# Patient Record
Sex: Female | Born: 1995 | Race: White | Hispanic: No | Smoking: Never smoker
Health system: Southern US, Community
[De-identification: ages and names within clinical notes are randomized; demographics above are authoritative.]

## PROBLEM LIST (undated history)

## (undated) DIAGNOSIS — K219 Gastro-esophageal reflux disease without esophagitis: Secondary | ICD-10-CM

## (undated) DIAGNOSIS — I81 Portal vein thrombosis: Secondary | ICD-10-CM

## (undated) DIAGNOSIS — I820 Budd-Chiari syndrome: Secondary | ICD-10-CM

## (undated) DIAGNOSIS — Z638 Other specified problems related to primary support group: Secondary | ICD-10-CM

## (undated) HISTORY — PX: BACK SURGERY: SHX140

## (undated) HISTORY — PX: SPLENECTOMY, TOTAL: SHX788

---
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
## 2014-06-12 ENCOUNTER — Emergency Department (HOSPITAL_BASED_OUTPATIENT_CLINIC_OR_DEPARTMENT_OTHER): Payer: No Typology Code available for payment source

## 2014-06-12 ENCOUNTER — Encounter (HOSPITAL_BASED_OUTPATIENT_CLINIC_OR_DEPARTMENT_OTHER): Payer: Self-pay

## 2014-06-12 ENCOUNTER — Emergency Department (HOSPITAL_BASED_OUTPATIENT_CLINIC_OR_DEPARTMENT_OTHER)
Admission: EM | Admit: 2014-06-12 | Discharge: 2014-06-12 | Disposition: A | Discharge status: Home / Self Care | Payer: No Typology Code available for payment source | Attending: Emergency Medicine | Admitting: Emergency Medicine

## 2014-06-12 DIAGNOSIS — S060X0A Concussion without loss of consciousness, initial encounter: Secondary | ICD-10-CM | POA: Diagnosis not present

## 2014-06-12 DIAGNOSIS — Z79899 Other long term (current) drug therapy: Secondary | ICD-10-CM | POA: Insufficient documentation

## 2014-06-12 DIAGNOSIS — Z3202 Encounter for pregnancy test, result negative: Secondary | ICD-10-CM | POA: Diagnosis not present

## 2014-06-12 DIAGNOSIS — Y92410 Unspecified street and highway as the place of occurrence of the external cause: Secondary | ICD-10-CM | POA: Diagnosis not present

## 2014-06-12 DIAGNOSIS — Y9389 Activity, other specified: Secondary | ICD-10-CM | POA: Insufficient documentation

## 2014-06-12 DIAGNOSIS — R05 Cough: Secondary | ICD-10-CM | POA: Diagnosis not present

## 2014-06-12 DIAGNOSIS — Y998 Other external cause status: Secondary | ICD-10-CM | POA: Insufficient documentation

## 2014-06-12 DIAGNOSIS — Z7952 Long term (current) use of systemic steroids: Secondary | ICD-10-CM | POA: Diagnosis not present

## 2014-06-12 DIAGNOSIS — Z792 Long term (current) use of antibiotics: Secondary | ICD-10-CM | POA: Insufficient documentation

## 2014-06-12 DIAGNOSIS — S0990XA Unspecified injury of head, initial encounter: Secondary | ICD-10-CM | POA: Diagnosis present

## 2014-06-12 LAB — URINALYSIS, ROUTINE W REFLEX MICROSCOPIC
BILIRUBIN URINE: NEGATIVE
GLUCOSE, UA: NEGATIVE mg/dL
HGB URINE DIPSTICK: NEGATIVE
Ketones, ur: 15 mg/dL — AB
Leukocytes, UA: NEGATIVE
Nitrite: NEGATIVE
PROTEIN: NEGATIVE mg/dL
SPECIFIC GRAVITY, URINE: 1.031 — AB (ref 1.005–1.030)
UROBILINOGEN UA: 0.2 mg/dL (ref 0.0–1.0)
pH: 5.5 (ref 5.0–8.0)

## 2014-06-12 LAB — PREGNANCY, URINE: Preg Test, Ur: NEGATIVE
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
## 2014-06-12 NOTE — Discharge Instructions (Signed)
Concussion  A concussion, or closed-head injury, is a brain injury caused by a direct blow to the head or by a quick and sudden movement (jolt) of the head or neck. Concussions are usually not life-threatening. Even so, the effects of a concussion can be serious. If you have had a concussion before, you are more likely to experience concussion-like symptoms after a direct blow to the head.   CAUSES  · Direct blow to the head, such as from running into another player during a soccer game, being hit in a fight, or hitting your head on a hard surface.  · A jolt of the head or neck that causes the brain to move back and forth inside the skull, such as in a car crash.  SIGNS AND SYMPTOMS  The signs of a concussion can be hard to notice. Early on, they may be missed by you, family members, and health care providers. You may look fine but act or feel differently.  Symptoms are usually temporary, but they may last for days, weeks, or even longer. Some symptoms may appear right away while others may not show up for hours or days. Every head injury is different. Symptoms include:  · Mild to moderate headaches that will not go away.  · A feeling of pressure inside your head.  · Having more trouble than usual:  ¨ Learning or remembering things you have heard.  ¨ Answering questions.  ¨ Paying attention or concentrating.  ¨ Organizing daily tasks.  ¨ Making decisions and solving problems.  · Slowness in thinking, acting or reacting, speaking, or reading.  · Getting lost or being easily confused.  · Feeling tired all the time or lacking energy (fatigued).  · Feeling drowsy.  · Sleep disturbances.  ¨ Sleeping more than usual.  ¨ Sleeping less than usual.  ¨ Trouble falling asleep.  ¨ Trouble sleeping (insomnia).  · Loss of balance or feeling lightheaded or dizzy.  · Nausea or vomiting.  · Numbness or tingling.  · Increased sensitivity to:  ¨ Sounds.  ¨ Lights.  ¨ Distractions.  · Vision problems or eyes that tire  easily.  · Diminished sense of taste or smell.  · Ringing in the ears.  · Mood changes such as feeling sad or anxious.  · Becoming easily irritated or angry for little or no reason.  · Lack of motivation.  · Seeing or hearing things other people do not see or hear (hallucinations).  DIAGNOSIS  Your health care provider can usually diagnose a concussion based on a description of your injury and symptoms. He or she will ask whether you passed out (lost consciousness) and whether you are having trouble remembering events that happened right before and during your injury.  Your evaluation might include:  · A brain scan to look for signs of injury to the brain. Even if the test shows no injury, you may still have a concussion.  · Blood tests to be sure other problems are not present.  TREATMENT  · Concussions are usually treated in an emergency department, in urgent care, or at a clinic. You may need to stay in the hospital overnight for further treatment.  · Tell your health care provider if you are taking any medicines, including prescription medicines, over-the-counter medicines, and natural remedies. Some medicines, such as blood thinners (anticoagulants) and aspirin, may increase the chance of complications. Also tell your health care provider whether you have had alcohol or are taking illegal drugs. This information   may affect treatment.  · Your health care provider will send you home with important instructions to follow.  · How fast you will recover from a concussion depends on many factors. These factors include how severe your concussion is, what part of your brain was injured, your age, and how healthy you were before the concussion.  · Most people with mild injuries recover fully. Recovery can take time. In general, recovery is slower in older persons. Also, persons who have had a concussion in the past or have other medical problems may find that it takes longer to recover from their current injury.  HOME  CARE INSTRUCTIONS  General Instructions  · Carefully follow the directions your health care provider gave you.  · Only take over-the-counter or prescription medicines for pain, discomfort, or fever as directed by your health care provider.  · Take only those medicines that your health care provider has approved.  · Do not drink alcohol until your health care provider says you are well enough to do so. Alcohol and certain other drugs may slow your recovery and can put you at risk of further injury.  · If it is harder than usual to remember things, write them down.  · If you are easily distracted, try to do one thing at a time. For example, do not try to watch TV while fixing dinner.  · Talk with family members or close friends when making important decisions.  · Keep all follow-up appointments. Repeated evaluation of your symptoms is recommended for your recovery.  · Watch your symptoms and tell others to do the same. Complications sometimes occur after a concussion. Older adults with a brain injury may have a higher risk of serious complications, such as a blood clot on the brain.  · Tell your teachers, school nurse, school counselor, coach, athletic trainer, or work manager about your injury, symptoms, and restrictions. Tell them about what you can or cannot do. They should watch for:  ¨ Increased problems with attention or concentration.  ¨ Increased difficulty remembering or learning new information.  ¨ Increased time needed to complete tasks or assignments.  ¨ Increased irritability or decreased ability to cope with stress.  ¨ Increased symptoms.  · Rest. Rest helps the brain to heal. Make sure you:  ¨ Get plenty of sleep at night. Avoid staying up late at night.  ¨ Keep the same bedtime hours on weekends and weekdays.  ¨ Rest during the day. Take daytime naps or rest breaks when you feel tired.  · Limit activities that require a lot of thought or concentration. These include:  ¨ Doing homework or job-related  work.  ¨ Watching TV.  ¨ Working on the computer.  · Avoid any situation where there is potential for another head injury (football, hockey, soccer, basketball, martial arts, downhill snow sports and horseback riding). Your condition will get worse every time you experience a concussion. You should avoid these activities until you are evaluated by the appropriate follow-up health care providers.  Returning To Your Regular Activities  You will need to return to your normal activities slowly, not all at once. You must give your body and brain enough time for recovery.  · Do not return to sports or other athletic activities until your health care provider tells you it is safe to do so.  · Ask your health care provider when you can drive, ride a bicycle, or operate heavy machinery. Your ability to react may be slower after a   brain injury. Never do these activities if you are dizzy.  · Ask your health care provider about when you can return to work or school.  Preventing Another Concussion  It is very important to avoid another brain injury, especially before you have recovered. In rare cases, another injury can lead to permanent brain damage, brain swelling, or death. The risk of this is greatest during the first 7-10 days after a head injury. Avoid injuries by:  · Wearing a seat belt when riding in a car.  · Drinking alcohol only in moderation.  · Wearing a helmet when biking, skiing, skateboarding, skating, or doing similar activities.  · Avoiding activities that could lead to a second concussion, such as contact or recreational sports, until your health care provider says it is okay.  · Taking safety measures in your home.  ¨ Remove clutter and tripping hazards from floors and stairways.  ¨ Use grab bars in bathrooms and handrails by stairs.  ¨ Place non-slip mats on floors and in bathtubs.  ¨ Improve lighting in dim areas.  SEEK MEDICAL CARE IF:  · You have increased problems paying attention or  concentrating.  · You have increased difficulty remembering or learning new information.  · You need more time to complete tasks or assignments than before.  · You have increased irritability or decreased ability to cope with stress.  · You have more symptoms than before.  Seek medical care if you have any of the following symptoms for more than 2 weeks after your injury:  · Lasting (chronic) headaches.  · Dizziness or balance problems.  · Nausea.  · Vision problems.  · Increased sensitivity to noise or light.  · Depression or mood swings.  · Anxiety or irritability.  · Memory problems.  · Difficulty concentrating or paying attention.  · Sleep problems.  · Feeling tired all the time.  SEEK IMMEDIATE MEDICAL CARE IF:  · You have severe or worsening headaches. These may be a sign of a blood clot in the brain.  · You have weakness (even if only in one hand, leg, or part of the face).  · You have numbness.  · You have decreased coordination.  · You vomit repeatedly.  · You have increased sleepiness.  · One pupil is larger than the other.  · You have convulsions.  · You have slurred speech.  · You have increased confusion. This may be a sign of a blood clot in the brain.  · You have increased restlessness, agitation, or irritability.  · You are unable to recognize people or places.  · You have neck pain.  · It is difficult to wake you up.  · You have unusual behavior changes.  · You lose consciousness.  MAKE SURE YOU:  · Understand these instructions.  · Will watch your condition.  · Will get help right away if you are not doing well or get worse.  Document Released: 06/16/2003 Document Revised: 03/31/2013 Document Reviewed: 10/16/2012  [employer]® Patient Information ©2015 [employer]. This information is not intended to replace advice given to you by your health care provider. Make sure you discuss any questions you have with your health care provider.
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
## 2014-07-09 DIAGNOSIS — I81 Portal vein thrombosis: Secondary | ICD-10-CM

## 2014-07-09 HISTORY — DX: Portal vein thrombosis: I81

## 2014-07-19 ENCOUNTER — Emergency Department (HOSPITAL_BASED_OUTPATIENT_CLINIC_OR_DEPARTMENT_OTHER): Payer: Medicaid Other

## 2014-07-19 ENCOUNTER — Other Ambulatory Visit: Payer: Self-pay | Admitting: Oncology

## 2014-07-19 ENCOUNTER — Inpatient Hospital Stay (HOSPITAL_BASED_OUTPATIENT_CLINIC_OR_DEPARTMENT_OTHER)
Admission: EM | Admit: 2014-07-19 | Discharge: 2014-08-02 | DRG: 871 | Disposition: A | Discharge status: Home / Self Care | Payer: Medicaid Other | Attending: Internal Medicine | Admitting: Internal Medicine

## 2014-07-19 ENCOUNTER — Encounter (HOSPITAL_BASED_OUTPATIENT_CLINIC_OR_DEPARTMENT_OTHER): Payer: Self-pay | Admitting: *Deleted

## 2014-07-19 DIAGNOSIS — R509 Fever, unspecified: Secondary | ICD-10-CM | POA: Diagnosis present

## 2014-07-19 DIAGNOSIS — I809 Phlebitis and thrombophlebitis of unspecified site: Secondary | ICD-10-CM | POA: Insufficient documentation

## 2014-07-19 DIAGNOSIS — E669 Obesity, unspecified: Secondary | ICD-10-CM | POA: Diagnosis present

## 2014-07-19 DIAGNOSIS — K751 Phlebitis of portal vein: Secondary | ICD-10-CM | POA: Diagnosis present

## 2014-07-19 DIAGNOSIS — R1084 Generalized abdominal pain: Secondary | ICD-10-CM | POA: Diagnosis not present

## 2014-07-19 DIAGNOSIS — E860 Dehydration: Secondary | ICD-10-CM | POA: Diagnosis present

## 2014-07-19 DIAGNOSIS — R748 Abnormal levels of other serum enzymes: Secondary | ICD-10-CM | POA: Diagnosis present

## 2014-07-19 DIAGNOSIS — I82409 Acute embolism and thrombosis of unspecified deep veins of unspecified lower extremity: Secondary | ICD-10-CM | POA: Insufficient documentation

## 2014-07-19 DIAGNOSIS — Z9109 Other allergy status, other than to drugs and biological substances: Secondary | ICD-10-CM

## 2014-07-19 DIAGNOSIS — R21 Rash and other nonspecific skin eruption: Secondary | ICD-10-CM | POA: Diagnosis not present

## 2014-07-19 DIAGNOSIS — Z9081 Acquired absence of spleen: Secondary | ICD-10-CM | POA: Diagnosis present

## 2014-07-19 DIAGNOSIS — R52 Pain, unspecified: Secondary | ICD-10-CM

## 2014-07-19 DIAGNOSIS — R599 Enlarged lymph nodes, unspecified: Secondary | ICD-10-CM | POA: Diagnosis present

## 2014-07-19 DIAGNOSIS — F22 Delusional disorders: Secondary | ICD-10-CM

## 2014-07-19 DIAGNOSIS — L299 Pruritus, unspecified: Secondary | ICD-10-CM | POA: Diagnosis not present

## 2014-07-19 DIAGNOSIS — R112 Nausea with vomiting, unspecified: Secondary | ICD-10-CM | POA: Diagnosis present

## 2014-07-19 DIAGNOSIS — N390 Urinary tract infection, site not specified: Secondary | ICD-10-CM | POA: Diagnosis not present

## 2014-07-19 DIAGNOSIS — D649 Anemia, unspecified: Secondary | ICD-10-CM | POA: Diagnosis present

## 2014-07-19 DIAGNOSIS — A419 Sepsis, unspecified organism: Principal | ICD-10-CM | POA: Diagnosis present

## 2014-07-19 DIAGNOSIS — D721 Eosinophilia: Secondary | ICD-10-CM | POA: Diagnosis not present

## 2014-07-19 DIAGNOSIS — A499 Bacterial infection, unspecified: Secondary | ICD-10-CM

## 2014-07-19 DIAGNOSIS — Z79899 Other long term (current) drug therapy: Secondary | ICD-10-CM

## 2014-07-19 DIAGNOSIS — D72829 Elevated white blood cell count, unspecified: Secondary | ICD-10-CM | POA: Diagnosis not present

## 2014-07-19 DIAGNOSIS — R7989 Other specified abnormal findings of blood chemistry: Secondary | ICD-10-CM | POA: Diagnosis present

## 2014-07-19 DIAGNOSIS — R05 Cough: Secondary | ICD-10-CM

## 2014-07-19 DIAGNOSIS — Z68.41 Body mass index (BMI) pediatric, greater than or equal to 95th percentile for age: Secondary | ICD-10-CM | POA: Diagnosis not present

## 2014-07-19 DIAGNOSIS — Z791 Long term (current) use of non-steroidal anti-inflammatories (NSAID): Secondary | ICD-10-CM | POA: Diagnosis not present

## 2014-07-19 DIAGNOSIS — B259 Cytomegaloviral disease, unspecified: Secondary | ICD-10-CM | POA: Insufficient documentation

## 2014-07-19 DIAGNOSIS — R109 Unspecified abdominal pain: Secondary | ICD-10-CM | POA: Diagnosis present

## 2014-07-19 DIAGNOSIS — I81 Portal vein thrombosis: Secondary | ICD-10-CM | POA: Diagnosis present

## 2014-07-19 DIAGNOSIS — I8290 Acute embolism and thrombosis of unspecified vein: Secondary | ICD-10-CM | POA: Diagnosis not present

## 2014-07-19 DIAGNOSIS — R059 Cough, unspecified: Secondary | ICD-10-CM

## 2014-07-19 DIAGNOSIS — R0781 Pleurodynia: Secondary | ICD-10-CM | POA: Diagnosis not present

## 2014-07-19 DIAGNOSIS — R51 Headache: Secondary | ICD-10-CM | POA: Diagnosis not present

## 2014-07-19 DIAGNOSIS — T368X5A Adverse effect of other systemic antibiotics, initial encounter: Secondary | ICD-10-CM | POA: Diagnosis not present

## 2014-07-19 LAB — URINALYSIS, ROUTINE W REFLEX MICROSCOPIC
Glucose, UA: NEGATIVE mg/dL
Hgb urine dipstick: NEGATIVE
Ketones, ur: 15 mg/dL — AB
NITRITE: NEGATIVE
PH: 6 (ref 5.0–8.0)
Protein, ur: NEGATIVE mg/dL
Specific Gravity, Urine: 1.03 (ref 1.005–1.030)
Urobilinogen, UA: 1 mg/dL (ref 0.0–1.0)

## 2014-07-19 LAB — PREGNANCY, URINE: PREG TEST UR: NEGATIVE

## 2014-07-19 LAB — CBC WITH DIFFERENTIAL/PLATELET
BASOS PCT: 5 % — AB (ref 0–1)
Band Neutrophils: 0 % (ref 0–10)
Basophils Absolute: 0.5 10*3/uL — ABNORMAL HIGH (ref 0.0–0.1)
Blasts: 0 %
EOS ABS: 0.5 10*3/uL (ref 0.0–0.7)
EOS PCT: 5 % (ref 0–5)
HEMATOCRIT: 38.7 % (ref 36.0–46.0)
HEMOGLOBIN: 12.9 g/dL (ref 12.0–15.0)
LYMPHS ABS: 4.6 10*3/uL — AB (ref 0.7–4.0)
Lymphocytes Relative: 43 % (ref 12–46)
MCH: 30.4 pg (ref 26.0–34.0)
MCHC: 33.3 g/dL (ref 30.0–36.0)
MCV: 91.1 fL (ref 78.0–100.0)
MONO ABS: 0.5 10*3/uL (ref 0.1–1.0)
MONOS PCT: 5 % (ref 3–12)
Metamyelocytes Relative: 0 %
Myelocytes: 0 %
NEUTROS ABS: 4.4 10*3/uL (ref 1.7–7.7)
NEUTROS PCT: 42 % — AB (ref 43–77)
NRBC: 0 /100{WBCs}
Platelets: 278 10*3/uL (ref 150–400)
Promyelocytes Absolute: 0 %
RBC: 4.25 MIL/uL (ref 3.87–5.11)
RDW: 15.4 % (ref 11.5–15.5)
WBC: 10.5 10*3/uL (ref 4.0–10.5)

## 2014-07-19 LAB — URINE MICROSCOPIC-ADD ON

## 2014-07-19 LAB — COMPREHENSIVE METABOLIC PANEL
ALT: 79 U/L — ABNORMAL HIGH (ref 0–35)
AST: 92 U/L — AB (ref 0–37)
Albumin: 3.3 g/dL — ABNORMAL LOW (ref 3.5–5.2)
Alkaline Phosphatase: 154 U/L — ABNORMAL HIGH (ref 39–117)
Anion gap: 9 (ref 5–15)
BUN: 6 mg/dL (ref 6–23)
CO2: 23 mmol/L (ref 19–32)
CREATININE: 0.66 mg/dL (ref 0.50–1.10)
Calcium: 9.1 mg/dL (ref 8.4–10.5)
Chloride: 108 mmol/L (ref 96–112)
GFR calc non Af Amer: >90 mL/min (ref >90)
GLUCOSE: 112 mg/dL — AB (ref 70–99)
POTASSIUM: 3.6 mmol/L (ref 3.5–5.1)
Sodium: 140 mmol/L (ref 135–145)
TOTAL PROTEIN: 7.1 g/dL (ref 6.0–8.3)
Total Bilirubin: 0.1 mg/dL — ABNORMAL LOW (ref 0.3–1.2)

## 2014-07-19 LAB — HEPARIN LEVEL (UNFRACTIONATED): Heparin Unfractionated: 0.57 IU/mL (ref 0.30–0.70)

## 2014-07-19 LAB — ANTITHROMBIN III: AntiThromb III Func: 85 % (ref 75–120)

## 2014-07-19 MED ORDER — NORETHINDRONE ACET-ETHINYL EST 1-20 MG-MCG PO TABS: 1.0000 | ORAL_TABLET | Freq: Every day | ORAL | Status: DC

## 2014-07-19 MED ORDER — DEXTROSE 5 % IV SOLN
1.0000 g | Freq: Once | INTRAVENOUS | Status: AC
  Administered 2014-07-19: 1 g via INTRAVENOUS

## 2014-07-19 MED ORDER — SODIUM CHLORIDE 0.9 % IV BOLUS (SEPSIS)
1000.0000 mL | Freq: Once | INTRAVENOUS | Status: AC
  Administered 2014-07-19: 1000 mL via INTRAVENOUS

## 2014-07-19 MED ORDER — ONDANSETRON HCL 4 MG/2ML IJ SOLN
4.0000 mg | Freq: Once | INTRAMUSCULAR | Status: AC
  Administered 2014-07-19: 4 mg via INTRAVENOUS
  Filled 2014-07-19: qty 2

## 2014-07-19 MED ORDER — CETYLPYRIDINIUM CHLORIDE 0.05 % MT LIQD
7.0000 mL | Freq: Two times a day (BID) | OROMUCOSAL | Status: DC
  Administered 2014-07-25 (×2): 7 mL via OROMUCOSAL

## 2014-07-19 MED ORDER — MORPHINE SULFATE 2 MG/ML IJ SOLN
1.0000 mg | INTRAMUSCULAR | Status: DC | PRN
  Administered 2014-07-20 – 2014-07-24 (×18): 2 mg via INTRAVENOUS
  Filled 2014-07-19 (×18): qty 1

## 2014-07-19 MED ORDER — ACETAMINOPHEN 325 MG PO TABS
ORAL_TABLET | ORAL | Status: AC
  Filled 2014-07-19: qty 2

## 2014-07-19 MED ORDER — ONDANSETRON HCL 4 MG/2ML IJ SOLN
4.0000 mg | Freq: Four times a day (QID) | INTRAMUSCULAR | Status: DC | PRN
  Administered 2014-07-19 – 2014-07-30 (×20): 4 mg via INTRAVENOUS
  Filled 2014-07-19 (×23): qty 2

## 2014-07-19 MED ORDER — POTASSIUM CHLORIDE IN NACL 20-0.9 MEQ/L-% IV SOLN
INTRAVENOUS | Status: DC
  Administered 2014-07-19: 23:00:00 via INTRAVENOUS
  Administered 2014-07-20: 100 mL/h via INTRAVENOUS
  Administered 2014-07-20 – 2014-07-25 (×6): via INTRAVENOUS
  Administered 2014-07-26: 50 mL/h via INTRAVENOUS
  Administered 2014-07-28 – 2014-07-31 (×5): via INTRAVENOUS
  Filled 2014-07-19 (×18): qty 1000

## 2014-07-19 MED ORDER — ACETAMINOPHEN 325 MG PO TABS
650.0000 mg | ORAL_TABLET | Freq: Once | ORAL | Status: AC
  Administered 2014-07-19: 650 mg via ORAL

## 2014-07-19 MED ORDER — CHLORHEXIDINE GLUCONATE 0.12 % MT SOLN
15.0000 mL | Freq: Two times a day (BID) | OROMUCOSAL | Status: DC
  Administered 2014-07-20 – 2014-08-01 (×6): 15 mL via OROMUCOSAL
  Filled 2014-07-19 (×11): qty 15

## 2014-07-19 MED ORDER — CEFTRIAXONE SODIUM 1 G IJ SOLR
INTRAMUSCULAR | Status: AC
  Filled 2014-07-19: qty 10

## 2014-07-19 MED ORDER — ACETAMINOPHEN 650 MG RE SUPP: 650.0000 mg | Freq: Four times a day (QID) | RECTAL | Status: DC | PRN

## 2014-07-19 MED ORDER — CEFTRIAXONE SODIUM IN DEXTROSE 20 MG/ML IV SOLN
1.0000 g | INTRAVENOUS | Status: DC
  Filled 2014-07-19: qty 50

## 2014-07-19 MED ORDER — PROMETHAZINE HCL 25 MG/ML IJ SOLN
12.5000 mg | Freq: Once | INTRAMUSCULAR | Status: AC
  Administered 2014-07-19: 12.5 mg via INTRAVENOUS
  Filled 2014-07-19: qty 1

## 2014-07-19 MED ORDER — HEPARIN BOLUS VIA INFUSION
4000.0000 [IU] | Freq: Once | INTRAVENOUS | Status: AC
  Administered 2014-07-19: 4000 [IU] via INTRAVENOUS

## 2014-07-19 MED ORDER — KETOROLAC TROMETHAMINE 30 MG/ML IJ SOLN
30.0000 mg | Freq: Once | INTRAMUSCULAR | Status: AC
  Administered 2014-07-19: 30 mg via INTRAVENOUS
  Filled 2014-07-19: qty 1

## 2014-07-19 MED ORDER — HEPARIN (PORCINE) IN NACL 100-0.45 UNIT/ML-% IJ SOLN
1200.0000 [IU]/h | INTRAMUSCULAR | Status: DC
  Administered 2014-07-19 – 2014-07-22 (×4): 1200 [IU]/h via INTRAVENOUS
  Filled 2014-07-19 (×4): qty 250

## 2014-07-19 MED ORDER — ACETAMINOPHEN 325 MG PO TABS
650.0000 mg | ORAL_TABLET | Freq: Four times a day (QID) | ORAL | Status: DC | PRN
  Administered 2014-07-20: 650 mg via ORAL
  Filled 2014-07-19: qty 2

## 2014-07-19 NOTE — ED Notes (Signed)
Family member given coffee.

## 2014-07-19 NOTE — Progress Notes (Addendum)
Pharmacy notified the RN to keep the Heparin infusing at 12 ml/hr

## 2014-07-19 NOTE — Progress Notes (Signed)
Patient refused a second IV site. RN to notify the PCP on call.

## 2014-07-19 NOTE — ED Notes (Signed)
[facility] here to transfer to [facility].

## 2014-07-19 NOTE — ED Notes (Signed)
Pt family member offered fluids.

## 2014-07-19 NOTE — ED Notes (Signed)
[facility]

## 2014-07-19 NOTE — ED Notes (Signed)
Pt back from CT

## 2014-07-19 NOTE — H&P (Addendum)
[facility] History and Physical  Carol [name] AOZ:[id number] DOB: Feb 13, 1996 DOA: 07/19/2014   PCP: [name], MD    Chief Complaint: Abdominal pain vomiting and fever  HPI: Carol [name] is a 19 y.o. 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 The patient states that she's been having 1 week of diffuse abdominal pain, vomiting and fevers. Temperature has gone up as high as 102 this morning it was 101.1. She is vomiting up mostly solid food. She is able to tolerate liquids. She has not had any hematemesis. She has not had any diarrhea. Really there is no abdominal pain present. She has not had any sick contacts. She is noted to have a positive UA-she denies hematuria dysuria and increased frequency of micturition. She also complains of some dizziness associated with the vomiting episodes and at times when she has been getting up in the morning and getting out of bed. She was noted to have elevated LFTs in the ER and therefore an ultrasound of the abdomen was done which revealed a portal vein thrombus. The ER doctor spoke with radiology, GI and hematology and the general consensus was that patient needed to be admitted for further treatment.   General: + anorexia, fever, no weight loss Cardiac: Denies chest pain, syncope, palpitations, pedal edema  Respiratory: Denies cough, shortness of breath, wheezing GI: Denies severe indigestion/heartburn, + abdominal pain, + nausea, vomiting, NO diarrhea and constipation GU: Denies hematuria, incontinence, dysuria  Musculoskeletal: Denies arthritis  Skin: Denies suspicious skin lesions Neurologic: Denies focal weakness or numbness, change in vision Psychiatry: Denies depression - has mild anxiety. Hematologic: + bruising or bleeding   History reviewed. No pertinent past medical history.  Past Surgical History  Procedure Laterality Date  . Back surgery    . Splenectomy, total      Social History: does  not smoke or drink alcohol She is a Consulting civil engineer    Allergies  Allergen Reactions  . Tape Hives    Can use paper tape    Family history - no significant medical problems in family   Prior to Admission medications   Medication Sig Start Date End Date Taking? Authorizing Provider  ibuprofen (ADVIL,MOTRIN) 800 MG tablet Take 800 mg by mouth every 6 (six) hours as needed for moderate pain (pain).    Yes Historical Provider, MD  norethindrone-ethinyl estradiol (MICROGESTIN,JUNEL,LOESTRIN) 1-20 MG-MCG tablet Take 1 tablet by mouth daily.   Yes Historical Provider, MD  promethazine (PHENERGAN) 12.5 MG tablet Take 12.5 mg by mouth every 6 (six) hours as needed for nausea or vomiting (nausea).    Yes Historical Provider, MD     Physical Exam: Filed Vitals:   07/19/14 1208 07/19/14 1454 07/19/14 1634 07/19/14 1727  BP: 131/99 117/80 122/70 139/84  Pulse: 85 74 81 92  Temp: 98.2 F (36.8 C)  98.4 F (36.9 C) 99 F (37.2 C)  TempSrc: Oral  Oral Oral  Resp: Height:     (1.499 m)  Weight:    81.6 kg (179 lb 14.3 oz)  SpO2: 100% 99% 98% 100%     General: AAO x3, no distress HEENT: Normocephalic and Atraumatic, Mucous membranes pink                PERRLA; EOM intact; No scleral icterus,                 Nares: Patent, Oropharynx: Clear, Fair Dentition  Neck: FROM, no cervical lymphadenopathy, thyromegaly, carotid bruit or JVD;  Breasts: deferred CHEST WALL: No tenderness  CHEST: Normal respiration, clear to auscultation bilaterally  HEART: Regular rate and rhythm; no murmurs rubs or gallops  BACK: No kyphosis or scoliosis; no CVA tenderness  GI: Positive Bowel Sounds, soft, mildly tender across upper abdomen; no masses, no organomegaly Rectal Exam: deferred MSK: No cyanosis, clubbing, or edema Genitalia: not examined  SKIN:  no rash or ulceration  CNS: Alert and Oriented x 4, Nonfocal exam, CN 2-12 intact  Labs on Admission:  Basic Metabolic  Panel:  Recent Labs Lab 07/19/14 0930  NA 140  K 3.6  CL 108  CO2 23  GLUCOSE 112*  BUN 6  CREATININE 0.66  CALCIUM 9.1   Liver Function Tests:  Recent Labs Lab 07/19/14 0930  AST 92*  ALT 79*  ALKPHOS 154*  BILITOT 0.1*  PROT 7.1  ALBUMIN 3.3*   No results for input(s): LIPASE, AMYLASE in the last 168 hours. No results for input(s): AMMONIA in the last 168 hours. CBC:  Recent Labs Lab 07/19/14 0930  WBC 10.5  NEUTROABS 4.4  HGB 12.9  HCT 38.7  MCV 91.1  PLT 278   Cardiac Enzymes: No results for input(s): CKTOTAL, CKMB, CKMBINDEX, TROPONINI in the last 168 hours.  BNP (last 3 results) No results for input(s): BNP in the last 8760 hours.  ProBNP (last 3 results) No results for input(s): PROBNP in the last 8760 hours.  CBG: No results for input(s): GLUCAP in the last 168 hours.  Radiological Exams on Admission: [place] Abdomen Complete  07/19/2014   CLINICAL DATA:  Generalized abdominal pain. Fever of 102 degrees. Nausea and vomiting.  EXAM: ULTRASOUND ABDOMEN COMPLETE  COMPARISON:  None.  FINDINGS: Gallbladder: No gallstones or wall thickening visualized. No sonographic Murphy sign noted. Gallbladder wall thickness is within normal limits at 2 mm.  Common bile duct: Diameter: 2 mm, within normal limits  Liver: Echogenic material is noted within the right portal vein extending to the main portal vein. There is no definite edema surrounding the vessel. There is decreased flow round the material. The portal veins are otherwise patent. The hepatic artery is patent.  IVC: No abnormality visualized.  Pancreas: Visualized portion unremarkable.  Spleen: Splenectomy  Right Kidney: Length: 10.6 cm, within normal limits. Echogenicity within normal limits. No mass or hydronephrosis visualized.  Left Kidney: Length: 10.5 cm, within normal limits. Echogenicity within normal limits. No mass or hydronephrosis visualized.  Abdominal aorta: No aneurysm visualized.  Other findings: None.   IMPRESSION: 1. Echogenic material within the right portal vein with decreased flow around this material is concerning for thrombus. Given the patient's fever, three-phase liver CT is recommended for further evaluation. 2. Normal appearance of the gallbladder. 3. Status post splenectomy   Electronically Signed   By: Marin Robertshristopher  Mattern M.D.   On: 07/19/2014 12:33   Ct Abdomen Pelvis W Contrast  07/19/2014   ADDENDUM REPORT: 07/19/2014 14:45  ADDENDUM: Recommend anti coagulation and hematology oncology consultation for workup for hypercoagulable physiology. Findings conveyed toDOUGLAS DELO on 07/19/2014 at14:44.   Electronically Signed   By: Genevive BiStewart  Edmunds M.D.   On: 07/19/2014 14:45   07/19/2014   CLINICAL DATA:  Abdominal pain all over.  100 cc Omnipaque  EXAM: CT ABDOMEN AND PELVIS WITH CONTRAST  TECHNIQUE: Multidetector CT imaging of the abdomen and pelvis was performed using the standard protocol following bolus administration of intravenous contrast.  CONTRAST:  100 Omnipaque  COMPARISON:  Ultrasound 07/19/2014  FINDINGS: Lower chest: Lung bases are clear.  Hepatobiliary: On the portal venous phase, there is a filling defect within the right main pulmonary artery (image 47, series 9). There is significant streak artifact through this region from the posterior spine fusion. The main portal vein and left portal vein are patent. Likewise the superior mesenteric vein is patent. Splenic vein is patent.  There is no focal lesion within the liver parenchyma on the arterial phase imaging or portal venous phase imaging.  Pancreas: Pancreas is normal. No ductal dilatation. No pancreatic inflammation.  Spleen: Several small lobular splenules collects within the left suprarenal location beneath the hemidiaphragm (image 31, series 4). This likely represents regenerating splenic tissue from prior splenectomy.  Adrenals/urinary tract: Adrenal glands and kidneys are normal. The ureters and bladder normal.  Stomach/Bowel:  Images  Vascular/Lymphatic: Abdominal aorta is normal caliber. There is no retroperitoneal or periportal lymphadenopathy. No pelvic lymphadenopathy.  Reproductive: Uterus and ovaries are normal.  Musculoskeletal: No aggressive osseous lesion.  Other: No free fluid.  IMPRESSION: 1. Thrombus within the right portal vein. No hepatic lesion identified. This likely represents bland thrombus. 2. Main portal vein left portal vein are patent. 3. Prior splenectomy with splenic regeneration.  Electronically Signed: By: Genevive Bi M.D. 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 Darnelle Catalan start heparin which was done at [facility]. -Hypercoagulable panel ordered as recommended by Dr. Darnelle Catalan- he also recommends reviewing the films with the radiologist to see if this is an acute or chronic thrombus- I have spoken with Dr Manson Passey of radiology who feels that since it hasn't recannulated yet, it may be acute -I have held her birth control pill for now  Active Problems:   Abdominal pain/ vomiting/fever/ mildly elevated LFTs - normal liver or gallbladder on CT and ultrasound -Not sure if this is a viral gastroenteritis or related to UTI-we'll place her on clear liquids and Zofran and when necessary Tylenol - I have ordered blood cultures however she has already received Rocephin in the ER- see below    UTI (urinary tract infection) - No urinary symptoms but reports fevers- no CVA tenderness - was given Rocephin in the ER -I have ordered a urine culture but as mentioned she has been given Rocephin already  Dehydration - start IVF    Consulted: Oncology-Dr. Darnelle Catalan   Code Status: Full code  Family Communication: None  DVT Prophylaxis: Heparin infusion  Time spent: 50 minutes  Sharief Wainwright, MD [facility]  If 7PM-7AM, please contact night-coverage [email or web address] 07/19/2014, 6:28 PM

## 2014-07-19 NOTE — ED Notes (Signed)
Report given to JC with [facility].  

## 2014-07-19 NOTE — ED Notes (Signed)
C/o fever, n/v since last Monday. No diarrhea. All over abd pain. States feels a little constipated. Feels sleepy. Seen by MD on Wednesday and given phenergan.

## 2014-07-19 NOTE — ED Notes (Signed)
Called Carol [name] [er [facility] on hem

## 2014-07-19 NOTE — Progress Notes (Signed)
ANTICOAGULATION CONSULT NOTE - F/u Consult  Pharmacy Consult for heparin Indication: r/o portal vein thrombosis  Allergies  Allergen Reactions  . Tape Hives    Can use paper tape    Patient Measurements: Height: 4\' 11"  (149.9 cm) Weight: 179 lb 14.3 oz (81.6 kg) IBW/kg (Calculated) : 43.2   Vital Signs: Temp: 98.4 F (36.9 C) (04/11 2113) Temp Source: Oral (04/11 2113) BP: 109/70 mmHg (04/11 2113) Pulse Rate: 87 (04/11 2113)  Labs:  Recent Labs  07/19/14 0930 07/19/14 2145  HGB 12.9  --   HCT 38.7  --   PLT 278  --   HEPARINUNFRC  --  0.57  CREATININE 0.66  --     Estimated Creatinine Clearance: 104.6 mL/min (by C-G formula based on Cr of 0.66).   Medical History: History reviewed. No pertinent past medical history.  Medications:  See medication history  Assessment: 19 yo with h/o splenectomy to start heparin r/o portal vein thrombosis. 4/11 2145 HL= 0.57 units/ml, no problems per RN Goal of Therapy:  Heparin level 0.3-0.7 units/ml Monitor platelets by anticoagulation protocol: Yes   Plan:   Continue Heparin @ 1200 units/hr  Recheck HL with am labs  Monitor for bleeding  Lorenza EvangelistGreen, Mannie Ohlin R 07/19/2014,10:54 PM

## 2014-07-19 NOTE — ED Notes (Signed)
Patient transported to CT 

## 2014-07-19 NOTE — Progress Notes (Signed)
ANTICOAGULATION CONSULT NOTE - Initial Consult  Pharmacy Consult for heparin Indication: r/o portal vein thrombosis  Allergies  Allergen Reactions  . Tape Hives    Can use paper tape    Patient Measurements: Height: 4\' 11"  (149.9 cm) Weight: 170 lb (77.111 kg) IBW/kg (Calculated) : 43.2   Vital Signs: Temp: 98.2 F (36.8 C) (04/11 1208) Temp Source: Oral (04/11 1208) BP: 117/80 mmHg (04/11 1454) Pulse Rate: 74 (04/11 1454)  Labs:  Recent Labs  07/19/14 0930  HGB 12.9  HCT 38.7  PLT 278  CREATININE 0.66    Estimated Creatinine Clearance: 101.4 mL/min (by C-G formula based on Cr of 0.66).   Medical History: History reviewed. No pertinent past medical history.  Medications:  See medication history  Assessment: 19 yo with h/o splenectomy to start heparin r/o portal vein thrombosis. Goal of Therapy:  Heparin level 0.3-0.7 units/ml Monitor platelets by anticoagulation protocol: Yes   Plan:  Heparin 4000 unit bolus and drip at 1250 units/hr Check heparin level 6 hours after start Monitor for bleeding.  Talbert CageSeay, Wendelyn Kiesling Poteet 07/19/2014,3:26 PM

## 2014-07-19 NOTE — Progress Notes (Signed)
Pharmacy was notified to verify the compatibility of NS+KCL 7520mEq/L and the Heparin Infusion. Apparently the 2 are compatible.

## 2014-07-19 NOTE — ED Provider Notes (Signed)
CSN: [id number]     Arrival date & time 07/19/14  0845 History   First MD Initiated Contact with Patient 07/19/14 [phone]     Chief Complaint  Patient presents with  . Fever     (Consider location/radiation/quality/duration/timing/severity/associated sxs/prior Treatment) HPI Comments: Patient is a 19 year old female with past medical history of prior splenectomy due to trauma at age 89. She presents today for evaluation of fever, nausea, and vomiting that has been occurring intermittently for the past week. She reports generalized abdominal cramping, but no focal discomfort. She denies diarrhea. She denies bloody stool.  Patient is a 19 y.o. female presenting with fever. The history is provided by the patient.  Fever Temp source:  Oral Severity:  Moderate Onset quality:  Gradual Duration:  7 days Timing:  Intermittent Progression:  Unchanged Chronicity:  New Relieved by:  Nothing Worsened by:  Nothing tried Ineffective treatments:  Acetaminophen Associated symptoms: chills, nausea and vomiting   Associated symptoms: no congestion, no cough and no diarrhea     History reviewed. No pertinent past medical history. Past Surgical History  Procedure Laterality Date  . Back surgery    . Splenectomy, total     No family history on file. History  Substance Use Topics  . Smoking status: Never Smoker   . Smokeless tobacco: Not on file  . Alcohol Use: No   OB History    No data available     Review of Systems  Constitutional: Positive for fever and chills.  HENT: Negative for congestion.   Respiratory: Negative for cough.   Gastrointestinal: Positive for nausea and vomiting. Negative for diarrhea.  All other systems reviewed and are negative.     Allergies  Tape  Home Medications   Prior to Admission medications   Medication Sig Start Date End Date Taking? Authorizing Provider  ibuprofen (ADVIL,MOTRIN) 800 MG tablet Take 800 mg by mouth every 8 (eight) hours as needed.    Yes Historical Provider, MD  promethazine (PHENERGAN) 12.5 MG tablet Take 12.5 mg by mouth every 6 (six) hours as needed for nausea or vomiting.   Yes Historical Provider, MD   BP 137/85 mmHg  Pulse 115  Temp(Src) 99.3 F (37.4 C) (Oral)  Resp 16  Ht 4\' 11"  (1.499 m)  Wt 170 lb (77.111 kg)  BMI 34.32 kg/m2  SpO2 97%  LMP 12/18/2013 Physical Exam  Constitutional: She is oriented to person, place, and time. She appears well-developed and well-nourished. No distress.  HENT:  Head: Normocephalic and atraumatic.  Mouth/Throat: Oropharynx is clear and moist.  Neck: Normal range of motion. Neck supple.  Cardiovascular: Normal rate and regular rhythm.  Exam reveals no gallop and no friction rub.   No murmur heard. Pulmonary/Chest: Effort normal and breath sounds normal. No respiratory distress. She has no wheezes. She has no rales.  Abdominal: Soft. Bowel sounds are normal. She exhibits no distension. There is no tenderness.  Musculoskeletal: Normal range of motion. She exhibits no edema.  Neurological: She is alert and oriented to person, place, and time.  Skin: Skin is warm and dry. She is not diaphoretic.  Nursing note and vitals reviewed.   ED Course  Procedures (including critical care time) Labs Review Labs Reviewed  COMPREHENSIVE METABOLIC PANEL  CBC WITH DIFFERENTIAL/PLATELET  URINALYSIS, ROUTINE W REFLEX MICROSCOPIC  PREGNANCY, URINE    Imaging Review No results found.   EKG Interpretation None      MDM   Final diagnoses:  None    Patient  presents with symptoms consistent with a viral gastroenteritis. Workup reveals mildly elevated LFTs. For this reason an ultrasound was obtained of the abdomen which revealed no evidence for gallbladder disease, however did suggest a portal vein thrombosis. Radiology recommended a CT scan to confirm this. This was done and did confirm a clot. I've discussed these findings with Dr. Russella Dar from GI, Dr. Darnelle Catalan from hematology, and  the interventional radiologist who will agree to admission for anticoagulation and workup of a hypercoagulable state is indicated. I've spoken with Dr. Butler Denmark from the hospitalist service who agrees to admit. Will be started on heparin. She also has findings in her urine consistent with a UTI. Will be given Rocephin for this. She will be transferred to [facility] long for admission.    Geoffery Lyons, MD 07/19/14 [phone]

## 2014-07-19 NOTE — Consult Note (Signed)
[facility]  [phone] [phone]     ID: Carol [name] DOB: 07/26/1995  MR#: [id number]  CSN#:[id number]  Patient Care Team: Betti Reese, MD as PCP - General (Family Medicine) PCP: REESE,BETTI D, MD GYN: SU:  OTHER MD:  CHIEF COMPLAINT: Right portal vein clot  CURRENT TREATMENT: heparin   HISTORY OF PRESENT ILLNESS:  "Carol [name]" developed nausea and abdominal discomfort 07/09/2014. She felt better the next 2 days, then on 07/12/2014 again experienced vomiting, and now also temperatures up to 103 degrees. She saw her PCP 07/14/2014 and was started on prochlorperazine, with little effect. She was also on ibuprofen for the abdominal pain. The fevers persisted and this AM took her to the ED where a urinalysis was obtained, showing mod leukocytes. An abd US showed a normal GB but echogenic material in the R porta; v suggestive of thrombus. This was confirmed with CT of the abd and pelvis showing a filling defect in the R portal v. There were no other abnormalities other than the absence of a spleen--the pt being s/p traumatic splenectomy age 3. The patient was then transferred to [facility] for further evaluation  INTERVAL HISTORY: I met with Carol [name] and her mother in her hospital room 07/19/2014  REVIEW OF SYSTEMS: Currently she feels hot. She c/o slight nausea-- just received some zofran. She has a mild h/a and her whole body hurts from lying around all day (did not have arthralgias/myalgias before). She still has some abdominal discomfort but mild. Bowel movements have been less frequent since "all this" started but otherwise normal. Denies dysuria or hematuria. A detailed ROS was otherwise negative  PAST MEDICAL HISTORY: History reviewed. No pertinent past medical history.  PAST SURGICAL HISTORY: Past Surgical History  Procedure Laterality Date  . Back surgery    . Splenectomy, total      FAMILY HISTORY No family history on file.  The patient's  maternal grqndfather had a LE-DVT in his 50's, after a fall. He died of an MI age 54. The aptient's maternal grandmother is alove, age 70, s/p multiple strokes (not a smoker). The patient's mother has no Hx of DVT. The patient knows little about her biological father. The patient has one brother and one sister, in good health  GYNECOLOGIC HISTORY:  Patient's last menstrual period was 12/18/2013. GX P0. Was on orac contraceptives until this admission, started September 2015  SOCIAL HISTORY:  Aby lives with her mother Jody Wilson and her stepfatehr Mike Wilson. Jody is a hairdresser and Mike works for [employer]. Carol [name] is ging to cosmetology school.    ADVANCED DIRECTIVES: not in place   HEALTH MAINTENANCE: History  Substance Use Topics  . Smoking status: Never Smoker   . Smokeless tobacco: Not on file  . Alcohol Use: No     Colonoscopy:  PAP:  Bone density:  Lipid panel:  Allergies  Allergen Reactions  . Tape Hives    Can use paper tape    Current Facility-Administered Medications  Medication Dose Route Frequency Provider Last Rate Last Dose  . 0.9 % NaCl with KCl 20 mEq/ L  infusion   Intravenous Continuous Saima Rizwan, MD      . acetaminophen (TYLENOL) tablet 650 mg  650 mg Oral Q6H PRN Saima Rizwan, MD       Or  . acetaminophen (TYLENOL) suppository 650 mg  650 mg Rectal Q6H PRN Saima Rizwan, MD      . [START ON 07/20/2014] antiseptic oral rinse (CPC / CETYLPYRIDINIUM CHLORIDE 0.05%)   solution 7 mL  7 mL Mouth Rinse q12n4p Saima Rizwan, MD      . [START ON 07/20/2014] cefTRIAXone (ROCEPHIN) 1 g in dextrose 5 % 50 mL IVPB - Premix  1 g Intravenous Q24H Saima Rizwan, MD      . chlorhexidine (PERIDEX) 0.12 % solution 15 mL  15 mL Mouth Rinse BID Saima Rizwan, MD      . heparin ADULT infusion 100 units/mL (25000 units/250 mL)  1,200 Units/hr Intravenous Continuous Saima Rizwan, MD 12 mL/hr at 07/19/14 1531 1,200 Units/hr at 07/19/14 1531  . morphine 2 MG/ML injection 1-2 mg  1-2 mg  Intravenous Q4H PRN Saima Rizwan, MD      . ondansetron (ZOFRAN) injection 4 mg  4 mg Intravenous Q6H PRN Saima Rizwan, MD   4 mg at 07/19/14 2002    OBJECTIVE: young White female examined in bed Filed Vitals:   07/19/14 1727  BP: 139/84  Pulse: 92  Temp: 99 F (37.2 C)  Resp: 18     Body mass index is 36.32 kg/(m^2).    ECOG FS:1 - Symptomatic but completely ambulatory  Ocular: Sclerae unicteric, EOMs intact Ear-nose-throat: Oropharynx clear and moist Lymphatic: No cervical or supraclavicular adenopathy Lungs no rales or rhonchi Heart regular rate and rhythm Abd soft, minimally tender, positive bowel sounds Neuro: non-focal, well-oriented, positive affect Breasts: deferred   LAB RESULTS: Hypercoagulable panel and JAK 2 mutation results pending  CMP     Component Value Date/Time   NA 140 07/19/2014 0930   K 3.6 07/19/2014 0930   CL 108 07/19/2014 0930   CO2 23 07/19/2014 0930   GLUCOSE 112* 07/19/2014 0930   BUN 6 07/19/2014 0930   CREATININE 0.66 07/19/2014 0930   CALCIUM 9.1 07/19/2014 0930   PROT 7.1 07/19/2014 0930   ALBUMIN 3.3* 07/19/2014 0930   AST 92* 07/19/2014 0930   ALT 79* 07/19/2014 0930   ALKPHOS 154* 07/19/2014 0930   BILITOT 0.1* 07/19/2014 0930   GFRNONAA >90 07/19/2014 0930   GFRAA >90 07/19/2014 0930    INo results found for: SPEP, UPEP  Lab Results  Component Value Date   WBC 10.5 07/19/2014   NEUTROABS 4.4 07/19/2014   HGB 12.9 07/19/2014   HCT 38.7 07/19/2014   MCV 91.1 07/19/2014   PLT 278 07/19/2014    @LASTCHEMISTRY@  No results found for: LABCA2  No components found for: LABCA125  No results for input(s): INR in the last 168 hours.  Urinalysis    Component Value Date/Time   COLORURINE AMBER* 07/19/2014 0945   APPEARANCEUR CLOUDY* 07/19/2014 0945   LABSPEC 1.030 07/19/2014 0945   PHURINE 6.0 07/19/2014 0945   GLUCOSEU NEGATIVE 07/19/2014 0945   HGBUR NEGATIVE 07/19/2014 0945   BILIRUBINUR SMALL* 07/19/2014 0945    KETONESUR 15* 07/19/2014 0945   PROTEINUR NEGATIVE 07/19/2014 0945   UROBILINOGEN 1.0 07/19/2014 0945   NITRITE NEGATIVE 07/19/2014 0945   LEUKOCYTESUR MODERATE* 07/19/2014 0945    STUDIES: Us Abdomen Complete  07/19/2014   CLINICAL DATA:  Generalized abdominal pain. Fever of 102 degrees. Nausea and vomiting.  EXAM: ULTRASOUND ABDOMEN COMPLETE  COMPARISON:  None.  FINDINGS: Gallbladder: No gallstones or wall thickening visualized. No sonographic Murphy sign noted. Gallbladder wall thickness is within normal limits at 2 mm.  Common bile duct: Diameter: 2 mm, within normal limits  Liver: Echogenic material is noted within the right portal vein extending to the main portal vein. There is no definite edema surrounding the vessel. There is decreased flow   round the material. The portal veins are otherwise patent. The hepatic artery is patent.  IVC: No abnormality visualized.  Pancreas: Visualized portion unremarkable.  Spleen: Splenectomy  Right Kidney: Length: 10.6 cm, within normal limits. Echogenicity within normal limits. No mass or hydronephrosis visualized.  Left Kidney: Length: 10.5 cm, within normal limits. Echogenicity within normal limits. No mass or hydronephrosis visualized.  Abdominal aorta: No aneurysm visualized.  Other findings: None.  IMPRESSION: 1. Echogenic material within the right portal vein with decreased flow around this material is concerning for thrombus. Given the patient's fever, three-phase liver CT is recommended for further evaluation. 2. Normal appearance of the gallbladder. 3. Status post splenectomy   Electronically Signed   By: Christopher  Mattern M.D.   On: 07/19/2014 12:33   Ct Abdomen Pelvis W Contrast  07/19/2014   ADDENDUM REPORT: 07/19/2014 14:45  ADDENDUM: Recommend anti coagulation and hematology oncology consultation for workup for hypercoagulable physiology. Findings conveyed toDOUGLAS DELO on 07/19/2014 at14:44.   Electronically Signed   By: Stewart  Edmunds M.D.    On: 07/19/2014 14:45   07/19/2014   CLINICAL DATA:  Abdominal pain all over.  100 cc Omnipaque  EXAM: CT ABDOMEN AND PELVIS WITH CONTRAST  TECHNIQUE: Multidetector CT imaging of the abdomen and pelvis was performed using the standard protocol following bolus administration of intravenous contrast.  CONTRAST:  100 Omnipaque  COMPARISON:  Ultrasound 07/19/2014  FINDINGS: Lower chest: Lung bases are clear.  Hepatobiliary: On the portal venous phase, there is a filling defect within the right main pulmonary artery (image 47, series 9). There is significant streak artifact through this region from the posterior spine fusion. The main portal vein and left portal vein are patent. Likewise the superior mesenteric vein is patent. Splenic vein is patent.  There is no focal lesion within the liver parenchyma on the arterial phase imaging or portal venous phase imaging.  Pancreas: Pancreas is normal. No ductal dilatation. No pancreatic inflammation.  Spleen: Several small lobular splenules collects within the left suprarenal location beneath the hemidiaphragm (image 31, series 4). This likely represents regenerating splenic tissue from prior splenectomy.  Adrenals/urinary tract: Adrenal glands and kidneys are normal. The ureters and bladder normal.  Stomach/Bowel: Images  Vascular/Lymphatic: Abdominal aorta is normal caliber. There is no retroperitoneal or periportal lymphadenopathy. No pelvic lymphadenopathy.  Reproductive: Uterus and ovaries are normal.  Musculoskeletal: No aggressive osseous lesion.  Other: No free fluid.  IMPRESSION: 1. Thrombus within the right portal vein. No hepatic lesion identified. This likely represents bland thrombus. 2. Main portal vein left portal vein are patent. 3. Prior splenectomy with splenic regeneration.  Electronically Signed: By: Stewart  Edmunds M.D. On: 07/19/2014 14:12    ASSESSMENT: 18 y.o. s/p remote traumatic splenectomy age 3, now with Right portal v thrombus found in course of  evaluation for a febrile illness accompanied by nausea, vomiting and abdominal pain  DISCUSSION: if we could be sure this was an old clot, possbily related to her remote splenectomy surgery, no treatment would be required. However, this developed in the setting of 10 days of fever and abdominal symptoms for which we have no other explanation. Radiology notes the absence of recannulation. Accordingly we should treat as an acute clot.  There are multiple choices, but in particular I would consider dabigratan/ Pradaxa vs rivaroxaban/Xarelto. Either would be easier to deal with than coumadin or lovenox. The advantage of Pradaxa is that we now have a reversing agent-- we don;t yet have that for the other direct   thrombin or Xa inhibitors. However, labelling suggests 5 days of parenteral anticoagulation (can be lovenox) and it is given BID, which can be a problem with compliance. Xarelto can be started w/o heparin pretreatment and after the first 21 days becomes once-a-day. I did not discuss this in detail with Michelina tonight. If she does not have a clear preference based on tha above, I would opt for rivaroxaban/Xarelto because of the ease of compliance.  PLAN: (1) would treat as an acute clot, with 6 months of anticoagulation planned, subject to revision after results of hypercoagulable workup become available  (2) splenectomized patiens are at risk of fulminant infections with encapsulated organisms: patient tells me she had her last "triple vaccine" 3 years ago, which is adequate  (3) advised the patient to avoid oral contraceptives and consider IUDs or other contraceptive methods; she will need gynecology referral to optimize this  Will follow with you. If she starts Xarelto i would see her in our office after 3 weeks to switch to daily dosing.  MAGRINAT,GUSTAV C, MD   07/19/2014 8:42 PM Medical Oncology and Hematology [facility] [address] [place], [place] [address] Tel.  [phone]    Fax. [phone]  BLAND THROMBUS:  Onofrio A. Catalano, MD, Garry Choy, MD, Andrew Zhu, MD, Peter F. Hahn, MD, PhD, and Dushyant V. Sahani, MD   Differentiation of Malignant Thrombus from Bland Thrombus of the Portal Vein in Patients with Hepatocellular Carcinoma: Application of Diffusion-weighted MR Imaging   1From the Department of Abdominal and Interventional Radiology ([facility] and Cancer Center ([facility], [address], [address], [place] [address].  A neoplastic thrombus of the portal vein is found in 6.5%-44% of patients with hepatocellular carcinoma (HCC). Presence of neoplastic thrombus serves as an important determinant of tumor staging, as well as prognosis, and influences treatment selection (1-5). HCC invasion into the portal vein renders a patient unsuitable for aggressive treatment approaches such as surgical resection, orthotopic liver transplantation, or chemoembolization, due the unusually high incidence of tumor recurrence and dismal survival associated with this finding (3,4). Bland thrombus occurs in patients with and those without malignant disease. Bland thrombus occurs in 4.5%-26% of patients with chronic liver disease (6,7) and in 42% of patients with HCC. In addition, bland and tumor thrombi can be coexistent (6-8). Therefore, the detection of portal vein thrombus and accurate differentiation of bland from neoplastic thrombus are crucial for patient treatment. The reference standard for characterizing portal vein thrombus is histopathologic examination. However, in clinical practice, diagnostic imaging such as Doppler ultrasonography (US), contrast material-enhanced US, contrast-enhanced computed tomography (CT), and contrast-enhanced magnetic resonance (MR) imaging, together with clinical and laboratory findings, are often relied upon for thrombus discrimination (5,9-12). Recent reports have highlighted the  potential of diffusion-weighted (DW) imaging to differentiate benign from malignant liver lesions. Malignant liver tumors, by virtue of increased cellular density and altered nuclear-to-cytoplasmic ratio, exhibit restricted water molecule diffusion and reduced apparent diffusion coefficients (ADCs) when compared with benign lesions (13-22). However, some overlap in appearance of benign and malignant lesions has also been reported on DW images (23).  

## 2014-07-19 NOTE — Progress Notes (Signed)
RN attempted to start a second PIV site for continuous fluids with KCL. Unsuccessful. Patient is very anxious about IV sticks. IV team consult order was entered to see if IV team can assess for a second site.

## 2014-07-19 NOTE — ED Notes (Signed)
[facility] to [facility]. Left contact # with Diplomatic Services operational officersecretary.

## 2014-07-19 NOTE — Plan of Care (Signed)
19 y/o female.  H/o splenectomy at age 89. Presented with upper abdominal cramping and vomiting. LFTs elevated. Ultrasound suggested possibility of portal vein thrombosis. CT revealed thrombus. Would like admission for anticoagulation and hypercoagulable work up. ER has started heparin based upon discussion with Dr Darnelle CatalanMagrinat.  They have also given her Rocephin for pyuria- per history she is not symptomatic.

## 2014-07-20 ENCOUNTER — Inpatient Hospital Stay (HOSPITAL_COMMUNITY): Payer: Medicaid Other

## 2014-07-20 ENCOUNTER — Telehealth: Payer: Self-pay | Admitting: Oncology

## 2014-07-20 ENCOUNTER — Other Ambulatory Visit: Payer: Self-pay | Admitting: Oncology

## 2014-07-20 DIAGNOSIS — D72829 Elevated white blood cell count, unspecified: Secondary | ICD-10-CM

## 2014-07-20 DIAGNOSIS — A419 Sepsis, unspecified organism: Secondary | ICD-10-CM | POA: Diagnosis present

## 2014-07-20 DIAGNOSIS — R112 Nausea with vomiting, unspecified: Secondary | ICD-10-CM

## 2014-07-20 DIAGNOSIS — IMO0001 Reserved for inherently not codable concepts without codable children: Secondary | ICD-10-CM | POA: Insufficient documentation

## 2014-07-20 LAB — CBC
HEMATOCRIT: 33.9 % — AB (ref 36.0–46.0)
Hemoglobin: 11.3 g/dL — ABNORMAL LOW (ref 12.0–15.0)
MCH: 30.9 pg (ref 26.0–34.0)
MCHC: 33.3 g/dL (ref 30.0–36.0)
MCV: 92.6 fL (ref 78.0–100.0)
Platelets: 264 10*3/uL (ref 150–400)
RBC: 3.66 MIL/uL — ABNORMAL LOW (ref 3.87–5.11)
RDW: 15.4 % (ref 11.5–15.5)
WBC: 11.8 10*3/uL — AB (ref 4.0–10.5)

## 2014-07-20 LAB — COMPREHENSIVE METABOLIC PANEL
ALBUMIN: 2.9 g/dL — AB (ref 3.5–5.2)
ALT: 73 U/L — AB (ref 0–35)
AST: 80 U/L — ABNORMAL HIGH (ref 0–37)
Alkaline Phosphatase: 133 U/L — ABNORMAL HIGH (ref 39–117)
Anion gap: 8 (ref 5–15)
BILIRUBIN TOTAL: 0.3 mg/dL (ref 0.3–1.2)
BUN: 5 mg/dL — AB (ref 6–23)
CO2: 22 mmol/L (ref 19–32)
Calcium: 7.9 mg/dL — ABNORMAL LOW (ref 8.4–10.5)
Chloride: 107 mmol/L (ref 96–112)
Creatinine, Ser: 0.72 mg/dL (ref 0.50–1.10)
GFR calc Af Amer: >90 mL/min (ref >90)
GFR calc non Af Amer: >90 mL/min (ref >90)
Glucose, Bld: 112 mg/dL — ABNORMAL HIGH (ref 70–99)
POTASSIUM: 3.6 mmol/L (ref 3.5–5.1)
Sodium: 137 mmol/L (ref 135–145)
TOTAL PROTEIN: 6.3 g/dL (ref 6.0–8.3)

## 2014-07-20 LAB — HEPARIN LEVEL (UNFRACTIONATED): Heparin Unfractionated: 0.48 IU/mL (ref 0.30–0.70)

## 2014-07-20 LAB — LACTIC ACID, PLASMA
Lactic Acid, Venous: 2.2 mmol/L (ref 0.5–2.0)
Lactic Acid, Venous: 2.2 mmol/L (ref 0.5–2.0)

## 2014-07-20 LAB — APTT: aPTT: 69 seconds — ABNORMAL HIGH (ref 24–37)

## 2014-07-20 LAB — PROCALCITONIN: Procalcitonin: 0.11 ng/mL

## 2014-07-20 MED ORDER — VANCOMYCIN HCL IN DEXTROSE 1-5 GM/200ML-% IV SOLN
1000.0000 mg | Freq: Three times a day (TID) | INTRAVENOUS | Status: DC
  Administered 2014-07-20 – 2014-07-21 (×3): 1000 mg via INTRAVENOUS
  Filled 2014-07-20 (×4): qty 200

## 2014-07-20 MED ORDER — DIPHENHYDRAMINE HCL 25 MG PO CAPS
25.0000 mg | ORAL_CAPSULE | Freq: Four times a day (QID) | ORAL | Status: DC | PRN
  Administered 2014-07-21 – 2014-07-28 (×12): 25 mg via ORAL
  Filled 2014-07-20 (×12): qty 1

## 2014-07-20 MED ORDER — DIPHENHYDRAMINE HCL 50 MG PO CAPS: 50.0000 mg | ORAL_CAPSULE | Freq: Four times a day (QID) | ORAL | Status: DC | PRN

## 2014-07-20 MED ORDER — DIPHENHYDRAMINE HCL 50 MG PO CAPS
50.0000 mg | ORAL_CAPSULE | Freq: Once | ORAL | Status: AC
  Administered 2014-07-20: 50 mg via ORAL
  Filled 2014-07-20: qty 1

## 2014-07-20 MED ORDER — CEFEPIME HCL 1 G IJ SOLR
1.0000 g | Freq: Three times a day (TID) | INTRAMUSCULAR | Status: DC
  Administered 2014-07-20 – 2014-07-21 (×3): 1 g via INTRAVENOUS
  Filled 2014-07-20 (×4): qty 1

## 2014-07-20 MED ORDER — ACETAMINOPHEN 500 MG PO TABS
1000.0000 mg | ORAL_TABLET | Freq: Two times a day (BID) | ORAL | Status: DC
  Administered 2014-07-20: 1000 mg via ORAL
  Filled 2014-07-20 (×2): qty 2

## 2014-07-20 MED ORDER — ACETAMINOPHEN 325 MG PO TABS
650.0000 mg | ORAL_TABLET | Freq: Four times a day (QID) | ORAL | Status: DC | PRN
  Administered 2014-07-20 – 2014-07-30 (×19): 650 mg via ORAL
  Filled 2014-07-20 (×20): qty 2

## 2014-07-20 MED ORDER — GABAPENTIN 100 MG PO CAPS
100.0000 mg | ORAL_CAPSULE | Freq: Three times a day (TID) | ORAL | Status: DC
  Administered 2014-07-20 – 2014-07-28 (×23): 100 mg via ORAL
  Filled 2014-07-20 (×23): qty 1

## 2014-07-20 MED ORDER — GABAPENTIN 300 MG PO CAPS
300.0000 mg | ORAL_CAPSULE | Freq: Every day | ORAL | Status: DC
  Administered 2014-07-20 – 2014-07-27 (×8): 300 mg via ORAL
  Filled 2014-07-20 (×10): qty 1

## 2014-07-20 NOTE — Progress Notes (Signed)
Cordelia Pochebigail VanMeter   DOB:13-May-1995   BM#:[id number]   [id number]#:[id number]#:[id number]  Subjective: terrible night, woken up multiple times; had nl BM, no bleeding; ambulating halls some; still has abdominal pain; mother and friend in room   Objective: young White woman examined in bed Filed Vitals:   07/20/14 1040  BP:   Pulse:   Temp: 97.8 F (36.6 C)  Resp:     Body mass index is 36.32 kg/(m^2).  Intake/Output Summary (Last 24 hours) at 07/20/14 1253 Last data filed at 07/20/14 1002  Gross per 24 hour  Intake 1342.13 ml  Output   1025 ml  Net 317.13 ml     CBG (last 3)  No results for input(s): GLUCAP in the last 72 hours.   Labs:  Lab Results  Component Value Date   WBC 11.8* 07/20/2014   HGB 11.3* 07/20/2014   HCT 33.9* 07/20/2014   MCV 92.6 07/20/2014   PLT 264 07/20/2014   NEUTROABS 4.4 07/19/2014    @LASTCHEMISTRY @  Urine Studies No results for input(s): UHGB, CRYS in the last 72 hours.  Invalid input(s): UACOL, UAPR, USPG, UPH, UTP, UGL, UKET, UBIL, UNIT, UROB, ULEU, UEPI, UWBC, URBC, UBAC, CAST, UCOM, BILUA  Basic Metabolic Panel:  Recent Labs Lab 07/19/14 0930 07/20/14 0442  NA 140 137  K 3.6 3.6  CL 108 107  CO2 23 22  GLUCOSE 112* 112*  BUN 6 5*  CREATININE 0.66 0.72  CALCIUM 9.1 7.9*   GFR Estimated Creatinine Clearance: 104.6 mL/min (by C-G formula based on Cr of 0.72). Liver Function Tests:  Recent Labs Lab 07/19/14 0930 07/20/14 0442  AST 92* 80*  ALT 79* 73*  ALKPHOS 154* 133*  BILITOT 0.1* 0.3  PROT 7.1 6.3  ALBUMIN 3.3* 2.9*   No results for input(s): LIPASE, AMYLASE in the last 168 hours. No results for input(s): AMMONIA in the last 168 hours. Coagulation profile No results for input(s): INR, PROTIME in the last 168 hours.  CBC:  Recent Labs Lab 07/19/14 0930 07/20/14 0442  WBC 10.5 11.8*  NEUTROABS 4.4  --   HGB 12.9 11.3*  HCT 38.7 33.9*  MCV 91.1 92.6  PLT 278 264   Cardiac Enzymes: No results for input(s): CKTOTAL,  CKMB, CKMBINDEX, TROPONINI in the last 168 hours. BNP: Invalid input(s): POCBNP CBG: No results for input(s): GLUCAP in the last 168 hours. D-Dimer No results for input(s): DDIMER in the last 72 hours. Hgb A1c No results for input(s): HGBA1C in the last 72 hours. Lipid Profile No results for input(s): CHOL, HDL, LDLCALC, TRIG, CHOLHDL, LDLDIRECT in the last 72 hours. Thyroid function studies No results for input(s): TSH, T4TOTAL, T3FREE, THYROIDAB in the last 72 hours.  Invalid input(s): FREET3 Anemia work up No results for input(s): VITAMINB12, FOLATE, FERRITIN, TIBC, IRON, RETICCTPCT in the last 72 hours. Microbiology No results found for this or any previous visit (from the past 240 hour(s)).    Studies:  [place] Abdomen Complete  07/19/2014   CLINICAL DATA:  Generalized abdominal pain. Fever of 102 degrees. Nausea and vomiting.  EXAM: ULTRASOUND ABDOMEN COMPLETE  COMPARISON:  None.  FINDINGS: Gallbladder: No gallstones or wall thickening visualized. No sonographic Murphy sign noted. Gallbladder wall thickness is within normal limits at 2 mm.  Common bile duct: Diameter: 2 mm, within normal limits  Liver: Echogenic material is noted within the right portal vein extending to the main portal vein. There is no definite edema surrounding the vessel. There is decreased flow round the material. The  portal veins are otherwise patent. The hepatic artery is patent.  IVC: No abnormality visualized.  Pancreas: Visualized portion unremarkable.  Spleen: Splenectomy  Right Kidney: Length: 10.6 cm, within normal limits. Echogenicity within normal limits. No mass or hydronephrosis visualized.  Left Kidney: Length: 10.5 cm, within normal limits. Echogenicity within normal limits. No mass or hydronephrosis visualized.  Abdominal aorta: No aneurysm visualized.  Other findings: None.  IMPRESSION: 1. Echogenic material within the right portal vein with decreased flow around this material is concerning for thrombus.  Given the patient's fever, three-phase liver CT is recommended for further evaluation. 2. Normal appearance of the gallbladder. 3. Status post splenectomy   Electronically Signed   By: Marin Roberts M.D.   On: 07/19/2014 12:33   Ct Abdomen Pelvis W Contrast  07/19/2014   ADDENDUM REPORT: 07/19/2014 14:45  ADDENDUM: Recommend anti coagulation and hematology oncology consultation for workup for hypercoagulable physiology. Findings conveyed toDOUGLAS DELO on 07/19/2014 at14:44.   Electronically Signed   By: Genevive Bi M.D.   On: 07/19/2014 14:45   07/19/2014   CLINICAL DATA:  Abdominal pain all over.  100 cc Omnipaque  EXAM: CT ABDOMEN AND PELVIS WITH CONTRAST  TECHNIQUE: Multidetector CT imaging of the abdomen and pelvis was performed using the standard protocol following bolus administration of intravenous contrast.  CONTRAST:  100 Omnipaque  COMPARISON:  Ultrasound 07/19/2014  FINDINGS: Lower chest: Lung bases are clear.  Hepatobiliary: On the portal venous phase, there is a filling defect within the right main pulmonary artery (image 47, series 9). There is significant streak artifact through this region from the posterior spine fusion. The main portal vein and left portal vein are patent. Likewise the superior mesenteric vein is patent. Splenic vein is patent.  There is no focal lesion within the liver parenchyma on the arterial phase imaging or portal venous phase imaging.  Pancreas: Pancreas is normal. No ductal dilatation. No pancreatic inflammation.  Spleen: Several small lobular splenules collects within the left suprarenal location beneath the hemidiaphragm (image 31, series 4). This likely represents regenerating splenic tissue from prior splenectomy.  Adrenals/urinary tract: Adrenal glands and kidneys are normal. The ureters and bladder normal.  Stomach/Bowel: Images  Vascular/Lymphatic: Abdominal aorta is normal caliber. There is no retroperitoneal or periportal lymphadenopathy. No pelvic  lymphadenopathy.  Reproductive: Uterus and ovaries are normal.  Musculoskeletal: No aggressive osseous lesion.  Other: No free fluid.  IMPRESSION: 1. Thrombus within the right portal vein. No hepatic lesion identified. This likely represents bland thrombus. 2. Main portal vein left portal vein are patent. 3. Prior splenectomy with splenic regeneration.  Electronically Signed: By: Genevive Bi M.D. On: 07/19/2014 14:12    Assessment: 19 y.o.  s/p remote traumatic splenectomy age 17, now with Right portal v thrombus found in course of evaluation for a febrile illness accompanied by nausea, vomiting and abdominal pain  1) would treat as an acute clot, with 6 months of anticoagulation planned, subject to revision after results of hypercoagulable workup become available  (2) splenectomized patiens are at risk of fulminant infections with encapsulated organisms: patient tells me she had her last "triple vaccine" 3 years ago, which is adequate  (3) advised the patient to avoid oral contraceptives and consider IUDs or other contraceptive methods; she will need gynecology referral to optimize this  Plan:  I discussed anticoagulation options w [name] and her mother, including coumadin, lovenox, Pradaxa and Xarelto. After going over the details, she would feel most comfortable with xarelto. This is an  expensive medication and she is on medicaid. We will have to confirm that medicaid will pay for this medication before she can be discharged.  As far as pain is concerned we discussed tramadol but this has been ineffective for her. We will start tylenol and gabapentin. I would not be comfortable sending her home on narcotics. Meanwhile hepatin has an antiinflammatory effects as well as an anticoagulant one. This can help alleviate the pain if it is due to the acute PV thrombosis.  Will follow w you. Once d/c'd she will be followed at our clinic May 5 at 2:45 PM   Lowella Dell, MD 07/20/2014  12:53  PM Medical Oncology and Hematology [facility] [address] [place], [place] [address] Tel. [phone]    Fax. [phone]

## 2014-07-20 NOTE — Progress Notes (Signed)
CSW received inappropriate consult from Dr. Darnelle CatalanMagrinat to check insurance for medication coverage - RNCM, Cookie made aware.   No further CSW needs identified - CSW signing off.   Lincoln MaxinKelly Aliyyah Riese, LCSW [facility] Clinical Social Worker cell #: [phone]

## 2014-07-20 NOTE — Progress Notes (Signed)
Xarelto is preferred medication with Medicaid. Co-pay $3.00/$6.00. Thanks

## 2014-07-20 NOTE — Progress Notes (Signed)
CRITICAL VALUE ALERT  Critical value received:  Lactic acid 2.2  Date of notification:  07/20/14  Time of notification:  2019  Critical value read back:Yes.    Nurse who received alert:  Tina GriffithsValerie Jiovanni Heeter, RN  MD notified (1st page):  Tama GanderKatherine Schorr, NP  Time of first page:  2029  MD notified (2nd page): n/a  Time of second page: n/a  Responding MD:  No response, hospitalist made aware  Time MD responded: n/a

## 2014-07-20 NOTE — Progress Notes (Signed)
Patient ID: Carol [name], female   DOB: [birth year], 19 y.o.   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 Abdominal pain was cramp like, 6/10 in intensity, associated with nausea  and vomiting. She has seen PCP 07/14/2014 and was given compazine for nausea and vomiting and ibuprofen for pain but with little symptomatic relief. On admission, patient was hemodynamically stable. She was found to have elevated LFT's which prompted abd [place] study which subsequently revealed portal vein thrombosis. CT abd confirmed this finding as well. She was started on heparin and hematology has seen the patient in consultation. Her UA also showed moderate leukocytes and she was started on empiric rocephin.    Assessment/Plan:    Principal Problem: Portal vein thrombosis / Abdominal pain / nausea and vomiting / abnormal LFT's - Portal vein thrombosis seen on abd [place] and CT abd - Started heparin drip - She can be on xarelto if ok with hematology, our community clinic has samples and she can get it there if she chooses PCP there. CM can assist with this. - Continue supportive care with IV fluids, analgesia and anti-emetics as needed - LFT's still high but stable. Follow up CMP in am; if not improving we may need GI consult   Hepatic Function 07/20/2014 07/19/2014  AST 80(H) 92(H)  ALT 73(H) 79(H)  Alk Phos 133(H) 154(H)   Active Problems: Sepsis secondary to UTI (urinary tract infection) / Leukocytosis - Sepsis criteria met today 07/20/2014. Fever, tachycardia, slight tachypnea, leukocytosis. Source of infection - UTI.  - Sepsis order set placed. Follow up blood cultures, procalcitonin  level, lactic acid. - She was on Rocephin but I spoke with Dr. Megan Salon of ID who recommended broadening to cefepime, vanco considering this pt has h/o splenectomy. If still spiking fever call for an official ID consult. - CXR showed no acute findings.    DVT Prophylaxis  - on AC with heparin drip.   Code Status: Full.  Family Communication:  plan of care discussed with the patient and her mother at the bedside  Disposition Plan: needs broad spectrum abx, spiking fever, also on heparin drip for [facility] for portal vein thrombosis. Not yet stable for discharge.   IV access:  Peripheral IV  Procedures and diagnostic studies:    US Abdomen Complete 07/19/2014 1. Echogenic material within the right portal vein with decreased flow around this material is concerning for thrombus. Given the patient's fever, three-phase liver CT is recommended for further evaluation. 2. Normal appearance of the gallbladder. 3. Status post splenectomy     Ct Abdomen Pelvis W Contrast 07/19/2014   ADDENDUM REPORT: 07/19/2014 14:45  ADDENDUM: Recommend anti coagulation and hematology oncology consultation for workup for hypercoagulable physiology. 1. Thrombus within the right portal vein. No hepatic lesion identified. This likely represents bland thrombus. 2. Main portal vein left portal vein are patent. 3. Prior splenectomy with splenic regeneration.    Dg Chest Port 1 View 07/20/2014   No active disease.      Medical Consultants:  Dr. Michel Bickers, ID - phone call only   Other Consultants:  None   IAnti-Infectives:   Rocephin 07/19/2014 and 07/20/2014 Vanco 07/20/2014 -->  Cefepime 07/24/2014 -->   Leisa Lenz, MD  [facility] Pager [phone]  If 7PM-7AM, please contact night-coverage [email or web address] Password [email or web address] 07/20/2014, 4:09 PM   LOS: 1 day    HPI/Subjective: No acute overnight events. Has abdominal pain this am, 5/10 in intensity. No nausea or vomiting.   Objective: Filed Vitals:   07/20/14  0511 07/20/14 0700 07/20/14 1040 07/20/14 1406  BP: 133/70   137/81  Pulse: 106   101  Temp: 100.4 F (38 C) 98.6 F (37 C) 97.8 F (36.6 C) 101.2 F (38.4 C)  TempSrc: Oral Oral Oral Oral  Resp: 18   20  Height:      Weight:      SpO2: 97%   100%    Intake/Output Summary (Last 24 hours) at 07/20/14 1609 Last data filed at 07/20/14 1002  Gross per 24 hour  Intake 1342.13 ml  Output   1025 ml  Net 317.13 ml    Exam:   General:  Pt is alert, follows commands appropriately, not in acute distress  Cardiovascular: Regular rate and rhythm, S1/S2, no murmurs  Respiratory: Clear to auscultation bilaterally, no wheezing, no crackles, no rhonchi  Abdomen: Soft, non tender, non distended, bowel sounds present  Extremities: No edema, pulses DP and PT palpable bilaterally  Neuro: Grossly nonfocal  Data Reviewed: Basic Metabolic Panel:  Recent Labs Lab 07/19/14 0930 07/20/14 0442  NA 140 137  K 3.6 3.6  CL 108 107  CO2 23 22  GLUCOSE 112* 112*  BUN 6 5*  CREATININE 0.66 0.72  CALCIUM 9.1 7.9*   Liver Function Tests:  Recent Labs Lab 07/19/14 0930 07/20/14 0442  AST 92* 80*  ALT 79* 73*  ALKPHOS 154* 133*  BILITOT 0.1* 0.3  PROT 7.1 6.3  ALBUMIN 3.3* 2.9*   No results for input(s): LIPASE, AMYLASE in the last 168 hours. No results for input(s): AMMONIA in the last 168 hours. CBC:  Recent Labs Lab 07/19/14 0930 07/20/14 0442  WBC 10.5 11.8*  NEUTROABS 4.4  --   HGB 12.9 11.3*  HCT 38.7 33.9*  MCV 91.1 92.6  PLT 278 264   Cardiac Enzymes: No results for input(s): CKTOTAL, CKMB, CKMBINDEX, TROPONINI in the last 168 hours. BNP: Invalid input(s): POCBNP CBG: No results for input(s): GLUCAP in the last 168 hours.  No results found for this or any previous visit (from the past 240 hour(s)).   Scheduled Meds: . acetaminophen  1,000 mg Oral BID  . antiseptic oral rinse  7 mL Mouth Rinse q12n4p  . ceFEPime (MAXIPIME) IV  1 g Intravenous Q8H  .  chlorhexidine  15 mL Mouth Rinse BID  . gabapentin  100 mg Oral TID  . gabapentin  300 mg Oral QHS  . vancomycin  1,000 mg Intravenous Q8H   Continuous Infusions: . 0.9 % NaCl with KCl 20 mEq / L 100 mL/hr (07/20/14 0844)  . heparin 1,200 Units/hr (07/20/14 0844)
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
## 2014-07-20 NOTE — Telephone Encounter (Signed)
new patient appt-called patient rm 1431 @ [facility] and gve appt for 5/05 @ 2:15 w/Heather Jacqulyn DuckingFerrell per MD

## 2014-07-20 NOTE — Progress Notes (Signed)
CRITICAL VALUE ALERT  Critical value received:  Lactic acid Date of notification: 07/20/14  Time of notification:  1715 Critical value read back:Yes.    Nurse who received alert:  g Yarelie Hams rn  MD notified (1st page):  Dr. Elisabeth Pigeonevine Time of first page:  1730 MD notified (2nd page):  Time of second page:  Responding MD: Dr. Elisabeth Pigeonevine Time MD responded:  [phone]
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
## 2014-07-21 DIAGNOSIS — A419 Sepsis, unspecified organism: Principal | ICD-10-CM

## 2014-07-21 DIAGNOSIS — R21 Rash and other nonspecific skin eruption: Secondary | ICD-10-CM | POA: Diagnosis not present

## 2014-07-21 DIAGNOSIS — Z9081 Acquired absence of spleen: Secondary | ICD-10-CM

## 2014-07-21 DIAGNOSIS — E669 Obesity, unspecified: Secondary | ICD-10-CM | POA: Diagnosis present

## 2014-07-21 DIAGNOSIS — K751 Phlebitis of portal vein: Secondary | ICD-10-CM | POA: Diagnosis present

## 2014-07-21 DIAGNOSIS — R748 Abnormal levels of other serum enzymes: Secondary | ICD-10-CM | POA: Diagnosis present

## 2014-07-21 DIAGNOSIS — D649 Anemia, unspecified: Secondary | ICD-10-CM | POA: Diagnosis present

## 2014-07-21 LAB — URINE CULTURE
CULTURE: NO GROWTH
Colony Count: NO GROWTH
Special Requests: NORMAL

## 2014-07-21 LAB — PATHOLOGIST SMEAR REVIEW

## 2014-07-21 LAB — HOMOCYSTEINE: Homocysteine: 6.7 umol/L (ref 0.0–15.0)

## 2014-07-21 LAB — HEPARIN LEVEL (UNFRACTIONATED): HEPARIN UNFRACTIONATED: 0.53 [IU]/mL (ref 0.30–0.70)

## 2014-07-21 MED ORDER — IBUPROFEN 200 MG PO TABS
400.0000 mg | ORAL_TABLET | Freq: Four times a day (QID) | ORAL | Status: DC | PRN
  Administered 2014-07-21 – 2014-08-01 (×13): 400 mg via ORAL
  Filled 2014-07-21 (×15): qty 2

## 2014-07-21 MED ORDER — ALUM & MAG HYDROXIDE-SIMETH 200-200-20 MG/5ML PO SUSP
30.0000 mL | ORAL | Status: DC | PRN
  Administered 2014-07-22: 30 mL via ORAL
  Filled 2014-07-21 (×2): qty 30

## 2014-07-21 MED ORDER — SODIUM CHLORIDE 0.9 % IV SOLN
3.0000 g | Freq: Four times a day (QID) | INTRAVENOUS | Status: DC
  Administered 2014-07-21 – 2014-07-25 (×17): 3 g via INTRAVENOUS
  Filled 2014-07-21 (×18): qty 3

## 2014-07-21 MED ORDER — ACETAMINOPHEN 325 MG PO TABS: 650.0000 mg | ORAL_TABLET | Freq: Once | ORAL | Status: DC

## 2014-07-21 MED ORDER — SODIUM CHLORIDE 0.9 % IV BOLUS (SEPSIS)
500.0000 mL | Freq: Once | INTRAVENOUS | Status: AC
  Administered 2014-07-21: 500 mL via INTRAVENOUS

## 2014-07-21 MED ORDER — METOCLOPRAMIDE HCL 5 MG/ML IJ SOLN
5.0000 mg | Freq: Once | INTRAMUSCULAR | Status: AC
  Administered 2014-07-21: 5 mg via INTRAVENOUS
  Filled 2014-07-21: qty 2
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
## 2014-07-21 NOTE — Progress Notes (Signed)
ANTIBIOTIC CONSULT NOTE - INITIAL  Pharmacy Consult for Unasyn Indication: Septic portal vein thrombophlebitis  Allergies  Allergen Reactions  . Tape Hives    Can use paper tape    Patient Measurements: Height:  (149.9 cm) Weight: 179 lb 14.3 oz (81.6 kg) IBW/kg (Calculated) : 43.2  Vital Signs: Temp: 102.9 F (39.4 C) (04/13 0627) Temp Source: Oral (04/13 0627) BP: 116/62 mmHg (04/13 0627) Pulse Rate: 119 (04/13 0627) Intake/Output from previous day: 04/12 0701 - 04/13 0700 In: 2727.2 [P.O.:300; I.V.:1927.2; IV Piggyback:500] Out: 325 [Urine:325] Intake/Output from this shift: Total I/O In: 500 [IV Piggyback:500] Out: -   Labs:  Recent Labs  07/19/14 0930 07/20/14 0442  WBC 10.5 11.8*  HGB 12.9 11.3*  PLT 278 264  CREATININE 0.66 0.72   Estimated Creatinine Clearance: 104.6 mL/min (by C-G formula based on Cr of 0.72). No results for input(s): VANCOTROUGH, VANCOPEAK, VANCORANDOM, GENTTROUGH, GENTPEAK, GENTRANDOM, TOBRATROUGH, TOBRAPEAK, TOBRARND, AMIKACINPEAK, AMIKACINTROU, AMIKACIN in the last 72 hours.   Microbiology: Recent Results (from the past 720 hour(s))  Culture, Urine     Status: None   Collection Time: 07/19/14  6:20 PM  Result Value Ref Range Status   Specimen Description URINE, CLEAN CATCH  Final   Special Requests Normal  Final   Colony Count NO GROWTH Performed at [facility]   Final   Culture NO GROWTH Performed at [facility]   Final   Report Status 07/21/2014 FINAL  Final  Culture, blood (routine x 2)     Status: None (Preliminary result)   Collection Time: 07/19/14  7:35 PM  Result Value Ref Range Status   Specimen Description BLOOD LEFT ARM  Final   Special Requests BAA 5CC  Final   Culture   Final           BLOOD CULTURE RECEIVED NO GROWTH TO DATE CULTURE WILL BE HELD FOR 5 DAYS BEFORE ISSUING A FINAL NEGATIVE REPORT Performed at [facility]    Report Status PENDING  Incomplete  Culture, blood  (routine x 2)     Status: None (Preliminary result)   Collection Time: 07/19/14  7:35 PM  Result Value Ref Range Status   Specimen Description BLOOD RIGHT HAND  Final   Special Requests BOTTLES DRAWN AEROBIC ONLY 10CC  Final   Culture   Final           BLOOD CULTURE RECEIVED NO GROWTH TO DATE CULTURE WILL BE HELD FOR 5 DAYS BEFORE ISSUING A FINAL NEGATIVE REPORT Performed at [facility]    Report Status PENDING  Incomplete  Culture, blood (x 2)     Status: None (Preliminary result)   Collection Time: 07/20/14  4:05 PM  Result Value Ref Range Status   Specimen Description BLOOD LEFT HAND  Final   Special Requests BOTTLES DRAWN AEROBIC ONLY 6 CC BLUE  Final   Culture   Final           BLOOD CULTURE RECEIVED NO GROWTH TO DATE CULTURE WILL BE HELD FOR 5 DAYS BEFORE ISSUING A FINAL NEGATIVE REPORT Performed at [facility]    Report Status PENDING  Incomplete    Medical History: History reviewed. No pertinent past medical history.  Assessment: 43 yoF with PMH significant for splenectomy at age 10 after trauma, admitted with abdominal pain, vomiting, fevers, and right portal vein thrombosis. She was initially started on Ceftriaxone for positive UA on admission, but changed to vancomycin and cefepime for r/o  sepsis on 4/12.  She continues to have fevers, abdominal pain, leukocytosis.  Pharmacy is now consulted to dose Unasyn per ID consult for suspected septic portal vein thrombophlebitis.  4/11 >> Ceftriaxone x 1 4/12 >> Cefepime >> 4/13 4/12 >> Vancomycin >> 4/13 4/13 >> Unasyn >>  Today, 07/21/2014:  Tmax: 102.9  WBC: 11.8 (4/12)  Renal: SCr 0.72, CrCl >100 ml/min  Blood and urine cultures collected, remain no growth  Goal of Therapy:  Appropriate abx dosing, eradication of infection.   Plan:   Unasyn 3g IV q6h  Follow up renal function, cultures, clinical course.   Lynann Beaverhristine Demeka Sutter PharmD, BCPS Pager [phone] 07/21/2014 10:43 AM

## 2014-07-21 NOTE — Progress Notes (Signed)
Patient Demographics  Carol [name], is a 19 y.o. female, DOB - [birth year], ZOX:[id number]  Admit date - 07/19/2014   Admitting Physician Calvert Cantor, MD  Outpatient Primary MD for the patient is [name], MD  LOS - 2   Chief Complaint  Patient presents with  . 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 Abdominal pain was cramp like, 6/10 in intensity, associated with nausea and vomiting. She has seen PCP 07/14/2014 and was given compazine for nausea and vomiting and ibuprofen for pain but with little symptomatic relief. On admission, patient was hemodynamically stable. She was found to have elevated LFT's which prompted abd [place] study which subsequently revealed portal vein thrombosis. CT abd confirmed this finding as well. She was started on heparin and hematology has seen the patient in consultation. Her UA also showed moderate leukocytes and she was started on empiric rocephin.   Subjective:   Carol [name] today has, No headache, No chest pain,  No Nausea, No new weakness tingling or numbness, No Cough - SOB, still complains of abdominal pain, had fever over 102.9 every a.m..  Assessment & Plan    Principal Problem:   Suppurative pylephlebitis Active Problems:   Abdominal pain   Portal vein thrombosis   Sepsis   Nausea and vomiting   History of splenectomy   Normocytic anemia   Obesity   Elevated liver enzymes   Rash  Sepsis/septic portal vein thrombophlebitis - This is more likely in the setting of septic portal vein thrombophlebitis, patient has fever, tachycardia, tachypnea, leukocytosis. -  Infectious disease consult greatly appreciated, patient meets clinical and radiographic criteria for septic portal vein thrombophlebitis given her fever, abdominal pain, nausea, vomiting, acute portal vein clot and elevated liver enzyme. - Follow on blood cultures, would be low yield given it was done after IV antibiotics were started. - Antibiotics were changed to IV Unasyn to provide additional anaerobic coverage, need PICC line in anticipation of IV antibiotics for the first few weeks. - UTI specimen is contaminated, and cleared of contributing to UTI or not, follow on urine culture.  Elevated liver enzymes - This is in the setting of septic portal vein thrombophlebitis, continue to monitor.  Portal vein thrombosis - Continue with anticoagulation currently on heparin drip, transitioned to oral anticoagulation when more stable.      Code Status: Full code  Family Communication: Father at bedside  Disposition Plan: Remains inpatient   Procedures  None   Consults   Infectious disease next hematology   Medications  Scheduled Meds: . ampicillin-sulbactam (UNASYN) IV  3 g Intravenous Q6H  . antiseptic oral rinse  7 mL Mouth Rinse q12n4p  . chlorhexidine  15 mL Mouth Rinse BID  . gabapentin  100 mg Oral TID  . gabapentin  300 mg Oral QHS   Continuous Infusions: . 0.9 % NaCl with KCl 20 mEq / L 50 mL/hr at 07/20/14 2306  . heparin 1,200 Units/hr (07/21/14 0624)   PRN Meds:.acetaminophen,  diphenhydrAMINE, ibuprofen, morphine injection, ondansetron (ZOFRAN) IV  DVT Prophylaxis  and heparin drip  Lab Results  Component Value Date   PLT 264 07/20/2014    Antibiotics   Anti-infectives    Start     Dose/Rate Route Frequency Ordered Stop   07/21/14 1200  Ampicillin-Sulbactam (UNASYN) 3 g in sodium chloride 0.9 % 100 mL IVPB     3 g 100 mL/hr over 60 Minutes Intravenous Every 6 hours 07/21/14 1045     07/20/14 1600  cefTRIAXone (ROCEPHIN) 1 g in dextrose 5 % 50 mL IVPB -  Premix  Status:  Discontinued     1 g 100 mL/hr over 30 Minutes Intravenous Every 24 hours 07/19/14 1819 07/20/14 1444   07/20/14 1600  vancomycin (VANCOCIN) IVPB 1000 mg/200 mL premix  Status:  Discontinued     1,000 mg 200 mL/hr over 60 Minutes Intravenous Every 8 hours 07/20/14 1513 07/21/14 1044   07/20/14 1600  ceFEPIme (MAXIPIME) 1 g in dextrose 5 % 50 mL IVPB  Status:  Discontinued     1 g 100 mL/hr over 30 Minutes Intravenous Every 8 hours 07/20/14 1513 07/21/14 1044   07/19/14 1515  cefTRIAXone (ROCEPHIN) 1 g in dextrose 5 % 50 mL IVPB     1 g 100 mL/hr over 30 Minutes Intravenous  Once 07/19/14 1507 07/19/14 1545   07/19/14 1509  cefTRIAXone (ROCEPHIN) 1 G injection    Comments:  Lauro Regulus   : cabinet override      07/19/14 1509 07/19/14 1516          Objective:   Filed Vitals:   07/21/14 0627 07/21/14 0854 07/21/14 1235 07/21/14 1530  BP: 116/62   110/64  Pulse: 119   95  Temp: 102.9 F (39.4 C) 100.4 F (38 C) 101.3 F (38.5 C) 98.2 F (36.8 C)  TempSrc: Oral Oral Oral Oral  Resp: 20   24  Height:      Weight:      SpO2: 98%   97%    Wt Readings from Last 3 Encounters:  07/19/14 81.6 kg (179 lb 14.3 oz) (95 %*, Z = 1.60)  06/12/14 77.111 kg (170 lb) (92 %*, Z = 1.40)  04/21/13 55.792 kg (123 lb) (46 %*, Z = -0.09)   * Growth percentiles are based on CDC 2-20 Years data.     Intake/Output Summary (Last 24 hours) at 07/21/14 1633 Last data filed at 07/21/14 0748  Gross per 24 hour  Intake 3227.17 ml  Output      0 ml  Net 3227.17 ml     Physical Exam  Awake Alert, Oriented X 3, No new F.N deficits, Normal affect [place].AT,PERRAL Supple Neck,No JVD, No cervical lymphadenopathy appriciated.  Symmetrical Chest wall movement, Good air movement bilaterally, CTAB RRR,No Gallops,Rubs or new Murmurs, No Parasternal Heave +ve B.Sounds, Abd Soft, mild tenderness to palpation, No organomegaly appriciated, No rebound - guarding or rigidity. No Cyanosis,  Clubbing or edema, No new Rash or bruise    Data Review   Micro Results Recent Results (from the past 240 hour(s))  Culture, Urine     Status: None   Collection Time: 07/19/14  6:20 PM  Result Value Ref Range Status   Specimen Description URINE, CLEAN CATCH  Final   Special Requests Normal  Final   Colony Count NO GROWTH Performed at [facility]   Final   Culture NO GROWTH Performed at [facility]   Final   Report  Status 07/21/2014 FINAL  Final  Culture, blood (routine x 2)     Status: None (Preliminary result)   Collection Time: 07/19/14  7:35 PM  Result Value Ref Range Status   Specimen Description BLOOD LEFT ARM  Final   Special Requests BAA 5CC  Final   Culture   Final           BLOOD CULTURE RECEIVED NO GROWTH TO DATE CULTURE WILL BE HELD FOR 5 DAYS BEFORE ISSUING A FINAL NEGATIVE REPORT [facility]formed [facility]    Report Status PENDING  Incomplete  Culture, blood (routine x 2)     Status: None (Preliminary result)   Collection Time: 07/19/14  7:35 PM  Result Value Ref Range Status   Specimen Description BLOOD RIGHT HAND  Final   Special Requests BOTTLES DRAWN AEROBIC ONLY 10CC  Final   Culture   Final           BLOOD CULTURE RECEIVED NO GROWTH TO DATE CULTURE WILL BE HELD FOR 5 DAYS BEFORE ISSUING A FINAL NEGATIVE REPORT [facility]formed at [facility]    Report Status PENDING  Incomplete  Culture, blood (x 2)     Status: None (Preliminary result)   Collection Time: 07/20/14  4:05 PM  Result Value Ref Range Status   Specimen Description BLOOD LEFT HAND  Final   Special Requests BOTTLES DRAWN AEROBIC ONLY 6 CC BLUE  Final   Culture   Final           BLOOD CULTURE RECEIVED NO GROWTH TO DATE CULTURE WILL BE HELD FOR 5 DAYS BEFORE ISSUING A FINAL NEGATIVE REPORT Performed at [facility]    Report Status PENDING  Incomplete    Radiology Reports Dg Chest Port 1 View  07/20/2014   CLINICAL DATA:  Shortness of breath, fever,  sepsis  EXAM: PORTABLE CHEST - 1 VIEW  COMPARISON:  04/21/2013  FINDINGS: Heart and mediastinal contours are within normal limits. No focal opacities or effusions. No acute bony abnormality. Posterior spinal rods in the thoracic spine with scoliosis.  IMPRESSION: No active disease.   Electronically Signed   By: Charlett NoseKevin  Dover M.D.   On: 07/20/2014 15:27    CBC  Recent Labs Lab 07/19/14 0930 07/20/14 0442  WBC 10.5 11.8*  HGB 12.9 11.3*  HCT 38.7 33.9*  PLT 278 264  MCV 91.1 92.6  MCH 30.4 30.9  MCHC 33.3 33.3  RDW 15.4 15.4  LYMPHSABS 4.6*  --   MONOABS 0.5  --   EOSABS 0.5  --   BASOSABS 0.5*  --     Chemistries   Recent Labs Lab 07/19/14 0930 07/20/14 0442  NA 140 137  K 3.6 3.6  CL 108 107  CO2 23 22  GLUCOSE 112* 112*  BUN 6 5*  CREATININE 0.66 0.72  CALCIUM 9.1 7.9*  AST 92* 80*  ALT 79* 73*  ALKPHOS 154* 133*  BILITOT 0.1* 0.3   ------------------------------------------------------------------------------------------------------------------ estimated creatinine clearance is 104.6 mL/min (by C-G formula based on Cr of 0.72). ------------------------------------------------------------------------------------------------------------------ No results for input(s): HGBA1C in the last 72 hours. ------------------------------------------------------------------------------------------------------------------ No results for input(s): CHOL, HDL, LDLCALC, TRIG, CHOLHDL, LDLDIRECT in the last 72 hours. ------------------------------------------------------------------------------------------------------------------ No results for input(s): TSH, T4TOTAL, T3FREE, THYROIDAB in the last 72 hours.  Invalid input(s): FREET3 ------------------------------------------------------------------------------------------------------------------ No results for input(s): VITAMINB12, FOLATE, FERRITIN, TIBC, IRON, RETICCTPCT in the last 72 hours.  Coagulation profile No results for  input(s): INR, PROTIME in the last 168 hours.  No results for input(s): DDIMER in the last 72 hours.  Cardiac Enzymes No results for input(s): CKMB, TROPONINI, MYOGLOBIN in the last 168 hours.  Invalid input(s): CK ------------------------------------------------------------------------------------------------------------------ Invalid input(s): POCBNP     Time Spent in minutes   35 minutes   Elona Yinger M.D on 07/21/2014 at 4:33 PM  Between 7am to 7pm - Pager - [phone]  After 7pm go to [email or web address] - password [email or web address]  And look for the night coverage person covering for me after hours  [facility] Office  [phone]   **Disclaimer: This note may have been dictated with voice recognition software. Similar sounding words can inadvertently be transcribed and this note may contain transcription errors which may not have been corrected upon publication of note.**

## 2014-07-21 NOTE — Progress Notes (Signed)
Pt had temp of 102.7. Attempted to administer ibuprofen and pt threw up med. Administered IV antiemetic. Informed pt that med for fever may have to be ordered in suppository form and pt stated that she does not want suppository, wants to try to take PO med after nausea subsides. Will re-assess nausea in 30 minutes and re-attempt to administer antipyretic.

## 2014-07-21 NOTE — Progress Notes (Signed)
ANTICOAGULATION CONSULT NOTE  Pharmacy Consult for heparin Indication: portal vein thrombosis  Allergies  Allergen Reactions  . Tape Hives    Can use paper tape    Patient Measurements: Height: 4\' 11"  (149.9 cm) Weight: 179 lb 14.3 oz (81.6 kg) IBW/kg (Calculated) : 43.2   Vital Signs: Temp: 102.9 F (39.4 C) (04/13 0627) Temp Source: Oral (04/13 0627) BP: 116/62 mmHg (04/13 0627) Pulse Rate: 119 (04/13 0627)  Labs:  Recent Labs  07/19/14 0930 07/19/14 2145 07/20/14 0442 07/20/14 1605 07/21/14 0507  HGB 12.9  --  11.3*  --   --   HCT 38.7  --  33.9*  --   --   PLT 278  --  264  --   --   APTT  --   --   --  69*  --   HEPARINUNFRC  --  0.57 0.48  --  0.53  CREATININE 0.66  --  0.72  --   --     Estimated Creatinine Clearance: 104.6 mL/min (by C-G formula based on Cr of 0.72).  Medication: . 0.9 % NaCl with KCl 20 mEq / L 50 mL/hr at 07/20/14 2306  . heparin 1,200 Units/hr (07/21/14 16100624)     Assessment: 19 yoF [facility] with fever, N/V, abdominal pain.  PMH includes traumatic splenectomy at age 19.  CT confirmed portal vein thrombosis.  Hematology has been consulted for hypercoagulable workup and [facility]  Of note, she takes oral contraceptives (started 12/2013) with most recent dose on 4/11.  Pharmacy is consulted to dose Heparin IV.  Today, 07/21/2014:  Heparin level 0.53, therapeutic  CBC: Hgb 11.3, Plt 264 (4/12)  Renal: WNL   Goal of Therapy:  Heparin level 0.3-0.7 units/ml Monitor platelets by anticoagulation protocol: Yes   Plan:   Continue heparin IV infusion at 1200 units/hr  Daily heparin level and CBC  Continue to monitor H&H and platelets  Follow up long-term anticoagulation plan.   Lynann Beaverhristine Rylinn Linzy PharmD, BCPS Pager [phone] 07/21/2014 7:07 AM

## 2014-07-22 DIAGNOSIS — R748 Abnormal levels of other serum enzymes: Secondary | ICD-10-CM

## 2014-07-22 DIAGNOSIS — I81 Portal vein thrombosis: Secondary | ICD-10-CM

## 2014-07-22 DIAGNOSIS — K751 Phlebitis of portal vein: Secondary | ICD-10-CM

## 2014-07-22 DIAGNOSIS — E669 Obesity, unspecified: Secondary | ICD-10-CM

## 2014-07-22 DIAGNOSIS — I8290 Acute embolism and thrombosis of unspecified vein: Secondary | ICD-10-CM

## 2014-07-22 DIAGNOSIS — D509 Iron deficiency anemia, unspecified: Secondary | ICD-10-CM

## 2014-07-22 DIAGNOSIS — R109 Unspecified abdominal pain: Secondary | ICD-10-CM

## 2014-07-22 LAB — CBC
HCT: 34.5 % — ABNORMAL LOW (ref 36.0–46.0)
HEMOGLOBIN: 11.1 g/dL — AB (ref 12.0–15.0)
MCH: 29.9 pg (ref 26.0–34.0)
MCHC: 32.2 g/dL (ref 30.0–36.0)
MCV: 93 fL (ref 78.0–100.0)
Platelets: 250 10*3/uL (ref 150–400)
RBC: 3.71 MIL/uL — ABNORMAL LOW (ref 3.87–5.11)
RDW: 15.9 % — ABNORMAL HIGH (ref 11.5–15.5)
WBC: 11.9 10*3/uL — ABNORMAL HIGH (ref 4.0–10.5)

## 2014-07-22 LAB — BETA-2-GLYCOPROTEIN I ABS, IGG/M/A
Beta-2 Glyco I IgG: <9 GPI IgG units (ref 0–20)
Beta-2-Glycoprotein I IgA: <9 GPI IgA units (ref 0–25)
Beta-2-Glycoprotein I IgM: <9 GPI IgM units (ref 0–32)

## 2014-07-22 LAB — BASIC METABOLIC PANEL
Anion gap: 8 (ref 5–15)
BUN: <5 mg/dL — ABNORMAL LOW (ref 6–23)
CHLORIDE: 107 mmol/L (ref 96–112)
CO2: 24 mmol/L (ref 19–32)
Calcium: 8.4 mg/dL (ref 8.4–10.5)
Creatinine, Ser: 0.64 mg/dL (ref 0.50–1.10)
Glucose, Bld: 100 mg/dL — ABNORMAL HIGH (ref 70–99)
POTASSIUM: 4.2 mmol/L (ref 3.5–5.1)
SODIUM: 139 mmol/L (ref 135–145)

## 2014-07-22 LAB — LUPUS ANTICOAGULANT PANEL
DRVVT: 52.7 s (ref 0.0–55.1)
PTT LA: 38.9 s (ref 0.0–50.0)

## 2014-07-22 LAB — HEPATIC FUNCTION PANEL
ALT: 111 U/L — ABNORMAL HIGH (ref 0–35)
AST: 157 U/L — ABNORMAL HIGH (ref 0–37)
Albumin: 2.9 g/dL — ABNORMAL LOW (ref 3.5–5.2)
Alkaline Phosphatase: 133 U/L — ABNORMAL HIGH (ref 39–117)
BILIRUBIN DIRECT: 0.3 mg/dL (ref 0.0–0.5)
BILIRUBIN INDIRECT: 0.4 mg/dL (ref 0.3–0.9)
Total Bilirubin: 0.7 mg/dL (ref 0.3–1.2)
Total Protein: 6.4 g/dL (ref 6.0–8.3)

## 2014-07-22 LAB — HEPARIN LEVEL (UNFRACTIONATED): HEPARIN UNFRACTIONATED: 0.53 [IU]/mL (ref 0.30–0.70)

## 2014-07-22 LAB — JAK2 GENOTYPR

## 2014-07-22 LAB — CARDIOLIPIN ANTIBODIES, IGG, IGM, IGA
ANTICARDIOLIPIN IGA: 12 U/mL — AB (ref 0–11)
Anticardiolipin IgG: 10 GPL U/mL (ref 0–14)
Anticardiolipin IgM: <9 MPL U/mL (ref 0–12)

## 2014-07-22 LAB — PROTEIN C ACTIVITY: PROTEIN C ACTIVITY: 104 % (ref 74–151)

## 2014-07-22 LAB — PROTEIN S ACTIVITY: Protein S Activity: 86 % (ref 60–145)

## 2014-07-22 LAB — PROTEIN S, TOTAL: PROTEIN S AG TOTAL: 139 % (ref 58–150)

## 2014-07-22 MED ORDER — RIVAROXABAN 20 MG PO TABS: 20.0000 mg | ORAL_TABLET | Freq: Every day | ORAL | Status: DC

## 2014-07-22 MED ORDER — LORAZEPAM 2 MG/ML IJ SOLN
0.5000 mg | Freq: Once | INTRAMUSCULAR | Status: AC
  Administered 2014-07-22: 0.5 mg via INTRAVENOUS
  Filled 2014-07-22: qty 1

## 2014-07-22 MED ORDER — SODIUM CHLORIDE 0.9 % IJ SOLN
10.0000 mL | INTRAMUSCULAR | Status: DC | PRN
  Administered 2014-07-27 – 2014-08-02 (×5): 10 mL
  Filled 2014-07-22 (×5): qty 40

## 2014-07-22 MED ORDER — SODIUM CHLORIDE 0.9 % IV BOLUS (SEPSIS)
500.0000 mL | Freq: Once | INTRAVENOUS | Status: AC
  Administered 2014-07-22: 500 mL via INTRAVENOUS

## 2014-07-22 MED ORDER — RIVAROXABAN 15 MG PO TABS
15.0000 mg | ORAL_TABLET | Freq: Two times a day (BID) | ORAL | Status: DC
  Administered 2014-07-22 – 2014-08-02 (×22): 15 mg via ORAL
  Filled 2014-07-22 (×22): qty 1

## 2014-07-22 NOTE — Progress Notes (Signed)
Patient Demographics  Carol [name], is a 19 y.o. female, DOB - [birth year], [id number]  Admit date - 07/19/2014   Admitting Physician Calvert Cantor, MD  Outpatient Primary MD for the patient is [name], MD  LOS - 3   Chief Complaint  Patient presents with  . 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 Abdominal pain was cramp like, 6/10 in intensity, associated with nausea and vomiting. She has seen PCP 07/14/2014 and was given compazine for nausea and vomiting and ibuprofen for pain but with little symptomatic relief. On admission, patient was hemodynamically stable. She was found to have elevated LFT's which prompted abd [place] study which subsequently revealed portal vein thrombosis. CT abd confirmed this finding as well. She was started on heparin and hematology has seen the patient in consultation. Patient continues to have significant high fever, infectious disease were consulted, patient was thought to have septic portal vein thrombophlebitis, initially she was on IV vancomycin and cefepime given her history of splenectomy, antibiotic were transitioned to IV Unasyn to cover for anaerobes per ID recommendation.  Subjective:   Carol [name] today has, No headache, No chest pain,  No Nausea, No new weakness tingling or numbness, No Cough - SOB, abdominal pain improved , reports left inner thigh pain , as well had fever of 102.7 this morning . Assessment & Plan    Principal Problem:   Suppurative pylephlebitis Active Problems:   Abdominal pain   Portal vein thrombosis   Sepsis   Nausea and vomiting   History of  splenectomy   Normocytic anemia   Obesity   Elevated liver enzymes   Rash  Sepsis/septic portal vein thrombophlebitis - Infectious disease consult greatly appreciated, patient meets clinical and radiographic criteria for septic portal vein thrombophlebitis given her fever, abdominal pain, nausea, vomiting, acute portal vein clot and elevated liver enzyme. - Follow on blood cultures, would be low yield given it was done after IV antibiotics were started, no growth to date. - Antibiotics were changed to IV Unasyn 4/13  to provide additional anaerobic coverage, need PICC line in anticipation of IV antibiotics for the first few weeks. - UTI specimen is contaminated, follow on urine culture. - Follow-up appointments with hematology over to scheduled as an outpatient.  Elevated liver enzymes - This is in the setting of septic portal vein thrombophlebitis, continue to monitor, slightly increased today.  Portal vein thrombosis -Patient started initially on heparin drip, transitioned to Xarelto.  Complains of left inner thigh pain with mild erythema. - Will check venous Doppler.     Code Status: Full code  Family Communication:  mother at bedside  Disposition Plan: Remains inpatient   Procedures  None   Consults   Infectious disease  Hematology   Medications  Scheduled Meds: . ampicillin-sulbactam (UNASYN) IV  3 g Intravenous Q6H  . antiseptic oral rinse  7 mL Mouth Rinse q12n4p  . chlorhexidine  15 mL Mouth Rinse BID  . gabapentin  100  mg Oral TID  . gabapentin  300 mg Oral QHS   Continuous Infusions: . 0.9 % NaCl with KCl 20 mEq / L 50 mL/hr at 07/21/14 2201  . heparin 1,200 Units/hr (07/22/14 0216)   PRN Meds:.acetaminophen, alum & mag hydroxide-simeth, diphenhydrAMINE, ibuprofen, morphine injection, ondansetron (ZOFRAN) IV  DVT Prophylaxis  and heparin drip  Lab Results  Component Value Date   PLT 250 07/22/2014    Antibiotics   Anti-infectives    Start      Dose/Rate Route Frequency Ordered Stop   07/21/14 1200  Ampicillin-Sulbactam (UNASYN) 3 g in sodium chloride 0.9 % 100 mL IVPB     3 g 100 mL/hr over 60 Minutes Intravenous Every 6 hours 07/21/14 1045     07/20/14 1600  cefTRIAXone (ROCEPHIN) 1 g in dextrose 5 % 50 mL IVPB - Premix  Status:  Discontinued     1 g 100 mL/hr over 30 Minutes Intravenous Every 24 hours 07/19/14 1819 07/20/14 1444   07/20/14 1600  vancomycin (VANCOCIN) IVPB 1000 mg/200 mL premix  Status:  Discontinued     1,000 mg 200 mL/hr over 60 Minutes Intravenous Every 8 hours 07/20/14 1513 07/21/14 1044   07/20/14 1600  ceFEPIme (MAXIPIME) 1 g in dextrose 5 % 50 mL IVPB  Status:  Discontinued     1 g 100 mL/hr over 30 Minutes Intravenous Every 8 hours 07/20/14 1513 07/21/14 1044   07/19/14 1515  cefTRIAXone (ROCEPHIN) 1 g in dextrose 5 % 50 mL IVPB     1 g 100 mL/hr over 30 Minutes Intravenous  Once 07/19/14 1507 07/19/14 1545   07/19/14 1509  cefTRIAXone (ROCEPHIN) 1 G injection    Comments:  Lauro Regulus   : cabinet override      07/19/14 1509 07/19/14 1516          Objective:   Filed Vitals:   07/21/14 2209 07/22/14 0320 07/22/14 0506 07/22/14 0654  BP: 124/69  124/67   Pulse: 107  106   Temp: 98.9 F (37.2 C) 99.1 F (37.3 C) 102.6 F (39.2 C) 102.7 F (39.3 C)  TempSrc: Oral Oral Oral Oral  Resp: 20  20   Height:      Weight:      SpO2: 98%  98%     Wt Readings from Last 3 Encounters:  07/19/14 81.6 kg (179 lb 14.3 oz) (95 %*, Z = 1.60)  06/12/14 77.111 kg (170 lb) (92 %*, Z = 1.40)  04/21/13 55.792 kg (123 lb) (46 %*, Z = -0.09)   * Growth percentiles are based on CDC 2-20 Years data.     Intake/Output Summary (Last 24 hours) at 07/22/14 1121 Last data filed at 07/22/14 0600  Gross per 24 hour  Intake   2368 ml  Output      1 ml  Net   2367 ml     Physical Exam  Awake Alert, Oriented X 3, No new F.N deficits, Normal affect [place].AT,PERRAL Supple Neck,No JVD, No cervical  lymphadenopathy appriciated.  Symmetrical Chest wall movement, Good air movement bilaterally, CTAB RRR,No Gallops,Rubs or new Murmurs, No Parasternal Heave +ve B.Sounds, Abd Soft, mild tenderness to palpation, No organomegaly appriciated, No rebound - guarding or rigidity. No Cyanosis, Clubbing or edema, No new Rash or bruise    Data Review   Micro Results Recent Results (from the past 240 hour(s))  Culture, Urine     Status: None   Collection Time: 07/19/14  6:20 PM  Result Value  Ref Range Status   Specimen Description URINE, CLEAN CATCH  Final   Special Requests Normal  Final   Colony Count NO GROWTH Performed at [facility]   Final   Culture NO GROWTH [facility]formed at [facility]   Final [facility]  Report Status 07/21/2014 FINAL  Final  Culture, blood (routine x 2)     Status: None (Preliminary result)   Collection Time: 07/19/14  7:35 PM  Result Value Ref Range Status   Specimen Description BLOOD LEFT ARM  Final   Special Requests BAA 5CC  Final   Culture   Final           BLOOD CULTURE RECEIVED NO GROWTH TO DATE CULTURE WILL BE HELD FOR 5 DAYS BEFORE ISSUING A FINAL NEGATIVE REPORT Performed at [facility]    Report Status PENDING  Incomplete  Culture, blood (routine x 2)     Status: None (Preliminary result)   Collection Time: 07/19/14  7:35 PM  Result Value Ref Range Status   Specimen Description BLOOD RIGHT HAND  Final   Special Requests BOTTLES DRAWN AEROBIC ONLY 10CC  Final   Culture   Final           BLOOD CULTURE RECEIVED NO GROWTH TO DATE CULTURE WILL BE HELD FOR 5 DAYS BEFORE ISSUING A FINAL NEGATIVE REPORT Performed at [facility]    Report Status PENDING  Incomplete  Culture, blood (x 2)     Status: None (Preliminary result)   Collection Time: 07/20/14  4:05 PM  Result Value Ref Range Status   Specimen Description BLOOD LEFT HAND  Final   Special Requests BOTTLES DRAWN AEROBIC ONLY 6 CC BLUE  Final   Culture   Final            BLOOD CULTURE RECEIVED NO GROWTH TO DATE CULTURE WILL BE HELD FOR 5 DAYS BEFORE ISSUING A FINAL NEGATIVE REPORT Performed at [facility]    Report Status PENDING  Incomplete    Radiology Reports Dg Chest Port 1 View  07/20/2014   CLINICAL DATA:  Shortness of breath, fever, sepsis  EXAM: PORTABLE CHEST - 1 VIEW  COMPARISON:  04/21/2013  FINDINGS: Heart and mediastinal contours are within normal limits. No focal opacities or effusions. No acute bony abnormality. Posterior spinal rods in the thoracic spine with scoliosis.  IMPRESSION: No active disease.   Electronically Signed   By: Charlett NoseKevin  Dover M.D.   On: 07/20/2014 15:27    CBC  Recent Labs Lab 07/19/14 0930 07/20/14 0442 07/22/14 0501  WBC 10.5 11.8* 11.9*  HGB 12.9 11.3* 11.1*  HCT 38.7 33.9* 34.5*  PLT 278 264 250  MCV 91.1 92.6 93.0  MCH 30.4 30.9 29.9  MCHC 33.3 33.3 32.2  RDW 15.4 15.4 15.9*  LYMPHSABS 4.6*  --   --   MONOABS 0.5  --   --   EOSABS 0.5  --   --   BASOSABS 0.5*  --   --     Chemistries   Recent Labs Lab 07/19/14 0930 07/20/14 0442 07/22/14 0501  NA 140 137 139  K 3.6 3.6 4.2  CL 108 107 107  CO2 23 22 24   GLUCOSE 112* 112* 100*  BUN 6 5* <5*  CREATININE 0.66 0.72 0.64  CALCIUM 9.1 7.9* 8.4  AST 92* 80* 157*  ALT 79* 73* 111*  ALKPHOS 154* 133* 133*  BILITOT 0.1* 0.3 0.7   ------------------------------------------------------------------------------------------------------------------ estimated creatinine clearance is 104.6 mL/min (  by C-G formula based on Cr of 0.64). ------------------------------------------------------------------------------------------------------------------ No results for input(s): HGBA1C in the last 72 hours. ------------------------------------------------------------------------------------------------------------------ No results for input(s): CHOL, HDL, LDLCALC, TRIG, CHOLHDL, LDLDIRECT in the last 72  hours. ------------------------------------------------------------------------------------------------------------------ No results for input(s): TSH, T4TOTAL, T3FREE, THYROIDAB in the last 72 hours.  Invalid input(s): FREET3 ------------------------------------------------------------------------------------------------------------------ No results for input(s): VITAMINB12, FOLATE, FERRITIN, TIBC, IRON, RETICCTPCT in the last 72 hours.  Coagulation profile No results for input(s): INR, PROTIME in the last 168 hours.  No results for input(s): DDIMER in the last 72 hours.  Cardiac Enzymes No results for input(s): CKMB, TROPONINI, MYOGLOBIN in the last 168 hours.  Invalid input(s): CK ------------------------------------------------------------------------------------------------------------------ Invalid input(s): POCBNP     Time Spent in minutes   35 minutes   Allyna Pittsley M.D on 07/22/2014 at 11:21 AM  Between 7am to 7pm - Pager - [phone]  After 7pm go to [email or web address] - password [email or web address]  And look for the night coverage person covering for me after hours  [facility] Office  [phone]   **Disclaimer: This note may have been dictated with voice recognition software. Similar sounding words can inadvertently be transcribed and this note may contain transcription errors which may not have been corrected upon publication of note.**

## 2014-07-22 NOTE — Progress Notes (Signed)
Patient ID: Carol [name], female   DOB: Aug 20, 1995, 19 y.o.   MRN: [id number]         [facility]    Date of Admission:  07/19/2014   Total days of antibiotics 4        Day 2 ampicillin sulbactam         Principal Problem:   Suppurative pylephlebitis Active Problems:   Abdominal pain   Portal vein thrombosis   Sepsis   Nausea and vomiting   Elevated liver enzymes   Rash   History of splenectomy   Normocytic anemia   Obesity   . ampicillin-sulbactam (UNASYN) IV  3 g Intravenous Q6H  . antiseptic oral rinse  7 mL Mouth Rinse q12n4p  . chlorhexidine  15 mL Mouth Rinse BID  . gabapentin  100 mg Oral TID  . gabapentin  300 mg Oral QHS    Subjective: She states that she feels a tiny bit better. She still having abdominal pain and nausea but no vomiting today. She has taken some diphenhydramine for her rash. She feels the rash and itching have improved.  Review of Systems: Pertinent items are noted in HPI.  History reviewed. No pertinent past medical history.  History  Substance Use Topics  . Smoking status: Never Smoker   . Smokeless tobacco: Not on file  . Alcohol Use: No    No family history on file. Allergies  Allergen Reactions  . Tape Hives    Can use paper tape    OBJECTIVE: Blood pressure 100/58, pulse 86, temperature 98.3 F (36.8 C), temperature source Oral, resp. rate 18, height 4\' 11"  (1.499 m), weight 179 lb 14.3 oz (81.6 kg), last menstrual period 12/18/2013, SpO2 100 %. General: She is in better spirits today and joking with me. Skin: No rash. Lungs: Clear Cor: Regular S1 and S2 with no murmur Abdomen: Soft with mild diffuse tenderness   Lab Results Lab Results  Component Value Date   WBC 11.9* 07/22/2014   HGB 11.1* 07/22/2014   HCT 34.5* 07/22/2014   MCV 93.0 07/22/2014   PLT 250 07/22/2014    Lab Results  Component Value Date   CREATININE 0.64 07/22/2014   BUN <5* 07/22/2014   NA 139 07/22/2014   K  4.2 07/22/2014   CL 107 07/22/2014   CO2 24 07/22/2014    Lab Results  Component Value Date   ALT 111* 07/22/2014   AST 157* 07/22/2014   ALKPHOS 133* 07/22/2014   BILITOT 0.7 07/22/2014     Microbiology: Recent Results (from the past 240 hour(s))  Culture, Urine     Status: None   Collection Time: 07/19/14  6:20 PM  Result Value Ref Range Status   Specimen Description URINE, CLEAN CATCH  Final   Special Requests Normal  Final   Colony Count NO GROWTH [facility]   Final   Culture NO GROWTH [facility]formed at [facility]   Final   Report Status 07/21/2014 FINAL  Final  Culture, blood (routine x 2)     Status: None (Preliminary result)   Collection Time: 07/19/14  7:35 PM  Result Value Ref Range Status   Specimen Description BLOOD LEFT ARM  Final   Special Requests BAA 5CC  Final   Culture   Final           BLOOD CULTURE RECEIVED NO GROWTH TO DATE CULTURE WILL BE HELD FOR 5 DAYS BEFORE ISSUING A FINAL NEGATIVE REPORT  Performed at [facility]    Report Status PENDING  Incomplete  Culture, blood (routine x 2)     Status: None (Preliminary result)   Collection Time: 07/19/14  7:35 PM  Result Value Ref Range Status   Specimen Description BLOOD RIGHT HAND  Final   Special Requests BOTTLES DRAWN AEROBIC ONLY 10CC  Final   Culture   Final           BLOOD CULTURE RECEIVED NO GROWTH TO DATE CULTURE WILL BE HELD FOR 5 DAYS BEFORE ISSUING A FINAL NEGATIVE REPORT Performed at [facility]    Report Status PENDING  Incomplete  Culture, blood (x 2)     Status: None (Preliminary result)   Collection Time: 07/20/14  4:05 PM  Result Value Ref Range Status   Specimen Description BLOOD LEFT HAND  Final   Special Requests BOTTLES DRAWN AEROBIC ONLY 6 CC BLUE  Final   Culture   Final           BLOOD CULTURE RECEIVED NO GROWTH TO DATE CULTURE WILL BE HELD FOR 5 DAYS BEFORE ISSUING A FINAL NEGATIVE REPORT Performed at [facility]     Report Status PENDING  Incomplete    Assessment: She may be showing a little bit of clinical improvement but remains febrile. Her liver enzymes remain elevated. Her blood cultures may be falsely negative due to prior antibiotic therapy before they were drawn. Her rash appears to be resolving. It was probably due to vancomycin.  Plan: 1. Continue ampicillin sulbactam and rivaroxaban  Cliffton Asters, MD [facility] [phone] pager   [phone] cell 07/22/2014, 2:50 PM

## 2014-07-22 NOTE — Progress Notes (Signed)
*  Preliminary Results* Left lower extremity venous duplex completed. Left lower extremity is negative for deep vein thrombosis. There is no evidence of left Baker's cyst.  07/22/2014 2:42 PM  Gertie FeyMichelle Phong Isenberg, RVT, RDCS, RDMS

## 2014-07-22 NOTE — Progress Notes (Signed)
COURTESY NOTE:  Overall plan noted--patient will be in the hospital until asymptomatic, then continue IV ABX at home.  From a clot point of view she is being transitioned to rivaroxaban today.  She is scheduled to see us 5/5 but if she is not discharged well before then we will postpone the visit. The plan is for at least 3 months of anticoagulation but I am still waiting on the results of the hypercoagulable panel.  I will follow peripherally. Please let me know if I can be of further help before discharge.

## 2014-07-22 NOTE — Progress Notes (Signed)
Assess pt's PIV x 2.   Infusing without any difficulties and appear WNL.  No point tenderness.  Pt states both are not hurting at this time.  Will await PICC insertion.

## 2014-07-22 NOTE — Progress Notes (Signed)
ANTICOAGULATION CONSULT NOTE  Pharmacy Consult for heparin Indication: portal vein thrombosis  Allergies  Allergen Reactions  . Tape Hives    Can use paper tape    Patient Measurements: Height: 4\' 11"  (149.9 cm) Weight: 179 lb 14.3 oz (81.6 kg) IBW/kg (Calculated) : 43.2   Vital Signs: Temp: 102.6 F (39.2 C) (04/14 0506) Temp Source: Oral (04/14 0506) BP: 124/67 mmHg (04/14 0506) Pulse Rate: 106 (04/14 0506)  Labs:  Recent Labs  07/19/14 0930  07/20/14 0442 07/20/14 1605 07/21/14 0507 07/22/14 0501  HGB 12.9  --  11.3*  --   --  11.1*  HCT 38.7  --  33.9*  --   --  34.5*  PLT 278  --  264  --   --  250  APTT  --   --   --  69*  --   --   HEPARINUNFRC  --   < > 0.48  --  0.53 0.53  CREATININE 0.66  --  0.72  --   --  0.64  < > = values in this interval not displayed.  Estimated Creatinine Clearance: 104.6 mL/min (by C-G formula based on Cr of 0.64).  Medication: . 0.9 % NaCl with KCl 20 mEq / L 50 mL/hr at 07/21/14 2201  . heparin 1,200 Units/hr (07/22/14 0216)     Assessment: 18 [facility] with fever, N/V, abdominal pain.  PMH includes traumatic splenectomy at age 89.  CT confirmed portal vein thrombosis.  Hematology has been consulted for hypercoagulable workup and [facility].  Of note, she takes oral contraceptives (started 12/2013) with most recent dose on 4/11.  Pharmacy is consulted to dose Heparin IV.  Today, 07/22/2014:  Heparin level 0.53, therapeutic  CBC: Hgb 11.1, Plt 250  Renal: WNL  AST/ALT increasing, 80/73 > 157/111    Goal of Therapy:  Heparin level 0.3-0.7 units/ml Monitor platelets by anticoagulation protocol: Yes   Plan:   Continue heparin IV infusion at 1200 units/hr  Daily heparin level and CBC  Continue to monitor H&H and platelets  Follow up long-term anticoagulation plan.   Lynann Beaverhristine Amela Handley PharmD, BCPS Pager [phone] 07/22/2014 6:28 AM

## 2014-07-22 NOTE — Progress Notes (Signed)
Peripherally Inserted Central Catheter/Midline Placement  The IV Nurse has discussed with the patient and/or persons authorized to consent for the patient, the purpose of this procedure and the potential benefits and risks involved with this procedure.  The benefits include less needle sticks, lab draws from the catheter and patient may be discharged home with the catheter.  Risks include, but not limited to, infection, bleeding, blood clot (thrombus formation), and puncture of an artery; nerve damage and irregular heat beat.  Alternatives to this procedure were also discussed.  PICC/Midline Placement Documentation  PICC / Midline Single Lumen 07/22/14 PICC Left Basilic 42 cm 2 cm (Active)  Indication for Insertion or Continuance of Line Home intravenous therapies (PICC only) 07/22/2014  8:50 PM  Exposed Catheter (cm) 2 cm 07/22/2014  8:50 PM  Site Assessment Clean;Dry;Intact 07/22/2014  8:50 PM  Line Status Flushed;Saline locked 07/22/2014  8:50 PM  Dressing Type Transparent 07/22/2014  8:50 PM  Dressing Status Clean;Dry;Intact;Antimicrobial disc in place 07/22/2014  8:50 PM  Dressing Intervention New dressing 07/22/2014  8:50 PM  Dressing Change Due 07/29/14 07/22/2014  8:50 PM       Netta Corriganhomas, Prentis Langdon L 07/22/2014, 9:05 PM

## 2014-07-22 NOTE — Progress Notes (Signed)
ANTICOAGULATION CONSULT NOTE  Pharmacy Consult for Xarelto Indication: portal vein thrombosis  Allergies  Allergen Reactions  . Tape Hives    Can use paper tape    Patient Measurements: Height: 4\' 11"  (149.9 cm) Weight: 179 lb 14.3 oz (81.6 kg) IBW/kg (Calculated) : 43.2   Vital Signs: Temp: 102.7 F (39.3 C) (04/14 0654) Temp Source: Oral (04/14 0654) BP: 124/67 mmHg (04/14 0506) Pulse Rate: 106 (04/14 0506)  Labs:  Recent Labs  07/20/14 0442 07/20/14 1605 07/21/14 0507 07/22/14 0501  HGB 11.3*  --   --  11.1*  HCT 33.9*  --   --  34.5*  PLT 264  --   --  250  APTT  --  69*  --   --   HEPARINUNFRC 0.48  --  0.53 0.53  CREATININE 0.72  --   --  0.64    Estimated Creatinine Clearance: 104.6 mL/min (by C-G formula based on Cr of 0.64).  Medication: . 0.9 % NaCl with KCl 20 mEq / L 50 mL/hr at 07/21/14 2201  . heparin 1,200 Units/hr (07/22/14 0216)     Assessment: 19 [name] with fever, N/V, abdominal pain.  PMH includes traumatic splenectomy at age 89.  CT confirmed portal vein thrombosis.  Hematology has been consulted for hypercoagulable workup and [facility].  Of note, she takes oral contraceptives (started 12/2013) with most recent dose on 4/11.  Pharmacy was initially consulted to dose Heparin IV, and now transition to Xarelto.  Today, 07/22/2014:  Heparin level 0.53, therapeutic with AM labs  CBC: Hgb 11.1, Plt 250  Renal: CrCl > 100 ml/min  AST/ALT increasing, 80/73 > 157/111    Goal of Therapy:  Heparin level 0.3-0.7 units/ml Monitor platelets by anticoagulation protocol: Yes   Plan:   Continue heparin IV infusion at 1200 units/hr until 1700  Xarelto 15mg  PO BID twice daily with meals x21 days  Start after PICC inserted  First dose today at 1700 when heparin infusion d/c  Follow initial 21 days with Xarelto 20mg  once daily with supper  Follow up CBC, renal function, hypercoagulable panel.  Pharmacy to provide  Xarelto education prior to discharge.  If PICC line cannot be inserted, continue heparin infusion.  Lynann Beaverhristine Kerilyn Cortner PharmD, BCPS Pager [phone] 07/22/2014 12:40 PM

## 2014-07-22 NOTE — Progress Notes (Signed)
Pharmacy: Re- heparin to xarelto  Patient's a 19 y.o on heparin for protal vein thrombosis.  S/p PICC placement tonight.  To transition to xarelto per MD's request.  Plan: - d/c heparin - xarelto 15mg  bid for 21 days, then 20mg  daily - monitor for s/s bleeding  Dorna LeitzAnh Chalsey Leeth, PharmD, BCPS 07/22/2014 9:10 PM

## 2014-07-23 LAB — HEPATIC FUNCTION PANEL
ALT: 120 U/L — ABNORMAL HIGH (ref 0–35)
AST: 155 U/L — AB (ref 0–37)
Albumin: 2.6 g/dL — ABNORMAL LOW (ref 3.5–5.2)
Alkaline Phosphatase: 127 U/L — ABNORMAL HIGH (ref 39–117)
BILIRUBIN DIRECT: 0.2 mg/dL (ref 0.0–0.5)
Indirect Bilirubin: 0.2 mg/dL — ABNORMAL LOW (ref 0.3–0.9)
Total Bilirubin: 0.4 mg/dL (ref 0.3–1.2)
Total Protein: 5.8 g/dL — ABNORMAL LOW (ref 6.0–8.3)

## 2014-07-23 LAB — COMPREHENSIVE METABOLIC PANEL
ALBUMIN: 2.5 g/dL — AB (ref 3.5–5.2)
ALK PHOS: 134 U/L — AB (ref 39–117)
ALT: 110 U/L — AB (ref 0–35)
AST: 122 U/L — ABNORMAL HIGH (ref 0–37)
Anion gap: 10 (ref 5–15)
BUN: <5 mg/dL — ABNORMAL LOW (ref 6–23)
CO2: 24 mmol/L (ref 19–32)
Calcium: 8.1 mg/dL — ABNORMAL LOW (ref 8.4–10.5)
Chloride: 110 mmol/L (ref 96–112)
Creatinine, Ser: 0.67 mg/dL (ref 0.50–1.10)
GFR calc Af Amer: >90 mL/min (ref >90)
GFR calc non Af Amer: >90 mL/min (ref >90)
Glucose, Bld: 125 mg/dL — ABNORMAL HIGH (ref 70–99)
POTASSIUM: 3.6 mmol/L (ref 3.5–5.1)
Sodium: 144 mmol/L (ref 135–145)
TOTAL PROTEIN: 5.9 g/dL — AB (ref 6.0–8.3)
Total Bilirubin: 0.3 mg/dL (ref 0.3–1.2)

## 2014-07-23 LAB — BASIC METABOLIC PANEL
Anion gap: 6 (ref 5–15)
CALCIUM: 8.1 mg/dL — AB (ref 8.4–10.5)
CHLORIDE: 107 mmol/L (ref 96–112)
CO2: 24 mmol/L (ref 19–32)
CREATININE: 0.68 mg/dL (ref 0.50–1.10)
GFR calc Af Amer: >90 mL/min (ref >90)
GFR calc non Af Amer: >90 mL/min (ref >90)
GLUCOSE: 145 mg/dL — AB (ref 70–99)
Potassium: 3.2 mmol/L — ABNORMAL LOW (ref 3.5–5.1)
Sodium: 137 mmol/L (ref 135–145)

## 2014-07-23 LAB — FACTOR 5 LEIDEN

## 2014-07-23 LAB — PROTEIN C, TOTAL: Protein C, Total: 75 % (ref 70–140)

## 2014-07-23 NOTE — Progress Notes (Signed)
Patient ID: Carol [name], female   DOB: 13-Feb-1996, 19 y.o.   MRN: [id number]         [facility]    Date of Admission:  07/19/2014   Total days of antibiotics 5        Day 3 ampicillin sulbactam         Principal Problem:   Suppurative pylephlebitis Active Problems:   Abdominal pain   Portal vein thrombosis   Sepsis   Nausea and vomiting   Elevated liver enzymes   Rash   History of splenectomy   Normocytic anemia   Obesity   . ampicillin-sulbactam (UNASYN) IV  3 g Intravenous Q6H  . antiseptic oral rinse  7 mL Mouth Rinse q12n4p  . chlorhexidine  15 mL Mouth Rinse BID  . gabapentin  100 mg Oral TID  . gabapentin  300 mg Oral QHS  . rivaroxaban  15 mg Oral BID WC   Followed by  . [START ON 08/13/2014] rivaroxaban  20 mg Oral Q supper    OBJECTIVE: Blood pressure 115/70, pulse 94, temperature 99.6 F (37.6 C), temperature source Oral, resp. rate 19, height  (1.499 m), weight 179 lb 14.3 oz (81.6 kg), last menstrual period 12/18/2013, SpO2 93 %. General: Carol [name] is sound asleep. I did not wake her. Her nurse reports that Carol [name] did well overnight except feeling uncomfortable when her temp spiked last night to 102.8.  Lab Results Lab Results  Component Value Date   WBC 11.9* 07/22/2014   HGB 11.1* 07/22/2014   HCT 34.5* 07/22/2014   MCV 93.0 07/22/2014   PLT 250 07/22/2014    Lab Results  Component Value Date   CREATININE 0.68 07/23/2014   BUN <5* 07/23/2014   NA 137 07/23/2014   K 3.2* 07/23/2014   CL 107 07/23/2014   CO2 24 07/23/2014    Lab Results  Component Value Date   ALT 120* 07/23/2014   AST 155* 07/23/2014   ALKPHOS 127* 07/23/2014   BILITOT 0.4 07/23/2014     Microbiology: Recent Results (from the past 240 hour(s))  Culture, Urine     Status: None   Collection Time: 07/19/14  6:20 PM  Result Value Ref Range Status   Specimen Description URINE, CLEAN CATCH  Final   Special Requests Normal  Final   Colony Count  NO GROWTH Performed at [facility]   Final   Culture NO GROWTH Performed at [facility]   Final   Report Status 07/21/2014 FINAL  Final  Culture, blood (routine x 2)     Status: None (Preliminary result)   Collection Time: 07/19/14  7:35 PM  Result Value Ref Range Status   Specimen Description BLOOD LEFT ARM  Final   Special Requests BAA 5CC  Final   Culture   Final           BLOOD CULTURE RECEIVED NO GROWTH TO DATE CULTURE WILL BE HELD FOR 5 DAYS BEFORE ISSUING A FINAL NEGATIVE REPORT Performed at [facility]    Report Status PENDING  Incomplete  Culture, blood (routine x 2)     Status: None (Preliminary result)   Collection Time: 07/19/14  7:35 PM  Result Value Ref Range Status   Specimen Description BLOOD RIGHT HAND  Final   Special Requests BOTTLES DRAWN AEROBIC ONLY 10CC  Final   Culture   Final           BLOOD CULTURE RECEIVED NO GROWTH TO  DATE CULTURE WILL BE HELD FOR 5 DAYS BEFORE ISSUING A FINAL NEGATIVE REPORT Performed at [facility]    Report Status PENDING  Incomplete  Culture, blood (x 2)     Status: None (Preliminary result)   Collection Time: 07/20/14  4:05 PM  Result Value Ref Range Status   Specimen Description BLOOD LEFT HAND  Final   Special Requests BOTTLES DRAWN AEROBIC ONLY 6 CC BLUE  Final   Culture   Final           BLOOD CULTURE RECEIVED NO GROWTH TO DATE CULTURE WILL BE HELD FOR 5 DAYS BEFORE ISSUING A FINAL NEGATIVE REPORT Performed at [facility]    Report Status PENDING  Incomplete    Assessment: It is not unusual to have persistent, high fevers with septic thrombophlebitis. I'm still hopeful that Carol [name] will eventually defervesce. Her liver enzymes are up slightly. If her fevers continue and the liver enzymes are up may need to reimage her abdomen to see if Carol [name] has developed liver abscesses. Continue ampicillin sulbactam and rivaroxaban  Plan: 1. Continue ampicillin sulbactam and rivaroxaban 2. I will  have Dr. Daiva EvesVan Dam [phone] follow-up this weekend   Cliffton AstersJohn Salvatrice Morandi, MD [facility] [phone] pager   [phone] cell 07/23/2014, 9:18 AM

## 2014-07-23 NOTE — Discharge Instructions (Addendum)
Information on my medicine - XARELTO (rivaroxaban)  This medication education was reviewed with me or my healthcare representative as part of my discharge preparation.  The pharmacist that spoke with me during my hospital stay [facility]  [facility]  WHY WAS Carol HurlXARELTO PRESCRIBED FOR YOU? Xarelto was prescribed to treat blood clots that may have been found in the veins of your legs (deep vein thrombosis) or in your lungs (pulmonary embolism) and to reduce the risk of them occurring again.  What do you need to know about Xarelto? The starting dose is one 15 mg tablet taken TWICE daily with food for the FIRST 21 DAYS then on (enter date)  08/13/14  the dose is changed to one 20 mg tablet taken ONCE A DAY with your evening meal.  DO NOT stop taking Xarelto without talking to the health care provider who prescribed the medication.  Refill your prescription for 20 mg tablets before you run out.  After discharge, you should have regular check-up appointments with your healthcare provider that is prescribing your Xarelto.  In the future your dose may need to be changed if your kidney function changes by a significant amount.  What do you do if you miss a dose? If you are taking Xarelto TWICE DAILY and you miss a dose, take it as soon as you remember. You may take two 15 mg tablets (total 30 mg) at the same time then resume your regularly scheduled 15 mg twice daily the next day.  If you are taking Xarelto ONCE DAILY and you miss a dose, take it as soon as you remember on the same day then continue your regularly scheduled once daily regimen the next day. Do not take two doses of Xarelto at the same time.   Important Safety Information Xarelto is a blood thinner medicine that can cause bleeding. You should call your healthcare provider right away if you experience any of the following: ? Bleeding from an injury or your nose that does not stop. ? Unusual colored urine (red or dark brown) or unusual  colored stools (red or black). ? Unusual bruising for unknown reasons. ? A serious fall or if you hit your head (even if there is no bleeding).  Some medicines may interact with Xarelto and might increase your risk of bleeding while on Xarelto. To help avoid this, consult your healthcare provider or pharmacist prior to using any new prescription or non-prescription medications, including herbals, vitamins, non-steroidal anti-inflammatory drugs (NSAIDs) and supplements.  This website has more information on Xarelto: [email or web address].xarelto.[email or web address].  Cytomegalovirus Cytomegalovirus (CMV) is a very common virus (a germ that can cause illness) that infects most adults in the [place] by the time they are 20 years old. Most people who have or have had CMV infection never know it because infection with this virus most often does not cause any symptoms. However, once you are infected with CMV, you will have the virus in your body for the rest of your life. Most of the time, and in most people, CMV causes no health problems. However, in certain people, infection can be dangerous. This includes:  Women who are pregnant or plan to be pregnant.  CMV can be passed to an unborn or newly born child and cause health problems soon after birth or as they grow up.  People with a weak immune system (helps the body fight off diseases).  This includes people on drugs that kill cancer cells (chemotherapy), people infected with HIV (the virus that causes  AIDS), people who have had an organ transplant (an operation to give you a new body organ, such as a kidney) or bone marrow transplant and people receiving drugs that suppress the immune system. Anyone who has had CMV infection can pass it on to others. The virus is passed from person to person because it is in body fluids. These include blood, urine, breast milk, spit (saliva) and mens' sexual fluid that contains sperm (semen). If you have had CMV, it is important to take  steps to keep from spreading the virus to others. CAUSES  Adults become infected with CMV by coming in contact with body fluids from someone who already has it. CMV is not passed on by casual contact (such as shaking hands, hugging, kissing on the cheek). It is passed only through body fluids. This can happen if you:  Get body fluids from an infected person on your hands and then rub your eyes or touch the inside of your nose or mouth.  Have sex with someone who has had CMV.  Pryor Montes someone with the virus. That person's saliva would have to enter your mouth.  Receive a blood transfusion. Tests for CMV in blood are not always 100% accurate.  Have an organ or bone marrow transplant. If the donor had CMV, you could become infected, too. SYMPTOMS  Many people with CMV live their whole life and never have symptoms. If symptoms do occur, they may include:  Fever that lasts many days.  Sore throat.  Sore or enlarged glands in the neck and elsewhere in your body (lymph nodes).  Achy muscles.  Feeling very tired.  Feeling weak.  Headache.  Weight loss. People who have weak immune systems usually have more severe symptoms. For these patients, the virus may affect 1 part of the body. For example:  The stomach or intestines, causing an infection (gastroenteritis).  The liver, causing liver disease (hepatitis).  The lungs, causing an illness that makes breathing difficult (pneumonia).  The eyes, making it hard to see.  The brain, causing seizures or coma. This is rare. DIAGNOSIS  To decide if you have or have had CMV infection, your caregiver will probably:  Ask about symptoms you have noticed.  Ask about your overall health.  Do a physical exam.  Order blood tests. They can check for:  Antibodies to the virus. These are substances that fight CMV. They will be in your blood if you have had the virus infection.  Antibodies to the mononucleosis virus that causes a disease with  similar symptoms.  The presence of the CMV virus itself. TREATMENT   No treatment will make the CMV virus go totally away from an infected person. However, many symptoms can be treated. Antiviral drugs may make complications of the infection less severe and can keep the virus from spreading in your body and causing more symptoms.  The medications are sometimes given by IV (through a needle that puts the drug in your vein). This would be done in a hospital or clinic. Sometimes the drugs are taken in pill form. The drugs can have side effects. Talk with your caregiver about the benefits and risks of these drugs.  Immunoglobulin (an antibody) may be prescribed for very bad infections. A high dose often is given. The goal is to help the body keep the virus under control. HOME CARE INSTRUCTIONS   Wash your hands often.  Throw away used tissues. This will keep other people from coming into contact with the virus.  Take any medications prescribed by your caregiver. Follow the directions carefully.  If you also have a weak immune system make sure you understand how to protect your health. Ask your caregiver if there is anything you should or should not do. SEEK MEDICAL CARE IF:   You have questions about any medication that has been prescribed or suggested.  You develop mild symptoms.  You have an oral temperature above 102F (38.9C).  You are extremely tired.  Your muscles and your head ache.  You develop more severe symptoms.  You become confused.  You are very irritable.  Your muscles are unusually weak.  Your vision becomes blurry. You have trouble seeing. Document Released: 03/14/2009 Document Revised: 08/10/2013 Document Reviewed: 03/14/2009 [facility] Patient Information 2015 [place], [place]. This information is not intended to replace advice given to you by your health care provider. Make sure you discuss any questions you have with your health care provider.

## 2014-07-23 NOTE — Progress Notes (Signed)
CARE MANAGEMENT NOTE 07/23/2014  Patient:  Cordelia PocheVANMETER,Nalda   Account Number:  [id number]  Date Initiated:  07/20/2014  Documentation initiated by:  Trinna BalloonMcGIBBONEY,COOKIE [place]  Subjective/Objective Assessment:   pt admitted with cco fever 103     Action/Plan:   from home   Anticipated DC Date:  07/22/2014   Anticipated DC Plan:  HOME W HOME HEALTH SERVICES         Choice offered to / List presented to:          [facility]  [facility]      [facility]  [facility]   Status of service:  In process, will continue to follow Medicare Important Message given?   (If response is "NO", the following Medicare IM given date fields will be blank) Date Medicare IM given:   Medicare IM given by:   Date Additional Medicare IM given:   Additional Medicare IM given by:    Discharge Disposition:    Per UR Regulation:  Reviewed for med. necessity/level of care/duration of stay  If discussed at Long Length of Stay Meetings, dates discussed:    Comments:  07/23/14 MMcGibboney, RN, BSN Pt and mother [facility]   07/20/14 MMcGibboney, RN, BSN Chart reviewed.

## 2014-07-23 NOTE — Progress Notes (Signed)
Patient Demographics  Carol [name], is a 19 y.o. female, DOB - 11/29/1995, MVH:[id number]  Admit date - 07/19/2014   Admitting Physician Calvert CantorSaima Rizwan, MD  Outpatient Primary MD for the patient is Karie ChimeraEESE,BETTI D, MD  LOS - 4   Chief Complaint  Patient presents with  . 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 Abdominal pain was cramp like, 6/10 in intensity, associated with nausea and vomiting. She has seen PCP 07/14/2014 and was given compazine for nausea and vomiting and ibuprofen for pain but with little symptomatic relief. On admission, patient was hemodynamically stable. She was found to have elevated LFT's which prompted abd US study which subsequently revealed portal vein thrombosis. CT abd confirmed this finding as well. She was started on heparin and hematology has seen the patient in consultation. Patient continues to have significant high fever, infectious disease were consulted, patient was thought to have septic portal vein thrombophlebitis, initially she was on IV vancomycin and cefepime given her history of splenectomy, antibiotic were transitioned to IV Unasyn to cover for anaerobes per ID recommendation .  Subjective:   Carol PocheAbigail [name] today has, No headache, No chest pain,  No Nausea, No new weakness tingling or numbness, No Cough - SOB, abdominal pain improved , as well had fever of 103.1 over last 24 hours. Assessment & Plan    Principal Problem:   Suppurative pylephlebitis Active Problems:   Abdominal pain   Portal vein thrombosis   Sepsis   Nausea and vomiting   History of splenectomy   Normocytic  anemia   Obesity   Elevated liver enzymes   Rash  Sepsis/septic portal vein thrombophlebitis - Infectious disease consult greatly appreciated, patient meets clinical and radiographic criteria for septic portal vein thrombophlebitis given her fever, abdominal pain, nausea, vomiting, acute portal vein clot and elevated liver enzyme. - Follow on blood cultures, would be low yield given it was done after IV antibiotics were started, no growth to date. - Antibiotics were changed to IV Unasyn 4/13  to provide additional anaerobic coverage, inserted PICC line in anticipation of IV antibiotics for the first few weeks. - UTI specimen is contaminated,  no growth on urine culture. - Follow-up appointments with hematology on scheduled appointment as an outpatient.  Elevated liver enzymes - This is in the setting of septic portal vein thrombophlebitis, continue to monitor.  Portal vein thrombosis -Patient started initially on heparin drip, transitioned to Xarelto.  Complains of left inner thigh pain with mild erythema. -venous Doppler negative for DVT      Code Status: Full code  Family Communication:   family at bedside  Disposition Plan: Remains inpatient, still having fever    Procedures  None   Consults   Infectious disease  Hematology   Medications  Scheduled Meds: . ampicillin-sulbactam (UNASYN) IV  3 g Intravenous Q6H  . antiseptic oral rinse  7 mL Mouth Rinse q12n4p  . chlorhexidine  15 mL Mouth Rinse BID  . gabapentin  100 mg Oral TID  . gabapentin  300 mg Oral QHS  . rivaroxaban  15 mg Oral BID WC   Followed by  . [START ON 08/13/2014] rivaroxaban  20 mg Oral Q supper   Continuous Infusions: . 0.9 % NaCl with KCl 20 mEq / L 50 mL/hr at 07/22/14 2111   PRN Meds:.acetaminophen, alum & mag hydroxide-simeth, diphenhydrAMINE, ibuprofen, morphine injection, ondansetron (ZOFRAN) IV, sodium chloride  DVT Prophylaxis  and heparin drip  Lab Results  Component Value Date     PLT 250 07/22/2014    Antibiotics   Anti-infectives    Start     Dose/Rate Route Frequency Ordered Stop   07/21/14 1200  Ampicillin-Sulbactam (UNASYN) 3 g in sodium chloride 0.9 % 100 mL IVPB     3 g 100 mL/hr over 60 Minutes Intravenous Every 6 hours 07/21/14 1045     07/20/14 1600  cefTRIAXone (ROCEPHIN) 1 g in dextrose 5 % 50 mL IVPB - Premix  Status:  Discontinued     1 g 100 mL/hr over 30 Minutes Intravenous Every 24 hours 07/19/14 1819 07/20/14 1444   07/20/14 1600  vancomycin (VANCOCIN) IVPB 1000 mg/200 mL premix  Status:  Discontinued     1,000 mg 200 mL/hr over 60 Minutes Intravenous Every 8 hours 07/20/14 1513 07/21/14 1044   07/20/14 1600  ceFEPIme (MAXIPIME) 1 g in dextrose 5 % 50 mL IVPB  Status:  Discontinued     1 g 100 mL/hr over 30 Minutes Intravenous Every 8 hours 07/20/14 1513 07/21/14 1044   07/19/14 1515  cefTRIAXone (ROCEPHIN) 1 g in dextrose 5 % 50 mL IVPB     1 g 100 mL/hr over 30 Minutes Intravenous  Once 07/19/14 1507 07/19/14 1545   07/19/14 1509  cefTRIAXone (ROCEPHIN) 1 G injection    Comments:  Lauro Regulus   : cabinet override      07/19/14 1509 07/19/14 1516          Objective:   Filed Vitals:   07/22/14 2315 07/23/14 0400 07/23/14 0502 07/23/14 0600  BP: 102/55  115/70   Pulse: 97  94   Temp: 99.5 F (37.5 C) 102.8 F (39.3 C) 101.2 F (38.4 C) 99.6 F (37.6 C)  TempSrc: Oral Oral Oral Oral  Resp: 18  19   Height:      Weight:      SpO2: 97%  93%     Wt Readings from Last 3 Encounters:  07/19/14 81.6 kg (179 lb 14.3 oz) (95 %*, Z = 1.60)  06/12/14 77.111 kg (170 lb) (92 %*, Z = 1.40)  04/21/13 55.792 kg (123 lb) (46 %*, Z = -0.09)   * Growth percentiles are based on CDC 2-20 Years data.     Intake/Output Summary (Last 24 hours) at 07/23/14 1352 Last data filed at 07/23/14 0558  Gross per 24 hour  Intake 1918.33 ml  Output      0 ml  Net 1918.33 ml     Physical Exam  comfortable , No new F.N deficits, Normal  affect [place].AT,PERRAL Supple Neck,No JVD, No cervical lymphadenopathy appriciated.  Symmetrical Chest wall movement, Good air movement bilaterally, CTAB RRR,No Gallops,Rubs or new Murmurs, No Parasternal Heave +ve B.Sounds, Abd Soft, abdomen nontender to palpation , No organomegaly appriciated, No rebound - guarding or rigidity. No Cyanosis, Clubbing or edema, No new Rash or bruise    Data Review   Micro Results Recent Results (from the past 240 hour(s))  Culture, Urine  Status: None   Collection Time: 07/19/14  6:20 PM  Result Value Ref Range Status   Specimen Description URINE, CLEAN CATCH  Final   Special Requests Normal  Final   Colony Count NO GROWTH Performed at [facility]   Final   Culture NO GROWTH Performed at [facility]   Final   Report Status 07/21/2014 FINAL  Final  Culture, blood (routine x 2)     Status: None (Preliminary result)   Collection Time: 07/19/14  7:35 PM  Result Value Ref Range Status   Specimen Description BLOOD LEFT ARM  Final   Special Requests BAA 5CC  Final   Culture   Final           BLOOD CULTURE RECEIVED NO GROWTH TO DATE CULTURE WILL BE HELD FOR 5 DAYS BEFORE ISSUING A FINAL NEGATIVE REPORT Performed at [facility]    Report Status PENDING  Incomplete  Culture, blood (routine x 2)     Status: None (Preliminary result)   Collection Time: 07/19/14  7:35 PM  Result Value Ref Range Status   Specimen Description BLOOD RIGHT HAND  Final   Special Requests BOTTLES DRAWN AEROBIC ONLY 10CC  Final   Culture   Final           BLOOD CULTURE RECEIVED NO GROWTH TO DATE CULTURE WILL BE HELD FOR 5 DAYS BEFORE ISSUING A FINAL NEGATIVE REPORT Performed at [facility]    Report Status PENDING  Incomplete  Culture, blood (x 2)     Status: None (Preliminary result)   Collection Time: 07/20/14  4:05 PM  Result Value Ref Range Status   Specimen Description BLOOD LEFT HAND  Final   Special Requests BOTTLES DRAWN  AEROBIC ONLY 6 CC BLUE  Final   Culture   Final           BLOOD CULTURE RECEIVED NO GROWTH TO DATE CULTURE WILL BE HELD FOR 5 DAYS BEFORE ISSUING A FINAL NEGATIVE REPORT Performed at [facility]    Report Status PENDING  Incomplete    Radiology Reports No results found.  CBC  Recent Labs Lab 07/19/14 0930 07/20/14 0442 07/22/14 0501  WBC 10.5 11.8* 11.9*  HGB 12.9 11.3* 11.1*  HCT 38.7 33.9* 34.5*  PLT 278 264 250  MCV 91.1 92.6 93.0  MCH 30.4 30.9 29.9  MCHC 33.3 33.3 32.2  RDW 15.4 15.4 15.9*  LYMPHSABS 4.6*  --   --   MONOABS 0.5  --   --   EOSABS 0.5  --   --   BASOSABS 0.5*  --   --     Chemistries   Recent Labs Lab 07/19/14 0930 07/20/14 0442 07/22/14 0501 07/23/14 0500  NA 140 137 139 137  K 3.6 3.6 4.2 3.2*  CL 108 107 107 107  CO2 GLUCOSE 112* 112* 100* 145*  BUN 6 5* <5* <5*  CREATININE 0.66 0.72 0.64 0.68  CALCIUM 9.1 7.9* 8.4 8.1*  AST 92* 80* 157* 155*  ALT 79* 73* 111* 120*  ALKPHOS 154* 133* 133* 127*  BILITOT 0.1* 0.3 0.7 0.4   ------------------------------------------------------------------------------------------------------------------ estimated creatinine clearance is 104.6 mL/min (by C-G formula based on Cr of 0.68). ------------------------------------------------------------------------------------------------------------------ No results for input(s): HGBA1C in the last 72 hours. ------------------------------------------------------------------------------------------------------------------ No results for input(s): CHOL, HDL, LDLCALC, TRIG, CHOLHDL, LDLDIRECT in the last 72 hours. ------------------------------------------------------------------------------------------------------------------ No results for input(s): TSH, T4TOTAL, T3FREE, THYROIDAB in the last 72 hours.  Invalid input(s):  FREET3 ------------------------------------------------------------------------------------------------------------------ No results for input(s): VITAMINB12, FOLATE, FERRITIN, TIBC, IRON, RETICCTPCT in the last 72 hours.  Coagulation profile No results for input(s): INR, PROTIME in the last 168 hours.  No results for input(s): DDIMER in the last 72 hours.  Cardiac Enzymes No results for input(s): CKMB, TROPONINI, MYOGLOBIN in the last 168 hours.  Invalid input(s): CK ------------------------------------------------------------------------------------------------------------------ Invalid input(s): POCBNP     Time Spent in minutes   30 minutes   Layli Capshaw M.D on 07/23/2014 at 1:52 PM  Between 7am to 7pm - Pager - [phone]  After 7pm go to [email or web address] - password [email or web address]  And look for the night coverage person covering for me after hours  [facility] Office  [phone]   **Disclaimer: This note may have been dictated with voice recognition software. Similar sounding words can inadvertently be transcribed and this note may contain transcription errors which may not have been corrected upon publication of note.**

## 2014-07-24 ENCOUNTER — Inpatient Hospital Stay (HOSPITAL_COMMUNITY): Payer: Medicaid Other

## 2014-07-24 ENCOUNTER — Encounter (HOSPITAL_COMMUNITY): Payer: Self-pay | Admitting: Radiology

## 2014-07-24 DIAGNOSIS — I809 Phlebitis and thrombophlebitis of unspecified site: Secondary | ICD-10-CM

## 2014-07-24 LAB — COMPREHENSIVE METABOLIC PANEL
ALBUMIN: 2.6 g/dL — AB (ref 3.5–5.2)
ALK PHOS: 145 U/L — AB (ref 39–117)
ALT: 94 U/L — ABNORMAL HIGH (ref 0–35)
ANION GAP: 10 (ref 5–15)
AST: 95 U/L — ABNORMAL HIGH (ref 0–37)
BUN: <5 mg/dL — ABNORMAL LOW (ref 6–23)
CALCIUM: 8.3 mg/dL — AB (ref 8.4–10.5)
CO2: 22 mmol/L (ref 19–32)
CREATININE: 0.53 mg/dL (ref 0.50–1.10)
Chloride: 107 mmol/L (ref 96–112)
GFR calc Af Amer: >90 mL/min (ref >90)
GFR calc non Af Amer: >90 mL/min (ref >90)
Glucose, Bld: 95 mg/dL (ref 70–99)
Potassium: 4.4 mmol/L (ref 3.5–5.1)
Sodium: 139 mmol/L (ref 135–145)
Total Bilirubin: 0.7 mg/dL (ref 0.3–1.2)
Total Protein: 6.1 g/dL (ref 6.0–8.3)

## 2014-07-24 LAB — CBC
HCT: 34.6 % — ABNORMAL LOW (ref 36.0–46.0)
HEMOGLOBIN: 11.3 g/dL — AB (ref 12.0–15.0)
MCH: 30.3 pg (ref 26.0–34.0)
MCHC: 32.7 g/dL (ref 30.0–36.0)
MCV: 92.8 fL (ref 78.0–100.0)
PLATELETS: 286 10*3/uL (ref 150–400)
RBC: 3.73 MIL/uL — ABNORMAL LOW (ref 3.87–5.11)
RDW: 16.2 % — ABNORMAL HIGH (ref 11.5–15.5)
WBC: 14.1 10*3/uL — ABNORMAL HIGH (ref 4.0–10.5)

## 2014-07-24 MED ORDER — MORPHINE SULFATE 2 MG/ML IJ SOLN
1.0000 mg | Freq: Four times a day (QID) | INTRAMUSCULAR | Status: DC | PRN
  Administered 2014-07-24 – 2014-07-29 (×10): 2 mg via INTRAVENOUS
  Administered 2014-07-29 – 2014-07-30 (×2): 1 mg via INTRAVENOUS
  Filled 2014-07-24 (×13): qty 1

## 2014-07-24 MED ORDER — IOHEXOL 300 MG/ML  SOLN
50.0000 mL | Freq: Once | INTRAMUSCULAR | Status: AC | PRN
  Administered 2014-07-24: 50 mL via ORAL

## 2014-07-24 MED ORDER — OXYCODONE HCL 5 MG PO TABS
5.0000 mg | ORAL_TABLET | ORAL | Status: DC | PRN
Start: 2014-07-24 — End: 2014-08-02
  Administered 2014-07-24 – 2014-08-01 (×16): 5 mg via ORAL
  Filled 2014-07-24 (×17): qty 1

## 2014-07-24 MED ORDER — ONDANSETRON HCL 4 MG/2ML IJ SOLN
4.0000 mg | Freq: Once | INTRAMUSCULAR | Status: AC
Start: 2014-07-24 — End: 2014-07-24
  Administered 2014-07-24: 4 mg via INTRAVENOUS

## 2014-07-24 MED ORDER — IOHEXOL 300 MG/ML  SOLN
100.0000 mL | Freq: Once | INTRAMUSCULAR | Status: AC | PRN
  Administered 2014-07-24: 100 mL via INTRAVENOUS

## 2014-07-24 NOTE — Progress Notes (Signed)
Patient complains of "not being able to take a deep breath in."  Patient states " I think its because of the pain I am having in my right side of my stomach."  Patient's oxygen saturation is 99% on room air and respiratory rate is between 18-20 breaths per minute.  Patient given pain medication as previously ordered by MD.   Dr. Randol KernElgergawy verbally informed.  Ordered incentive spirometer for patient.  Patient educated on use of incentive spirometer and patient verbalizes and demonstrates understanding of use.  Will continue to monitor patient.

## 2014-07-24 NOTE — Progress Notes (Signed)
[facility]    Subjective:  Patient very upset that there is no firm cause for her thrombosis that has been discovered. She is still having fevers.  They are initially questioning whether or not patient should be trasnfered to tertiary care center--though I do not see clear cut reason for this at this time   Antibiotics:  Anti-infectives    Start     Dose/Rate Route Frequency Ordered Stop   07/21/14 1200  Ampicillin-Sulbactam (UNASYN) 3 g in sodium chloride 0.9 % 100 mL IVPB     3 g 100 mL/hr over 60 Minutes Intravenous Every 6 hours 07/21/14 1045     07/20/14 1600  cefTRIAXone (ROCEPHIN) 1 g in dextrose 5 % 50 mL IVPB - Premix  Status:  Discontinued     1 g 100 mL/hr over 30 Minutes Intravenous Every 24 hours 07/19/14 1819 07/20/14 1444   07/20/14 1600  vancomycin (VANCOCIN) IVPB 1000 mg/200 mL premix  Status:  Discontinued     1,000 mg 200 mL/hr over 60 Minutes Intravenous Every 8 hours 07/20/14 1513 07/21/14 1044   07/20/14 1600  ceFEPIme (MAXIPIME) 1 g in dextrose 5 % 50 mL IVPB  Status:  Discontinued     1 g 100 mL/hr over 30 Minutes Intravenous Every 8 hours 07/20/14 1513 07/21/14 1044   07/19/14 1515  cefTRIAXone (ROCEPHIN) 1 g in dextrose 5 % 50 mL IVPB     1 g 100 mL/hr over 30 Minutes Intravenous  Once 07/19/14 1507 07/19/14 1545   07/19/14 1509  cefTRIAXone (ROCEPHIN) 1 G injection    Comments:  Lauro Regulus   : cabinet override      07/19/14 1509 07/19/14 1516      Medications: Scheduled Meds: . ampicillin-sulbactam (UNASYN) IV  3 g Intravenous Q6H  . antiseptic oral rinse  7 mL Mouth Rinse q12n4p  . chlorhexidine  15 mL Mouth Rinse BID  . gabapentin  100 mg Oral TID  . gabapentin  300 mg Oral QHS  . rivaroxaban  15 mg Oral BID WC   Followed by  . [START ON 08/13/2014] rivaroxaban  20 mg Oral Q supper   Continuous Infusions: . 0.9 % NaCl with KCl 20 mEq / L 50 mL/hr at 07/23/14 2008   PRN Meds:.acetaminophen, alum & mag  hydroxide-simeth, diphenhydrAMINE, ibuprofen, iohexol, morphine injection, ondansetron (ZOFRAN) IV, oxyCODONE, sodium chloride    Objective: Weight change:   Intake/Output Summary (Last 24 hours) at 07/24/14 1725 Last data filed at 07/24/14 1500  Gross per 24 hour  Intake 1901.67 ml  Output      0 ml  Net 1901.67 ml   Blood pressure 123/67, pulse 108, temperature 98.9 F (37.2 C), temperature source Oral, resp. rate 18, height  (1.499 m), weight 179 lb 14.3 oz (81.6 kg), last menstrual period 12/18/2013, SpO2 99 %. Temp:  [97.7 F (36.5 C)-103.1 F (39.5 C)] 98.9 F (37.2 C) (04/16 1501) Pulse Rate:  [91-108] 108 (04/16 1501) Resp:  [18-20] 18 (04/16 1501) BP: (111-123)/(59-67) 123/67 mmHg (04/16 1501) SpO2:  [94 %-99 %] 99 % (04/16 1501)  Physical Exam: General: Alert and awake, oriented x3, anxious. HEENT: anicteric sclera,EOMI CVS tachycardic  rate, normal r,  no murmur rubs or gallops Chest: clear to auscultation bilaterally, no wheezing, rales or rhonchi Abdomen: soft tender on the flank Extremities: no  clubbing or edema noted bilaterally Neuro: nonfocal  CBC:  CBC Latest Ref Rng 07/24/2014 07/22/2014 07/20/2014  WBC 4.0 -  10.5 K/uL 14.1(H) 11.9(H) 11.8(H)  Hemoglobin 12.0 - 15.0 g/dL 11.3(L) 11.1(L) 11.3(L)  Hematocrit 36.0 - 46.0 % 34.6(L) 34.5(L) 33.9(L)  Platelets 150 - 400 K/uL 286 250 264       BMET  Recent Labs  07/23/14 1430 07/24/14 1130  NA 144 139  K 3.6 4.4  CL 110 107  CO2 24 22  GLUCOSE 125* 95  BUN <5* <5*  CREATININE 0.67 0.53  CALCIUM 8.1* 8.3*     Liver Panel   Recent Labs  07/22/14 0501 07/23/14 0500 07/23/14 1430 07/24/14 1130  PROT 6.4 5.8* 5.9* 6.1  ALBUMIN 2.9* 2.6* 2.5* 2.6*  AST 157* 155* 122* 95*  ALT 111* 120* 110* 94*  ALKPHOS 133* 127* 134* 145*  BILITOT 0.7 0.4 0.3 0.7  BILIDIR 0.3 0.2  --   --   IBILI 0.4 0.2*  --   --        Sedimentation Rate No results for input(s): ESRSEDRATE in the last  72 hours. C-Reactive Protein No results for input(s): CRP in the last 72 hours.  Micro Results: Recent Results (from the past 720 hour(s))  Culture, Urine     Status: None   Collection Time: 07/19/14  6:20 PM  Result Value Ref Range Status   Specimen Description URINE, CLEAN CATCH  Final   Special Requests Normal  Final   Colony Count NO GROWTH [facility]   Final   Culture NO GROWTH [facility]   Final   Report Status 07/21/2014 FINAL  Final  Culture, blood (routine x 2)     Status: None (Preliminary result)   Collection Time: 07/19/14  7:35 PM  Result Value Ref Range Status   Specimen Description BLOOD LEFT ARM  Final   Special Requests BAA 5CC  Final   Culture   Final           BLOOD CULTURE RECEIVED NO GROWTH TO DATE CULTURE WILL BE HELD FOR 5 DAYS BEFORE ISSUING A FINAL NEGATIVE REPORT Performed at [facility]    Report Status PENDING  Incomplete  Culture, blood (routine x 2)     Status: None (Preliminary result)   Collection Time: 07/19/14  7:35 PM  Result Value Ref Range Status   Specimen Description BLOOD RIGHT HAND  Final   Special Requests BOTTLES DRAWN AEROBIC ONLY 10CC  Final   Culture   Final           BLOOD CULTURE RECEIVED NO GROWTH TO DATE CULTURE WILL BE HELD FOR 5 DAYS BEFORE ISSUING A FINAL NEGATIVE REPORT Performed at [facility]    Report Status PENDING  Incomplete  Culture, blood (x 2)     Status: None (Preliminary result)   Collection Time: 07/20/14  4:05 PM  Result Value Ref Range Status   Specimen Description BLOOD LEFT HAND  Final   Special Requests BOTTLES DRAWN AEROBIC ONLY 6 CC BLUE  Final   Culture   Final           BLOOD CULTURE RECEIVED NO GROWTH TO DATE CULTURE WILL BE HELD FOR 5 DAYS BEFORE ISSUING A FINAL NEGATIVE REPORT [facility]formed at [facility]    Report Status PENDING  Incomplete    Studies/Results: No results found.    Assessment/Plan:  Principal  Problem:   Suppurative pylephlebitis Active Problems:   Abdominal pain   Portal vein thrombosis   Sepsis   Nausea and vomiting   History of  splenectomy   Normocytic anemia   Obesity   Elevated liver enzymes   Rash    Carol [name] is a 19 y.o. female with with hx of splenectomy on oral contraceptives now with portal vein thrombosis being treated for septic thrombophlebitis  #1 Portal vein thrombosis; anticoagulated, and on unasyn but still febrile.   I will get CT abdomen repeat and also get CT chest  I will order am labs of anticardiolipin ab, ANA, CPK, HIV  Can initiate a more aggressive "FUO workup though her thrombus seems most likely cause of her persistent fevers  Can consider changing abx in case the abx are causing a drug fever --though I doubt it  #2 Sp splenectomy: should make sure she is up to date on vaccines.     LOS: 5 days   Acey Lav 07/24/2014, 5:25 PM

## 2014-07-24 NOTE — Progress Notes (Signed)
Upon entering room, patient laying in bed and appears flushed.  Patient has seven blankets on and room temperature feels warm.  Patient's temperature is 103.1 orally.  Tylenol given as previously ordered by MD.  Patient educated on the importance of not using so many blankets.  All blankets removed but two due to patient stated " I am going to keep just two blankets on because I am so cold from the blood thinners."  Thermostat adjusted in room to decrease heat.  Will continue to monitor patient.

## 2014-07-24 NOTE — Progress Notes (Signed)
Patient c/o " being hot", headache behind the eyes and on top of her head. Vitals were 102.33F (o), HR 123, RR 20, 126/67, O2 Sats 97% on room air. Patient also c/o being nauseated from taking oral contrast.  PCP on call was notified.

## 2014-07-24 NOTE — Progress Notes (Signed)
ANTIBIOTIC CONSULT NOTE - INITIAL  Pharmacy Consult for Unasyn Indication: Septic portal vein thrombophlebitis  Allergies  Allergen Reactions  . Tape Hives    Can use paper tape    Patient Measurements: Height: 4\' 11"  (149.9 cm) Weight: 179 lb 14.3 oz (81.6 kg) IBW/kg (Calculated) : 43.2  Vital Signs: Temp: 101.1 F (38.4 C) (04/16 1255) Temp Source: Oral (04/16 1255) BP: 111/67 mmHg (04/16 0512) Pulse Rate: 91 (04/16 0512) Intake/Output from previous day: 04/15 0701 - 04/16 0700 In: 1751.7 [P.O.:900; I.V.:851.7] Out: -  Intake/Output from this shift:    Labs:  Recent Labs  07/22/14 0501 07/23/14 0500 07/23/14 1430 07/24/14 1130  WBC 11.9*  --   --  14.1*  HGB 11.1*  --   --  11.3*  PLT 250  --   --  286  CREATININE 0.64 0.68 0.67 0.53   Estimated Creatinine Clearance: 104.6 mL/min (by C-G formula based on Cr of 0.53). No results for input(s): VANCOTROUGH, VANCOPEAK, VANCORANDOM, GENTTROUGH, GENTPEAK, GENTRANDOM, TOBRATROUGH, TOBRAPEAK, TOBRARND, AMIKACINPEAK, AMIKACINTROU, AMIKACIN in the last 72 hours.   Microbiology: Recent Results (from the past 720 hour(s))  Culture, Urine     Status: None   Collection Time: 07/19/14  6:20 PM  Result Value Ref Range Status   Specimen Description URINE, CLEAN CATCH  Final   Special Requests Normal  Final   Colony Count NO GROWTH Performed at [facility]   Final   Culture NO GROWTH [facility]formed at [facility]   Final   Report Status 07/21/2014 FINAL  Final  Culture, blood (routine x 2)     Status: None (Preliminary result)   Collection Time: 07/19/14  7:35 PM  Result Value Ref Range Status   Specimen Description BLOOD LEFT ARM  Final   Special Requests BAA 5CC  Final   Culture   Final           BLOOD CULTURE RECEIVED NO GROWTH TO DATE CULTURE WILL BE HELD FOR 5 DAYS BEFORE ISSUING A FINAL NEGATIVE REPORT Performed at [facility]    Report Status PENDING  Incomplete  Culture, blood  (routine x 2)     Status: None (Preliminary result)   Collection Time: 07/19/14  7:35 PM  Result Value Ref Range Status   Specimen Description BLOOD RIGHT HAND  Final   Special Requests BOTTLES DRAWN AEROBIC ONLY 10CC  Final   Culture   Final           BLOOD CULTURE RECEIVED NO GROWTH TO DATE CULTURE WILL BE HELD FOR 5 DAYS BEFORE ISSUING A FINAL NEGATIVE REPORT Performed at [facility]    Report Status PENDING  Incomplete  Culture, blood (x 2)     Status: None (Preliminary result)   Collection Time: 07/20/14  4:05 PM  Result Value Ref Range Status   Specimen Description BLOOD LEFT HAND  Final   Special Requests BOTTLES DRAWN AEROBIC ONLY 6 CC BLUE  Final   Culture   Final           BLOOD CULTURE RECEIVED NO GROWTH TO DATE CULTURE WILL BE HELD FOR 5 DAYS BEFORE ISSUING A FINAL NEGATIVE REPORT Performed at [facility]    Report Status PENDING  Incomplete    Medical History: History reviewed. No pertinent past medical history.  Assessment: [age] yoF with PMH significant for splenectomy at age 89 after trauma, admitted with abdominal pain, vomiting, fevers, and right portal vein thrombosis. She was initially started  on Ceftriaxone for positive UA on admission, but changed to vancomycin and cefepime for r/o sepsis on 4/12.  She continues to have fevers, abdominal pain, leukocytosis.  Pharmacy is now consulted to dose Unasyn per ID consult for suspected septic portal vein thrombophlebitis.  4/11 >> Ceftriaxone x 1 4/12 >> Cefepime >> 4/13 4/12 >> Vancomycin >> 4/13 4/13 >> Unasyn >>  Tmax: 103.1 (remains febrile, APAP and Ibu PRN)  WBC: elevated, increased  Renal: SCr 0.53, CrCl >100 ml/min   4/11, 4/12 Blood Cxt x3: ngtd  4/11 Urine Cxt: NGF   Today is day #4 Unasyn 3g q6h (D6 abx total) for septic portal vein thrombophlebitis.   Goal of Therapy:  Appropriate abx dosing, eradication of infection.   Plan:   Continue Unasyn 3g IV q6h  Follow up renal function,  cultures, ID recommendations.   Clance Boll, PharmD, BCPS Pager: [phone] 07/24/2014 1:01 PM

## 2014-07-24 NOTE — Progress Notes (Signed)
Patient Demographics  Carol [name], is a 19 y.o. female, DOB - 01-10-1996, ZOX:[id number]  Admit date - 07/19/2014   Admitting Physician Calvert Cantor, MD  Outpatient Primary MD for the patient is [name], MD  LOS - 5   Chief Complaint  Patient presents with  . 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 Abdominal pain was cramp like, 6/10 in intensity, associated with nausea and vomiting. She has seen PCP 07/14/2014 and was given compazine for nausea and vomiting and ibuprofen for pain but with little symptomatic relief. On admission, patient was hemodynamically stable. She was found to have elevated LFT's which prompted abd [place] study which subsequently revealed portal vein thrombosis. CT abd confirmed this finding as well. She was started on heparin and hematology has seen the patient in consultation. Patient continues to have significant high fever, infectious disease were consulted, patient was thought to have septic portal vein thrombophlebitis, initially she was on IV vancomycin and cefepime given her history of splenectomy, antibiotic were transitioned to IV Unasyn to cover for anaerobes per ID recommendation .  Subjective:   Carol [name] today has, No headache, No chest pain,  No Nausea, No new weakness tingling or numbness, No Cough - SOB, still complaining of abdominal pain, still having fever. Assessment & Plan    Principal Problem:   Suppurative pylephlebitis Active Problems:   Abdominal pain   Portal vein thrombosis   Sepsis   Nausea and vomiting   History of splenectomy   Normocytic anemia    Obesity   Elevated liver enzymes   Rash  Sepsis/septic portal vein thrombophlebitis - Infectious disease consult greatly appreciated, patient meets clinical and radiographic criteria for septic portal vein thrombophlebitis given her fever, abdominal pain, nausea, vomiting, acute portal vein clot and elevated liver enzyme. - Follow on blood cultures, would be low yield given it was done after IV antibiotics were started, no growth to date. - Antibiotics were changed to IV Unasyn 4/13  to provide additional anaerobic coverage, inserted PICC line in anticipation of IV antibiotics for the first few weeks. - UTI specimen is contaminated,  no growth on urine culture. - Follow-up appointments with hematology on scheduled appointment as an outpatient. - Recheck right upper quadrant ultrasound given patient still having fever, and LFTs still elevated.  Elevated liver enzymes - This is in the setting of septic portal vein thrombophlebitis, continue to monitor. - Will recheck right upper quadrant ultrasound  Portal vein thrombosis -Patient started initially on heparin drip, transitioned to Xarelto.  Complains of left inner thigh pain with mild erythema. -venous Doppler negative for DVT      Code Status: Full code  Family Communication:   family at bedside  Disposition Plan: Remains inpatient, still having fever    Procedures  None   Consults   Infectious disease  Hematology   Medications  Scheduled Meds: . ampicillin-sulbactam (UNASYN) IV  3 g Intravenous Q6H  . antiseptic oral rinse  7 mL  Mouth Rinse q12n4p  . chlorhexidine  15 mL Mouth Rinse BID  . gabapentin  100 mg Oral TID  . gabapentin  300 mg Oral QHS  . rivaroxaban  15 mg Oral BID WC   Followed by  . [START ON 08/13/2014] rivaroxaban  20 mg Oral Q supper   Continuous Infusions: . 0.9 % NaCl with KCl 20 mEq / L 50 mL/hr at 07/23/14 2008   PRN Meds:.acetaminophen, alum & mag hydroxide-simeth, diphenhydrAMINE,  ibuprofen, morphine injection, ondansetron (ZOFRAN) IV, oxyCODONE, sodium chloride  DVT Prophylaxis  and heparin drip  Lab Results  Component Value Date   PLT 286 07/24/2014    Antibiotics   Anti-infectives    Start     Dose/Rate Route Frequency Ordered Stop   07/21/14 1200  Ampicillin-Sulbactam (UNASYN) 3 g in sodium chloride 0.9 % 100 mL IVPB     3 g 100 mL/hr over 60 Minutes Intravenous Every 6 hours 07/21/14 1045     07/20/14 1600  cefTRIAXone (ROCEPHIN) 1 g in dextrose 5 % 50 mL IVPB - Premix  Status:  Discontinued     1 g 100 mL/hr over 30 Minutes Intravenous Every 24 hours 07/19/14 1819 07/20/14 1444   07/20/14 1600  vancomycin (VANCOCIN) IVPB 1000 mg/200 mL premix  Status:  Discontinued     1,000 mg 200 mL/hr over 60 Minutes Intravenous Every 8 hours 07/20/14 1513 07/21/14 1044   07/20/14 1600  ceFEPIme (MAXIPIME) 1 g in dextrose 5 % 50 mL IVPB  Status:  Discontinued     1 g 100 mL/hr over 30 Minutes Intravenous Every 8 hours 07/20/14 1513 07/21/14 1044   07/19/14 1515  cefTRIAXone (ROCEPHIN) 1 g in dextrose 5 % 50 mL IVPB     1 g 100 mL/hr over 30 Minutes Intravenous  Once 07/19/14 1507 07/19/14 1545   07/19/14 1509  cefTRIAXone (ROCEPHIN) 1 G injection    Comments:  Lauro RegulusReich, Carol   : cabinet override      07/19/14 1509 07/19/14 1516          Objective:   Filed Vitals:   07/24/14 0233 07/24/14 0512 07/24/14 1137 07/24/14 1255  BP:  111/67    Pulse:  91    Temp: 101.2 F (38.4 C) 97.7 F (36.5 C) 103.1 F (39.5 C) 101.1 F (38.4 C)  TempSrc: Oral Oral Oral Oral  Resp:  20    Height:      Weight:      SpO2:  99%      Wt Readings from Last 3 Encounters:  07/19/14 81.6 kg (179 lb 14.3 oz) (95 %*, Z = 1.60)  06/12/14 77.111 kg (170 lb) (92 %*, Z = 1.40)  04/21/13 55.792 kg (123 lb) (46 %*, Z = -0.09)   * Growth percentiles are based on CDC 2-20 Years data.     Intake/Output Summary (Last 24 hours) at 07/24/14 1418 Last data filed at 07/24/14 0512   Gross per 24 hour  Intake 1751.67 ml  Output      0 ml  Net 1751.67 ml     Physical Exam  comfortable , No new F.N deficits, Normal affect [place].AT,PERRAL Supple Neck,No JVD, No cervical lymphadenopathy appriciated.  Symmetrical Chest wall movement, Good air movement bilaterally, CTAB RRR,No Gallops,Rubs or new Murmurs, No Parasternal Heave +ve B.Sounds, Abd Soft, mild tenderness to palpation , No organomegaly appriciated, No rebound - guarding or rigidity. No Cyanosis, Clubbing or edema, No new Rash or bruise    Data  Review   Micro Results Recent Results (from the past 240 hour(s))  Culture, Urine     Status: None   Collection Time: 07/19/14  6:20 PM  Result Value Ref Range Status   Specimen Description URINE, CLEAN CATCH  Final   Special Requests Normal  Final   Colony Count NO GROWTH Performed at [facility]   Final   Culture NO GROWTH Performed at [facility]   Final   Report Status 07/21/2014 FINAL  Final  Culture, blood (routine x 2)     Status: None (Preliminary result)   Collection Time: 07/19/14  7:35 PM  Result Value Ref Range Status   Specimen Description BLOOD LEFT ARM  Final   Special Requests BAA 5CC  Final   Culture   Final           BLOOD CULTURE RECEIVED NO GROWTH TO DATE CULTURE WILL BE HELD FOR 5 DAYS BEFORE ISSUING A FINAL NEGATIVE REPORT Performed at [facility]    Report Status PENDING  Incomplete  Culture, blood (routine x 2)     Status: None (Preliminary result)   Collection Time: 07/19/14  7:35 PM  Result Value Ref Range Status   Specimen Description BLOOD RIGHT HAND  Final   Special Requests BOTTLES DRAWN AEROBIC ONLY 10CC  Final   Culture   Final           BLOOD CULTURE RECEIVED NO GROWTH TO DATE CULTURE WILL BE HELD FOR 5 DAYS BEFORE ISSUING A FINAL NEGATIVE REPORT Performed at [facility]    Report Status PENDING  Incomplete  Culture, blood (x 2)     Status: None (Preliminary result)   Collection Time:  07/20/14  4:05 PM  Result Value Ref Range Status   Specimen Description BLOOD LEFT HAND  Final   Special Requests BOTTLES DRAWN AEROBIC ONLY 6 CC BLUE  Final   Culture   Final           BLOOD CULTURE RECEIVED NO GROWTH TO DATE CULTURE WILL BE HELD FOR 5 DAYS BEFORE ISSUING A FINAL NEGATIVE REPORT Performed at [facility]    Report Status PENDING  Incomplete    Radiology Reports No results found.  CBC  Recent Labs Lab 07/19/14 0930 07/20/14 0442 07/22/14 0501 07/24/14 1130  WBC 10.5 11.8* 11.9* 14.1*  HGB 12.9 11.3* 11.1* 11.3*  HCT 38.7 33.9* 34.5* 34.6*  PLT 278 264 250 286  MCV 91.1 92.6 93.0 92.8  MCH 30.4 30.9 29.9 30.3  MCHC 33.3 33.3 32.2 32.7  RDW 15.4 15.4 15.9* 16.2*  LYMPHSABS 4.6*  --   --   --   MONOABS 0.5  --   --   --   EOSABS 0.5  --   --   --   BASOSABS 0.5*  --   --   --     Chemistries   Recent Labs Lab 07/20/14 0442 07/22/14 0501 07/23/14 0500 07/23/14 1430 07/24/14 1130  NA 137 139 137 144 139  K 3.6 4.2 3.2* 3.6 4.4  CL 107 107 107 110 107  CO2 GLUCOSE 112* 100* 145* 125* 95  BUN 5* <5* <5* <5* <5*  CREATININE 0.72 0.64 0.68 0.67 0.53  CALCIUM 7.9* 8.4 8.1* 8.1* 8.3*  AST 80* 157* 155* 122* 95*  ALT 73* 111* 120* 110* 94*  ALKPHOS 133* 133* 127* 134* 145*  BILITOT 0.3 0.7 0.4 0.3 0.7   ------------------------------------------------------------------------------------------------------------------  estimated creatinine clearance is 104.6 mL/min (by C-G formula based on Cr of 0.53). ------------------------------------------------------------------------------------------------------------------ No results for input(s): HGBA1C in the last 72 hours. ------------------------------------------------------------------------------------------------------------------ No results for input(s): CHOL, HDL, LDLCALC, TRIG, CHOLHDL, LDLDIRECT in the last 72  hours. ------------------------------------------------------------------------------------------------------------------ No results for input(s): TSH, T4TOTAL, T3FREE, THYROIDAB in the last 72 hours.  Invalid input(s): FREET3 ------------------------------------------------------------------------------------------------------------------ No results for input(s): VITAMINB12, FOLATE, FERRITIN, TIBC, IRON, RETICCTPCT in the last 72 hours.  Coagulation profile No results for input(s): INR, PROTIME in the last 168 hours.  No results for input(s): DDIMER in the last 72 hours.  Cardiac Enzymes No results for input(s): CKMB, TROPONINI, MYOGLOBIN in the last 168 hours.  Invalid input(s): CK ------------------------------------------------------------------------------------------------------------------ Invalid input(s): POCBNP     Time Spent in minutes   30 minutes   Leronda Lewers M.D on 07/24/2014 at 2:18 PM  Between 7am to 7pm - Pager - [phone]  After 7pm go to [email or web address] - password [email or web address]  And look for the night coverage person covering for me after hours  [facility] Office  [phone]   **Disclaimer: This note may have been dictated with voice recognition software. Similar sounding words can inadvertently be transcribed and this note may contain transcription errors which may not have been corrected upon publication of note.**

## 2014-07-25 DIAGNOSIS — I82409 Acute embolism and thrombosis of unspecified deep veins of unspecified lower extremity: Secondary | ICD-10-CM | POA: Insufficient documentation

## 2014-07-25 DIAGNOSIS — F064 Anxiety disorder due to known physiological condition: Secondary | ICD-10-CM

## 2014-07-25 LAB — COMPREHENSIVE METABOLIC PANEL
ALT: 75 U/L — ABNORMAL HIGH (ref 0–35)
ANION GAP: 6 (ref 5–15)
AST: 61 U/L — ABNORMAL HIGH (ref 0–37)
Albumin: 2.5 g/dL — ABNORMAL LOW (ref 3.5–5.2)
Alkaline Phosphatase: 130 U/L — ABNORMAL HIGH (ref 39–117)
BILIRUBIN TOTAL: 0.4 mg/dL (ref 0.3–1.2)
CO2: 26 mmol/L (ref 19–32)
Calcium: 8 mg/dL — ABNORMAL LOW (ref 8.4–10.5)
Chloride: 106 mmol/L (ref 96–112)
Creatinine, Ser: 0.74 mg/dL (ref 0.50–1.10)
GFR calc Af Amer: >90 mL/min (ref >90)
Glucose, Bld: 110 mg/dL — ABNORMAL HIGH (ref 70–99)
Potassium: 3.6 mmol/L (ref 3.5–5.1)
SODIUM: 138 mmol/L (ref 135–145)
Total Protein: 5.9 g/dL — ABNORMAL LOW (ref 6.0–8.3)

## 2014-07-25 LAB — CBC
HEMATOCRIT: 32.1 % — AB (ref 36.0–46.0)
Hemoglobin: 10.5 g/dL — ABNORMAL LOW (ref 12.0–15.0)
MCH: 30.3 pg (ref 26.0–34.0)
MCHC: 32.7 g/dL (ref 30.0–36.0)
MCV: 92.8 fL (ref 78.0–100.0)
PLATELETS: 307 10*3/uL (ref 150–400)
RBC: 3.46 MIL/uL — ABNORMAL LOW (ref 3.87–5.11)
RDW: 16.3 % — ABNORMAL HIGH (ref 11.5–15.5)
WBC: 16.3 10*3/uL — ABNORMAL HIGH (ref 4.0–10.5)

## 2014-07-25 LAB — HEPATITIS PANEL, ACUTE
HCV Ab: NEGATIVE
HEP A IGM: NONREACTIVE
HEP B C IGM: NONREACTIVE
HEP B S AG: NEGATIVE

## 2014-07-25 LAB — FERRITIN: FERRITIN: 193 ng/mL (ref 10–291)

## 2014-07-25 LAB — HIV ANTIBODY (ROUTINE TESTING W REFLEX): HIV Screen 4th Generation wRfx: NONREACTIVE

## 2014-07-25 LAB — LACTATE DEHYDROGENASE: LDH: 366 U/L — AB (ref 94–250)

## 2014-07-25 LAB — CK: Total CK: 28 U/L (ref 7–177)

## 2014-07-25 MED ORDER — METRONIDAZOLE 500 MG PO TABS
500.0000 mg | ORAL_TABLET | Freq: Two times a day (BID) | ORAL | Status: DC
  Administered 2014-07-25 – 2014-07-26 (×2): 500 mg via ORAL
  Filled 2014-07-25 (×2): qty 1

## 2014-07-25 MED ORDER — POTASSIUM CHLORIDE CRYS ER 20 MEQ PO TBCR
40.0000 meq | EXTENDED_RELEASE_TABLET | Freq: Once | ORAL | Status: AC
  Administered 2014-07-25: 40 meq via ORAL
  Filled 2014-07-25: qty 2

## 2014-07-25 MED ORDER — CIPROFLOXACIN HCL 500 MG PO TABS
500.0000 mg | ORAL_TABLET | Freq: Two times a day (BID) | ORAL | Status: DC
  Administered 2014-07-25 – 2014-07-26 (×2): 500 mg via ORAL
  Filled 2014-07-25 (×2): qty 1

## 2014-07-25 NOTE — Progress Notes (Signed)
Patient Demographics  Carol [name], is a 19 y.o. female, DOB - 1995/10/26, NWG:[id number]  Admit date - 07/19/2014   Admitting Physician Calvert Cantor, MD  Outpatient Primary MD for the patient is [name], MD  LOS - 6   Chief Complaint  Patient presents with  . 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 Abdominal pain was cramp like, 6/10 in intensity, associated with nausea and vomiting. She has seen PCP 07/14/2014 and was given compazine for nausea and vomiting and ibuprofen for pain but with little symptomatic relief. On admission, patient was hemodynamically stable. She was found to have elevated LFT's which prompted abd [place] study which subsequently revealed portal vein thrombosis. CT abd confirmed this finding as well. She was started on heparin and hematology has seen the patient in consultation. Patient continues to have significant high fever, infectious disease were consulted, patient was thought to have septic portal vein thrombophlebitis, initially she was on IV vancomycin and cefepime given her history of splenectomy, antibiotic were transitioned to IV Unasyn to cover for anaerobes per ID recommendation .  Subjective:   Carol [name] today has, No headache, No chest pain,  No Nausea, No new weakness tingling or numbness, No Cough - SOB, still complaining of abdominal pain, still having fever. Denies any diarrhea.  Assessment & Plan    Principal Problem:   Suppurative pylephlebitis Active Problems:   Abdominal pain   Portal vein thrombosis   Sepsis   Nausea and vomiting   History of splenectomy    Normocytic anemia   Obesity   Elevated liver enzymes   Rash   Septic thrombophlebitis  Sepsis/septic portal vein thrombophlebitis - Infectious disease consult greatly appreciated, patient meets clinical and radiographic criteria for septic portal vein thrombophlebitis given her fever, abdominal pain, nausea, vomiting, acute portal vein clot and elevated liver enzyme. - Follow on blood cultures, would be low yield given it was done after IV antibiotics were started, no growth to date. - Antibiotics were changed to IV Unasyn 4/13  to provide additional anaerobic coverage, inserted PICC line in anticipation of IV antibiotics for the first few weeks. - UTI specimen is contaminated,  no growth on urine culture. - Follow-up appointments with hematology on scheduled appointment as an outpatient. - Repeat CT abdomen and pelvis showing persistent thrombus in the right portal vein and right hepatic lobe portal vein branch, and mild reactive mesenteric adenopathy ,no evidence of any liver abscess.  Elevated liver enzymes - This is in the setting of septic portal vein thrombophlebitis, continue to monitor.   Portal vein thrombosis -Patient started initially on heparin drip, transitioned to Xarelto.  Complains of left inner thigh pain with mild erythema. -venous Doppler negative for DVT      Code Status: Full code  Family Communication:   family at bedside  Disposition Plan: Remains inpatient, still having fever    Procedures  None   Consults   Infectious disease  Hematology   Medications  Scheduled Meds: . ampicillin-sulbactam (UNASYN) IV  3 g Intravenous Q6H  . antiseptic oral rinse  7 mL Mouth Rinse q12n4p  . chlorhexidine  15 mL Mouth Rinse BID  . gabapentin  100 mg Oral TID  . gabapentin  300 mg Oral QHS  . potassium chloride  40 mEq Oral Once  . rivaroxaban  15 mg Oral BID WC   Followed by  . [START ON 08/13/2014] rivaroxaban  20 mg Oral Q supper   Continuous  Infusions: . 0.9 % NaCl with KCl 20 mEq / L 50 mL/hr at 07/24/14 2028   PRN Meds:.acetaminophen, alum & mag hydroxide-simeth, diphenhydrAMINE, ibuprofen, morphine injection, ondansetron (ZOFRAN) IV, oxyCODONE, sodium chloride  DVT Prophylaxis  and heparin drip  Lab Results  Component Value Date   PLT 307 07/25/2014    Antibiotics   Anti-infectives    Start     Dose/Rate Route Frequency Ordered Stop   07/21/14 1200  Ampicillin-Sulbactam (UNASYN) 3 g in sodium chloride 0.9 % 100 mL IVPB     3 g 100 mL/hr over 60 Minutes Intravenous Every 6 hours 07/21/14 1045     07/20/14 1600  cefTRIAXone (ROCEPHIN) 1 g in dextrose 5 % 50 mL IVPB - Premix  Status:  Discontinued     1 g 100 mL/hr over 30 Minutes Intravenous Every 24 hours 07/19/14 1819 07/20/14 1444   07/20/14 1600  vancomycin (VANCOCIN) IVPB 1000 mg/200 mL premix  Status:  Discontinued     1,000 mg 200 mL/hr over 60 Minutes Intravenous Every 8 hours 07/20/14 1513 07/21/14 1044   07/20/14 1600  ceFEPIme (MAXIPIME) 1 g in dextrose 5 % 50 mL IVPB  Status:  Discontinued     1 g 100 mL/hr over 30 Minutes Intravenous Every 8 hours 07/20/14 1513 07/21/14 1044   07/19/14 1515  cefTRIAXone (ROCEPHIN) 1 g in dextrose 5 % 50 mL IVPB     1 g 100 mL/hr over 30 Minutes Intravenous  Once 07/19/14 1507 07/19/14 1545   07/19/14 1509  cefTRIAXone (ROCEPHIN) 1 G injection    Comments:  Reich, Hazel   : cabinet override      07/19/14 1509 07/19/14 1516          Objective:   Filed Vitals:   07/24/14 2347 07/25/14 0535 07/25/14 0623 07/25/14 1351  BP: 119/65 106/80  105/62  Pulse: 104 119  110  Temp: 98.9 F (37.2 C) 102.6 F (39.2 C) 97.6 F (36.4 C) 99.2 F (37.3 C)  TempSrc: Oral Oral Oral Oral  Resp: 18 18  18   Height:      Weight:      SpO2: 97% 96%      Wt Readings from Last 3 Encounters:  07/19/14 81.6 kg (179 lb 14.3 oz) (95 %*, Z = 1.60)  06/12/14 77.111 kg (170 lb) (92 %*, Z = 1.40)  04/21/13 55.792 kg (123 lb) (46 %*,  Z = -0.09)   * Growth percentiles are based on CDC 2-20 Years data.     Intake/Output Summary (Last 24 hours) at 07/25/14 1454 Last data filed at 07/25/14 0619  Gross per 24 hour  Intake 1895.84 ml  Output    450 ml  Net 1445.84 ml     Physical Exam  comfortable , No new F.N deficits, Normal affect [place].AT,PERRAL Supple Neck,No JVD, No cervical lymphadenopathy appriciated.  Symmetrical Chest wall movement, Good air movement bilaterally, CTAB RRR,No Gallops,Rubs or new Murmurs, No Parasternal Heave +ve B.Sounds, Abd Soft, mild tenderness  to palpation , No organomegaly appriciated, No rebound - guarding or rigidity. No Cyanosis, Clubbing or edema, No new Rash or bruise    Data Review   Micro Results Recent Results (from the past 240 hour(s))  Culture, Urine     Status: None   Collection Time: 07/19/14  6:20 PM  Result Value Ref Range Status   Specimen Description URINE, CLEAN CATCH  Final   Special Requests Normal  Final   Colony Count NO GROWTH Performed at [facility]   Final   Culture NO GROWTH Performed at [facility]   Final   Report Status 07/21/2014 FINAL  Final  Culture, blood (routine x 2)     Status: None (Preliminary result)   Collection Time: 07/19/14  7:35 PM  Result Value Ref Range Status   Specimen Description BLOOD LEFT ARM  Final   Special Requests BAA 5CC  Final   Culture   Final           BLOOD CULTURE RECEIVED NO GROWTH TO DATE CULTURE WILL BE HELD FOR 5 DAYS BEFORE ISSUING A FINAL NEGATIVE REPORT Performed at [facility]    Report Status PENDING  Incomplete  Culture, blood (routine x 2)     Status: None (Preliminary result)   Collection Time: 07/19/14  7:35 PM  Result Value Ref Range Status   Specimen Description BLOOD RIGHT HAND  Final   Special Requests BOTTLES DRAWN AEROBIC ONLY 10CC  Final   Culture   Final           BLOOD CULTURE RECEIVED NO GROWTH TO DATE CULTURE WILL BE HELD FOR 5 DAYS BEFORE ISSUING A FINAL  NEGATIVE REPORT Performed at [facility]    Report Status PENDING  Incomplete  Culture, blood (x 2)     Status: None (Preliminary result)   Collection Time: 07/20/14  4:05 PM  Result Value Ref Range Status   Specimen Description BLOOD LEFT HAND  Final   Special Requests BOTTLES DRAWN AEROBIC ONLY 6 CC BLUE  Final   Culture   Final           BLOOD CULTURE RECEIVED NO GROWTH TO DATE CULTURE WILL BE HELD FOR 5 DAYS BEFORE ISSUING A FINAL NEGATIVE REPORT Performed at [facility]    Report Status PENDING  Incomplete  Culture, blood (routine x 2)     Status: None (Preliminary result)   Collection Time: 07/24/14  3:40 PM  Result Value Ref Range Status   Specimen Description BLOOD LEFT ARM  Final   Special Requests BOTTLES DRAWN AEROBIC ONLY 3CC AEROBIC BOTTLE ONLY  Final   Culture   Final           BLOOD CULTURE RECEIVED NO GROWTH TO DATE CULTURE WILL BE HELD FOR 5 DAYS BEFORE ISSUING A FINAL NEGATIVE REPORT Performed at [facility]    Report Status PENDING  Incomplete  Culture, blood (routine x 2)     Status: None (Preliminary result)   Collection Time: 07/24/14  4:16 PM  Result Value Ref Range Status   Specimen Description BLOOD RIGHT HAND  Final   Special Requests   Final    BOTTLES DRAWN AEROBIC AND ANAEROBIC 10CC BLUE BOTTLE, 5CC RED BOTTLE   Culture   Final           BLOOD CULTURE RECEIVED NO GROWTH TO DATE CULTURE WILL BE HELD FOR 5 DAYS BEFORE ISSUING A FINAL NEGATIVE REPORT Performed at [facility]    Report Status PENDING  Incomplete    Radiology Reports Ct Chest W Contrast  07/24/2014   CLINICAL DATA:  Portal vein thrombosis and fevers. Clinically, the patient has septic thrombophlebitis. 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 Admitted with diffuse abdominal pain and fever as high as 103. Elevated liver function tests.  EXAM: CT CHEST, ABDOMEN, AND PELVIS WITH CONTRAST  TECHNIQUE: Multidetector CT imaging of the  chest, abdomen and pelvis was performed following the standard protocol during bolus administration of intravenous contrast.  CONTRAST:  100mL OMNIPAQUE IOHEXOL 300 MG/ML  SOLN  COMPARISON:  Abdomen and pelvis CT dated 07/19/2014.  FINDINGS: CT CHEST FINDINGS  Left subclavian catheter tip at the superior cavoatrial junction. Thoracic spine fixation hardware and scoliosis. Minimal dependent atelectasis in both lower lobes. Otherwise, clear lungs. Minimal right pleural fluid. No lung nodules or enlarged lymph nodes.  CT ABDOMEN AND PELVIS FINDINGS  Surgically absent spleen. Persistent thrombus in the distal right portal vein and right hepatic lobe portal vein branch. Poorly distended gallbladder. Normal appearing pancreas, adrenal glands, kidneys, urinary bladder, uterus and ovaries. No gastrointestinal abnormalities. Mildly enlarged lymph nodes at the root of the mesentery without significant change. The largest has a short axis diameter of 8 mm on image number 58. Mild to moderate levoconvex lumbar rotary scoliosis.  IMPRESSION: 1. Persistent thrombus in the right portal vein and right hepatic lobe portal vein branch. 2. Stable mild reactive mesenteric adenopathy. 3. Minimal right pleural effusion.   Electronically Signed   By: Beckie SaltsSteven  Reid M.D.   On: 07/24/2014 19:04   Ct Abdomen Pelvis W Contrast  07/24/2014   CLINICAL DATA:  Portal vein thrombosis and fevers. Clinically, the patient has septic thrombophlebitis. 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 Admitted with diffuse abdominal pain and fever as high as 103. Elevated liver function tests.  EXAM: CT CHEST, ABDOMEN, AND PELVIS WITH CONTRAST  TECHNIQUE: Multidetector CT imaging of the chest, abdomen and pelvis was performed following the standard protocol during bolus administration of intravenous contrast.  CONTRAST:  100mL OMNIPAQUE IOHEXOL 300 MG/ML  SOLN  COMPARISON:  Abdomen and pelvis CT dated 07/19/2014.  FINDINGS: CT CHEST FINDINGS   Left subclavian catheter tip at the superior cavoatrial junction. Thoracic spine fixation hardware and scoliosis. Minimal dependent atelectasis in both lower lobes. Otherwise, clear lungs. Minimal right pleural fluid. No lung nodules or enlarged lymph nodes.  CT ABDOMEN AND PELVIS FINDINGS  Surgically absent spleen. Persistent thrombus in the distal right portal vein and right hepatic lobe portal vein branch. Poorly distended gallbladder. Normal appearing pancreas, adrenal glands, kidneys, urinary bladder, uterus and ovaries. No gastrointestinal abnormalities. Mildly enlarged lymph nodes at the root of the mesentery without significant change. The largest has a short axis diameter of 8 mm on image number 58. Mild to moderate levoconvex lumbar rotary scoliosis.  IMPRESSION: 1. Persistent thrombus in the right portal vein and right hepatic lobe portal vein branch. 2. Stable mild reactive mesenteric adenopathy. 3. Minimal right pleural effusion.   Electronically Signed   By: Beckie SaltsSteven  Reid M.D.   On: 07/24/2014 19:04    CBC  Recent Labs Lab 07/19/14 0930 07/20/14 0442 07/22/14 0501 07/24/14 1130 07/25/14 0415  WBC 10.5 11.8* 11.9* 14.1* 16.3*  HGB 12.9 11.3* 11.1* 11.3* 10.5*  HCT 38.7 33.9* 34.5* 34.6* 32.1*  PLT 278 264 250 286 307  MCV 91.1 92.6 93.0 92.8 92.8  MCH 30.4 30.9 29.9 30.3 30.3  MCHC 33.3 33.3 32.2 32.7 32.7  RDW  15.4 15.4 15.9* 16.2* 16.3*  LYMPHSABS 4.6*  --   --   --   --   MONOABS 0.5  --   --   --   --   EOSABS 0.5  --   --   --   --   BASOSABS 0.5*  --   --   --   --     Chemistries   Recent Labs Lab 07/22/14 0501 07/23/14 0500 07/23/14 1430 07/24/14 1130 07/25/14 0415  NA 139 137 144 139 138  K 4.2 3.2* 3.6 4.4 3.6  CL 107 107 110 107 106  CO2 GLUCOSE 100* 145* 125* 95 110*  BUN <5* <5* <5* <5* <5*  CREATININE 0.64 0.68 0.67 0.53 0.74  CALCIUM 8.4 8.1* 8.1* 8.3* 8.0*  AST 157* 155* 122* 95* 61*  ALT 111* 120* 110* 94* 75*  ALKPHOS 133*  127* 134* 145* 130*  BILITOT 0.7 0.4 0.3 0.7 0.4   ------------------------------------------------------------------------------------------------------------------ estimated creatinine clearance is 104.6 mL/min (by C-G formula based on Cr of 0.74). ------------------------------------------------------------------------------------------------------------------ No results for input(s): HGBA1C in the last 72 hours. ------------------------------------------------------------------------------------------------------------------ No results for input(s): CHOL, HDL, LDLCALC, TRIG, CHOLHDL, LDLDIRECT in the last 72 hours. ------------------------------------------------------------------------------------------------------------------ No results for input(s): TSH, T4TOTAL, T3FREE, THYROIDAB in the last 72 hours.  Invalid input(s): FREET3 ------------------------------------------------------------------------------------------------------------------ No results for input(s): VITAMINB12, FOLATE, FERRITIN, TIBC, IRON, RETICCTPCT in the last 72 hours.  Coagulation profile No results for input(s): INR, PROTIME in the last 168 hours.  No results for input(s): DDIMER in the last 72 hours.  Cardiac Enzymes No results for input(s): CKMB, TROPONINI, MYOGLOBIN in the last 168 hours.  Invalid input(s): CK ------------------------------------------------------------------------------------------------------------------ Invalid input(s): POCBNP     Time Spent in minutes   30 minutes   Libbie Bartley M.D on 07/25/2014 at 2:54 PM  Between 7am to 7pm - Pager - [phone]  After 7pm go to [email or web address] - password [email or web address]  And look for the night coverage person covering for me after hours  [facility] Office  [phone]   **Disclaimer: This note may have been dictated with voice recognition software. Similar sounding words can inadvertently be transcribed and this note may  contain transcription errors which may not have been corrected upon publication of note.**

## 2014-07-25 NOTE — Progress Notes (Signed)
[facility]    Subjective:  Patient AGAIN very upset "I am NOT ANY BETTER!"  Again stating that she will go to [facility] if we cannot "figure out what is wrong."  C/o HA, pleuritic type chest pain  Having HIVES that respond to benadryl   Antibiotics:  Anti-infectives    Start     Dose/Rate Route Frequency Ordered Stop   07/25/14 2000  ciprofloxacin (CIPRO) tablet 500 mg     500 mg Oral 2 times daily 07/25/14 1530     07/25/14 2000  metroNIDAZOLE (FLAGYL) tablet 500 mg     500 mg Oral Every 12 hours 07/25/14 1530     07/21/14 1200  Ampicillin-Sulbactam (UNASYN) 3 g in sodium chloride 0.9 % 100 mL IVPB  Status:  Discontinued     3 g 100 mL/hr over 60 Minutes Intravenous Every 6 hours 07/21/14 1045 07/25/14 1530   07/20/14 1600  cefTRIAXone (ROCEPHIN) 1 g in dextrose 5 % 50 mL IVPB - Premix  Status:  Discontinued     1 g 100 mL/hr over 30 Minutes Intravenous Every 24 hours 07/19/14 1819 07/20/14 1444   07/20/14 1600  vancomycin (VANCOCIN) IVPB 1000 mg/200 mL premix  Status:  Discontinued     1,000 mg 200 mL/hr over 60 Minutes Intravenous Every 8 hours 07/20/14 1513 07/21/14 1044   07/20/14 1600  ceFEPIme (MAXIPIME) 1 g in dextrose 5 % 50 mL IVPB  Status:  Discontinued     1 g 100 mL/hr over 30 Minutes Intravenous Every 8 hours 07/20/14 1513 07/21/14 1044   07/19/14 1515  cefTRIAXone (ROCEPHIN) 1 g in dextrose 5 % 50 mL IVPB     1 g 100 mL/hr over 30 Minutes Intravenous  Once 07/19/14 1507 07/19/14 1545   07/19/14 1509  cefTRIAXone (ROCEPHIN) 1 G injection    Comments:  Lauro Regulus   : cabinet override      07/19/14 1509 07/19/14 1516      Medications: Scheduled Meds: . antiseptic oral rinse  7 mL Mouth Rinse q12n4p  . chlorhexidine  15 mL Mouth Rinse BID  . ciprofloxacin  500 mg Oral BID  . gabapentin  100 mg Oral TID  . gabapentin  300 mg Oral QHS  . metroNIDAZOLE  500 mg Oral Q12H  . rivaroxaban  15 mg Oral BID WC   Followed by  . [START  ON 08/13/2014] rivaroxaban  20 mg Oral Q supper   Continuous Infusions: . 0.9 % NaCl with KCl 20 mEq / L 50 mL/hr at 07/24/14 2028   PRN Meds:.acetaminophen, alum & mag hydroxide-simeth, diphenhydrAMINE, ibuprofen, morphine injection, ondansetron (ZOFRAN) IV, oxyCODONE, sodium chloride    Objective: Weight change:   Intake/Output Summary (Last 24 hours) at 07/25/14 1637 Last data filed at 07/25/14 1500  Gross per 24 hour  Intake 2025.84 ml  Output   1025 ml  Net 1000.84 ml   Blood pressure 109/78, pulse 105, temperature 101.4 F (38.6 C), temperature source Oral, resp. rate 20, height  (1.499 m), weight 179 lb 14.3 oz (81.6 kg), last menstrual period 12/18/2013, SpO2 94 %. Temp:  [97.6 F (36.4 C)-102.7 F (39.3 C)] 101.4 F (38.6 C) (04/17 1543) Pulse Rate:  [104-123] 105 (04/17 1543) Resp:  [18-20] 20 (04/17 1543) BP: (105-126)/(62-80) 109/78 mmHg (04/17 1543) SpO2:  [94 %-97 %] 94 % (04/17 1543)  Physical Exam: General: Alert and awake, oriented x3, anxious.and upset HEENT: anicteric sclera,EOMI CVS tachycardic  rate, normal  r,  no murmur rubs or gallops Chest: clear to auscultation bilaterally, no wheezing, rales or rhonchi Abdomen: soft tender on the flank Skin: resolving hives Neuro: nonfocal  CBC:  CBC Latest Ref Rng 07/25/2014 07/24/2014 07/22/2014  WBC 4.0 - 10.5 K/uL 16.3(H) 14.1(H) 11.9(H)  Hemoglobin 12.0 - 15.0 g/dL 10.5(L) 11.3(L) 11.1(L)  Hematocrit 36.0 - 46.0 % 32.1(L) 34.6(L) 34.5(L)  Platelets 150 - 400 K/uL 307 286 250       BMET  Recent Labs  07/24/14 1130 07/25/14 0415  NA 139 138  K 4.4 3.6  CL 107 106  CO2 22 26  GLUCOSE 95 110*  BUN <5* <5*  CREATININE 0.53 0.74  CALCIUM 8.3* 8.0*     Liver Panel   Recent Labs  07/23/14 0500  07/24/14 1130 07/25/14 0415  PROT 5.8*  < > 6.1 5.9*  ALBUMIN 2.6*  < > 2.6* 2.5*  AST 155*  < > 95* 61*  ALT 120*  < > 94* 75*  ALKPHOS 127*  < > 145* 130*  BILITOT 0.4  < > 0.7 0.4    BILIDIR 0.2  --   --   --   IBILI 0.2*  --   --   --   < > = values in this interval not displayed.     Sedimentation Rate No results for input(s): ESRSEDRATE in the last 72 hours. C-Reactive Protein No results for input(s): CRP in the last 72 hours.  Micro Results: Recent Results (from the past 720 hour(s))  Culture, Urine     Status: None   Collection Time: 07/19/14  6:20 PM  Result Value Ref Range Status   Specimen Description URINE, CLEAN CATCH  Final   Special Requests Normal  Final   Colony Count NO GROWTH Performed at [facility]   Final   Culture NO GROWTH Performed at [facility]   Final   Report Status 07/21/2014 FINAL  Final  Culture, blood (routine x 2)     Status: None (Preliminary result)   Collection Time: 07/19/14  7:35 PM  Result Value Ref Range Status   Specimen Description BLOOD LEFT ARM  Final   Special Requests BAA 5CC  Final   Culture   Final           BLOOD CULTURE RECEIVED NO GROWTH TO DATE CULTURE WILL BE HELD FOR 5 DAYS BEFORE ISSUING A FINAL NEGATIVE REPORT Performed at [facility]    Report Status PENDING  Incomplete  Culture, blood (routine x 2)     Status: None (Preliminary result)   Collection Time: 07/19/14  7:35 PM  Result Value Ref Range Status   Specimen Description BLOOD RIGHT HAND  Final   Special Requests BOTTLES DRAWN AEROBIC ONLY 10CC  Final   Culture   Final           BLOOD CULTURE RECEIVED NO GROWTH TO DATE CULTURE WILL BE HELD FOR 5 DAYS BEFORE ISSUING A FINAL NEGATIVE REPORT Performed at [facility]    Report Status PENDING  Incomplete  Culture, blood (x 2)     Status: None (Preliminary result)   Collection Time: 07/20/14  4:05 PM  Result Value Ref Range Status   Specimen Description BLOOD LEFT HAND  Final   Special Requests BOTTLES DRAWN AEROBIC ONLY 6 CC BLUE  Final   Culture   Final           BLOOD CULTURE RECEIVED NO GROWTH TO DATE CULTURE WILL  BE HELD FOR 5 DAYS BEFORE ISSUING A  FINAL NEGATIVE REPORT Performed at [facility]    Report Status PENDING  Incomplete  Culture, blood (routine x 2)     Status: None (Preliminary result)   Collection Time: 07/24/14  3:40 PM  Result Value Ref Range Status   Specimen Description BLOOD LEFT ARM  Final   Special Requests BOTTLES DRAWN AEROBIC ONLY 3CC AEROBIC BOTTLE ONLY  Final   Culture   Final           BLOOD CULTURE RECEIVED NO GROWTH TO DATE CULTURE WILL BE HELD FOR 5 DAYS BEFORE ISSUING A FINAL NEGATIVE REPORT Performed at [facility]    Report Status PENDING  Incomplete  Culture, blood (routine x 2)     Status: None (Preliminary result)   Collection Time: 07/24/14  4:16 PM  Result Value Ref Range Status   Specimen Description BLOOD RIGHT HAND  Final   Special Requests   Final    BOTTLES DRAWN AEROBIC AND ANAEROBIC 10CC BLUE BOTTLE, 5CC RED BOTTLE   Culture   Final           BLOOD CULTURE RECEIVED NO GROWTH TO DATE CULTURE WILL BE HELD FOR 5 DAYS BEFORE ISSUING A FINAL NEGATIVE REPORT Performed at [facility]    Report Status PENDING  Incomplete    Studies/Results: Ct Chest W Contrast  07/24/2014   CLINICAL DATA:  Portal vein thrombosis and fevers. Clinically, the patient has septic thrombophlebitis. 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 Admitted with diffuse abdominal pain and fever as high as 103. Elevated liver function tests.  EXAM: CT CHEST, ABDOMEN, AND PELVIS WITH CONTRAST  TECHNIQUE: Multidetector CT imaging of the chest, abdomen and pelvis was performed following the standard protocol during bolus administration of intravenous contrast.  CONTRAST:  OMNIPAQUE IOHEXOL 300 MG/ML  SOLN  COMPARISON:  Abdomen and pelvis CT dated 07/19/2014.  FINDINGS: CT CHEST FINDINGS  Left subclavian catheter tip at the superior cavoatrial junction. Thoracic spine fixation hardware and scoliosis. Minimal dependent atelectasis in both lower lobes. Otherwise, clear lungs. Minimal  right pleural fluid. No lung nodules or enlarged lymph nodes.  CT ABDOMEN AND PELVIS FINDINGS  Surgically absent spleen. Persistent thrombus in the distal right portal vein and right hepatic lobe portal vein branch. Poorly distended gallbladder. Normal appearing pancreas, adrenal glands, kidneys, urinary bladder, uterus and ovaries. No gastrointestinal abnormalities. Mildly enlarged lymph nodes at the root of the mesentery without significant change. The largest has a short axis diameter of 8 mm on image number 58. Mild to moderate levoconvex lumbar rotary scoliosis.  IMPRESSION: 1. Persistent thrombus in the right portal vein and right hepatic lobe portal vein branch. 2. Stable mild reactive mesenteric adenopathy. 3. Minimal right pleural effusion.   Electronically Signed   By: Beckie Salts M.D.   On: 07/24/2014 19:04   Ct Abdomen Pelvis W Contrast  07/24/2014   CLINICAL DATA:  Portal vein thrombosis and fevers. Clinically, the patient has septic thrombophlebitis. 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 Admitted with diffuse abdominal pain and fever as high as 103. Elevated liver function tests.  EXAM: CT CHEST, ABDOMEN, AND PELVIS WITH CONTRAST  TECHNIQUE: Multidetector CT imaging of the chest, abdomen and pelvis was performed following the standard protocol during bolus administration of intravenous contrast.  CONTRAST:  OMNIPAQUE IOHEXOL 300 MG/ML  SOLN  COMPARISON:  Abdomen and pelvis CT dated 07/19/2014.  FINDINGS: CT CHEST FINDINGS  Left subclavian catheter tip  at the superior cavoatrial junction. Thoracic spine fixation hardware and scoliosis. Minimal dependent atelectasis in both lower lobes. Otherwise, clear lungs. Minimal right pleural fluid. No lung nodules or enlarged lymph nodes.  CT ABDOMEN AND PELVIS FINDINGS  Surgically absent spleen. Persistent thrombus in the distal right portal vein and right hepatic lobe portal vein branch. Poorly distended gallbladder. Normal  appearing pancreas, adrenal glands, kidneys, urinary bladder, uterus and ovaries. No gastrointestinal abnormalities. Mildly enlarged lymph nodes at the root of the mesentery without significant change. The largest has a short axis diameter of 8 mm on image number 58. Mild to moderate levoconvex lumbar rotary scoliosis.  IMPRESSION: 1. Persistent thrombus in the right portal vein and right hepatic lobe portal vein branch. 2. Stable mild reactive mesenteric adenopathy. 3. Minimal right pleural effusion.   Electronically Signed   By: Beckie SaltsSteven  Reid M.D.   On: 07/24/2014 19:04      Assessment/Plan:  Principal Problem:   Suppurative pylephlebitis Active Problems:   Abdominal pain   Portal vein thrombosis   Sepsis   Nausea and vomiting   History of splenectomy   Normocytic anemia   Obesity   Elevated liver enzymes   Rash   Septic thrombophlebitis    Carol [name] is a 19 y.o. female with with hx of splenectomy on oral contraceptives now with portal vein thrombosis being treated for septic thrombophlebitis  #1 "FUO" and Portal vein thrombosis; anticoagulated, and on unasyn but still febrile and VERY UPSET. CT of the chest and abdomen yesterday DID NOT show any NEW source for her fevers beyond her portal vein thrombus  She is also unhappy that her WBC has not gone down and as mentioned stating that she will go to [facility]  I will initiate a more aggressive FUO workup though I informed both the patient and her mother that the OVERWHELMING evidence from her history would point to the thrombus as being the cause of her fever  In addition to the CPK nml, and HIV, ANA that are pending I will send RF, Cryoglobulins, CMV, EBV, Hepatitis panel, SPEP  I will check a 2D echo in case she has a massive PE with RV strain (I do not see value in getting a CTA at this point)  I have also offered to obtain an MRI of her brain due to her headaches though I think this along with the above mentioned tests are  ALL likely to be of VERY LOW YIELD  I am changing her to CIPRO and FLAGYL in case her Beta Lactams have anything to do with her Fevers   #2 Leukocytosis: see above discussion. Could ask Heme/Onc to further opine since the pt and family are under the impression that not enough is being done to workup her condition   #3 Sp splenectomy: should make sure she is up to date on vaccines.  I spent greater than 40 minutes with the patient including greater than 50% of time in face to face counsel of the patient and in coordination of their care. I have tried to emphasize to the patient that being PATIENT is what is needed most here and that undoubtedly her fevers will resolve but she is clearly not hearing me.  In the end it may be best to consider transfer to [facility] though I think that will likely lead to further waste of resources and do not see what it is about this patients condition that would require expertise at a tertiary care center  Dr. Orvan Falconerampbell is  back tomorrow.     LOS: 6 days   Acey Lav 07/25/2014, 4:37 PM

## 2014-07-25 NOTE — Progress Notes (Signed)
ANTICOAGULATION CONSULT NOTE  Pharmacy Consult for Xarelto Indication: portal vein thrombosis  Allergies  Allergen Reactions  . Tape Hives    Can use paper tape    Patient Measurements: Height: 4\' 11"  (149.9 cm) Weight: 179 lb 14.3 oz (81.6 kg) IBW/kg (Calculated) : 43.2   Vital Signs: Temp: 99.2 F (37.3 C) (04/17 1351) Temp Source: Oral (04/17 1351) BP: 105/62 mmHg (04/17 1351) Pulse Rate: 110 (04/17 1351)  Labs:  Recent Labs  07/23/14 1430 07/24/14 1130 07/25/14 0415  HGB  --  11.3* 10.5*  HCT  --  34.6* 32.1*  PLT  --  286 307  CREATININE 0.67 0.53 0.74  CKTOTAL  --   --  28    Estimated Creatinine Clearance: 104.6 mL/min (by C-G formula based on Cr of 0.74).  Medication: . 0.9 % NaCl with KCl 20 mEq / L 50 mL/hr at 07/24/14 2028     Assessment: 18 yo[facility] with fever, N/V, abdominal pain.  PMH includes traumatic splenectomy at age 89.  CT confirmed portal vein thrombosis.  Hematology has been consulted for hypercoagulable workup and [facility]  Of note, she takes oral contraceptives (started 12/2013) with most recent dose on 4/11.  Pharmacy was initially consulted to dose Heparin IV and transitioned to Xarelto 4/14 after insertion of PICC.  Today, 07/25/2014:  Xarelto 15 mg PO BID with meals x 21 days then transition to 20 mg daily with supper on 08/13/14.  CBC: Hgb 10.5, Plt 250  Renal: CrCl > 100 ml/min  AST/ALT elevated but improving No bleeding/complications reported.  Xarelto education provided 4/15.  Goal of Therapy:  VTE Treatment   Plan:  Continue Xarelto as ordered.  Pharmacy will continue to follow but will sign off from leaving progress notes.  Clance BollAmanda Shanya Ferriss, PharmD, BCPS Pager: [phone] 07/25/2014 2:22 PM

## 2014-07-25 NOTE — Progress Notes (Signed)
Again the room was warm, there were 3 blankets on the patient and the patient had a fever of 102.11F (orally). A couple of blankets were taken off the patient and the patient was reminded not to over do the blankets on the bed because they are contributing to the patient "feeling flushed".

## 2014-07-25 NOTE — Plan of Care (Signed)
Problem: Consults Goal: General Medical Patient Education See Patient Education Module for specific education.  Hand out on Medications was given to patient (07/25/14)

## 2014-07-26 DIAGNOSIS — R509 Fever, unspecified: Secondary | ICD-10-CM

## 2014-07-26 LAB — CBC
HCT: 32.5 % — ABNORMAL LOW (ref 36.0–46.0)
Hemoglobin: 10.4 g/dL — ABNORMAL LOW (ref 12.0–15.0)
MCH: 30.1 pg (ref 26.0–34.0)
MCHC: 32 g/dL (ref 30.0–36.0)
MCV: 93.9 fL (ref 78.0–100.0)
Platelets: 335 10*3/uL (ref 150–400)
RBC: 3.46 MIL/uL — ABNORMAL LOW (ref 3.87–5.11)
RDW: 16.7 % — ABNORMAL HIGH (ref 11.5–15.5)
WBC: 16.8 10*3/uL — ABNORMAL HIGH (ref 4.0–10.5)

## 2014-07-26 LAB — DIFFERENTIAL
BASOS PCT: 2 % — AB (ref 0–1)
Basophils Absolute: 0.3 10*3/uL — ABNORMAL HIGH (ref 0.0–0.1)
EOS PCT: 6 % — AB (ref 0–5)
Eosinophils Absolute: 1 10*3/uL — ABNORMAL HIGH (ref 0.0–0.7)
Lymphocytes Relative: 63 % — ABNORMAL HIGH (ref 12–46)
Lymphs Abs: 10.6 10*3/uL — ABNORMAL HIGH (ref 0.7–4.0)
MONO ABS: 1.2 10*3/uL — AB (ref 0.1–1.0)
Monocytes Relative: 7 % (ref 3–12)
Neutro Abs: 3.7 10*3/uL (ref 1.7–7.7)
Neutrophils Relative %: 22 % — ABNORMAL LOW (ref 43–77)

## 2014-07-26 LAB — COMPREHENSIVE METABOLIC PANEL
ALK PHOS: 116 U/L (ref 39–117)
ALT: 56 U/L — AB (ref 0–35)
AST: 54 U/L — ABNORMAL HIGH (ref 0–37)
Albumin: 2.4 g/dL — ABNORMAL LOW (ref 3.5–5.2)
Anion gap: 6 (ref 5–15)
BUN: <5 mg/dL — ABNORMAL LOW (ref 6–23)
CALCIUM: 7.7 mg/dL — AB (ref 8.4–10.5)
CO2: 24 mmol/L (ref 19–32)
Chloride: 106 mmol/L (ref 96–112)
Creatinine, Ser: 0.67 mg/dL (ref 0.50–1.10)
GFR calc Af Amer: >90 mL/min (ref >90)
GFR calc non Af Amer: >90 mL/min (ref >90)
GLUCOSE: 114 mg/dL — AB (ref 70–99)
POTASSIUM: 3.7 mmol/L (ref 3.5–5.1)
Sodium: 136 mmol/L (ref 135–145)
Total Bilirubin: 0.4 mg/dL (ref 0.3–1.2)
Total Protein: 5.7 g/dL — ABNORMAL LOW (ref 6.0–8.3)

## 2014-07-26 LAB — CULTURE, BLOOD (ROUTINE X 2)
CULTURE: NO GROWTH
CULTURE: NO GROWTH
Culture: NO GROWTH

## 2014-07-26 LAB — EPSTEIN-BARR VIRUS VCA ANTIBODY PANEL
EBV NA IGG: 83.2 U/mL — AB (ref 0.0–17.9)
EBV VCA IgG: 96.4 U/mL — ABNORMAL HIGH (ref 0.0–17.9)
EBV VCA IgM: 52.4 U/mL — ABNORMAL HIGH (ref 0.0–35.9)

## 2014-07-26 LAB — CMV ANTIBODY, IGG (EIA): CMV AB - IGG: 0.88 U/mL — AB (ref 0.00–0.59)

## 2014-07-26 LAB — RPR: RPR: NONREACTIVE

## 2014-07-26 LAB — RHEUMATOID FACTOR: Rhuematoid fact SerPl-aCnc: 10.6 IU/mL (ref 0.0–13.9)

## 2014-07-26 LAB — CLOSTRIDIUM DIFFICILE BY PCR: CDIFFPCR: NEGATIVE

## 2014-07-26 LAB — CMV IGM: CMV IgM: >240 AU/mL — ABNORMAL HIGH (ref 0.0–29.9)

## 2014-07-26 LAB — PROTHROMBIN GENE MUTATION

## 2014-07-26 MED ORDER — CIPROFLOXACIN IN D5W 400 MG/200ML IV SOLN
400.0000 mg | Freq: Two times a day (BID) | INTRAVENOUS | Status: DC
  Administered 2014-07-26 – 2014-07-28 (×4): 400 mg via INTRAVENOUS
  Filled 2014-07-26 (×4): qty 200

## 2014-07-26 MED ORDER — METRONIDAZOLE IN NACL 5-0.79 MG/ML-% IV SOLN
500.0000 mg | Freq: Three times a day (TID) | INTRAVENOUS | Status: DC
  Administered 2014-07-26 – 2014-07-28 (×6): 500 mg via INTRAVENOUS
  Filled 2014-07-26 (×6): qty 100

## 2014-07-26 NOTE — Progress Notes (Signed)
Patient Demographics  Carol [name], is a 19 y.o. female, DOB - 10/15/1995, ZOX:[id number]  Admit date - 07/19/2014   Admitting Physician Calvert Cantor, MD  Outpatient Primary MD for the patient is [name], MD  LOS - 7   Chief Complaint  Patient presents with  . 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 Abdominal pain was cramp like, 6/10 in intensity, associated with nausea and vomiting. She has seen PCP 07/14/2014 and was given compazine for nausea and vomiting and ibuprofen for pain but with little symptomatic relief. On admission, patient was hemodynamically stable. She was found to have elevated LFT's which prompted abd [place] study which subsequently revealed portal vein thrombosis. CT abd confirmed this finding as well. She was started on heparin and hematology has seen the patient in consultation. Patient continues to have significant high fever, infectious disease were consulted, patient was thought to have septic portal vein thrombophlebitis, initially she was on IV vancomycin and cefepime given her history of splenectomy, antibiotic were transitioned to IV Unasyn to cover for anaerobes per ID recommendation , then transitioned to IV Cipro and Flagyl.  Subjective:   Carol [name] today has, No headache, No chest pain,  No Nausea, No new weakness tingling or numbness, No Cough - SOB, still complaining of abdominal pain, still having fever. Having multiple loose stools, but not watery.  Assessment & Plan    Principal Problem:   Suppurative pylephlebitis Active Problems:   Abdominal pain   Portal vein thrombosis   Sepsis   Nausea and vomiting   History of splenectomy   Normocytic anemia   Obesity   Elevated liver enzymes   Rash   Septic thrombophlebitis   DVT (deep venous thrombosis)  Sepsis/septic portal vein thrombophlebitis - Infectious disease consult greatly appreciated, patient meets clinical and radiographic criteria for septic portal vein thrombophlebitis given her fever, abdominal pain, nausea, vomiting, acute portal vein clot and elevated liver enzyme. - Follow on blood cultures, would be low yield given it was done after IV antibiotics were started, no growth to date. - Antibiotics were changed to IV Unasyn 4/13  to provide additional anaerobic coverage, inserted PICC line in anticipation of IV antibiotics for the first few weeks. - UTI specimen is contaminated,  no growth on urine culture. - Follow-up appointments with hematology on scheduled appointment as an outpatient. - Repeat CT abdomen and pelvis showing persistent thrombus in the right portal vein and right hepatic lobe portal vein branch, (which would explain her persistent fever) and mild reactive mesenteric adenopathy ,no evidence of any liver abscess. - Transitioned to IV ciprofloxacin and Flagyl for possible superimposed component of drug fever. - We'll check C. difficile by PCR giving multiple loose bowel movements.  Elevated liver enzymes - This is in the setting of septic portal vein thrombophlebitis, continue to monitor.   Portal vein thrombosis -Patient started initially on heparin drip, transitioned to Xarelto.  Complains of left inner thigh pain with mild erythema. -venous  Doppler negative for DVT      Code Status: Full code  Family Communication:   Mother at bedside  Disposition Plan: Remains inpatient, still having fever    Procedures  None   Consults   Infectious disease  Hematology   Medications  Scheduled Meds: . antiseptic oral rinse  7 mL Mouth Rinse q12n4p  . chlorhexidine  15 mL  Mouth Rinse BID  . ciprofloxacin  400 mg Intravenous Q12H  . gabapentin  100 mg Oral TID  . gabapentin  300 mg Oral QHS  . metronidazole  500 mg Intravenous Q8H  . rivaroxaban  15 mg Oral BID WC   Followed by  . [START ON 08/13/2014] rivaroxaban  20 mg Oral Q supper   Continuous Infusions: . 0.9 % NaCl with KCl 20 mEq / L 50 mL/hr (07/26/14 1321)   PRN Meds:.acetaminophen, alum & mag hydroxide-simeth, diphenhydrAMINE, ibuprofen, morphine injection, ondansetron (ZOFRAN) IV, oxyCODONE, sodium chloride  DVT Prophylaxis  and heparin drip  Lab Results  Component Value Date   PLT 335 07/26/2014    Antibiotics   Anti-infectives    Start     Dose/Rate Route Frequency Ordered Stop   07/26/14 1300  ciprofloxacin (CIPRO) IVPB 400 mg     400 mg 200 mL/hr over 60 Minutes Intravenous Every 12 hours 07/26/14 1226     07/26/14 1300  metroNIDAZOLE (FLAGYL) IVPB 500 mg     500 mg 100 mL/hr over 60 Minutes Intravenous Every 8 hours 07/26/14 1226     07/25/14 2000  ciprofloxacin (CIPRO) tablet 500 mg  Status:  Discontinued     500 mg Oral 2 times daily 07/25/14 1530 07/26/14 1226   07/25/14 2000  metroNIDAZOLE (FLAGYL) tablet 500 mg  Status:  Discontinued     500 mg Oral Every 12 hours 07/25/14 1530 07/26/14 1226   07/21/14 1200  Ampicillin-Sulbactam (UNASYN) 3 g in sodium chloride 0.9 % 100 mL IVPB  Status:  Discontinued     3 g 100 mL/hr over 60 Minutes Intravenous Every 6 hours 07/21/14 1045 07/25/14 1530   07/20/14 1600  cefTRIAXone (ROCEPHIN) 1 g in dextrose 5 % 50 mL IVPB - Premix  Status:  Discontinued     1 g 100 mL/hr over 30 Minutes Intravenous Every 24 hours 07/19/14 1819 07/20/14 1444   07/20/14 1600  vancomycin (VANCOCIN) IVPB 1000 mg/200 mL premix  Status:  Discontinued     1,000 mg 200 mL/hr over 60 Minutes Intravenous Every 8 hours 07/20/14 1513 07/21/14 1044   07/20/14 1600  ceFEPIme (MAXIPIME) 1 g in dextrose 5 % 50 mL IVPB  Status:  Discontinued     1 g 100 mL/hr over 30  Minutes Intravenous Every 8 hours 07/20/14 1513 07/21/14 1044   07/19/14 1515  cefTRIAXone (ROCEPHIN) 1 g in dextrose 5 % 50 mL IVPB     1 g 100 mL/hr over 30 Minutes Intravenous  Once 07/19/14 1507 07/19/14 1545   07/19/14 1509  cefTRIAXone (ROCEPHIN) 1 G injection    Comments:  Reich, Hazel   : cabinet override      07/19/14 1509 07/19/14 1516          Objective:   Filed Vitals:   07/25/14 2147 07/26/14 0044 07/26/14 0201 07/26/14 0530  BP: 103/61   100/54  Pulse: 95   88  Temp: 99.1 F (37.3 C) 102.8 F (39.3 C) 98.1 F (36.7 C) 97.4 F (36.3 C)  TempSrc: Oral Oral Oral Oral  Resp: 20  20  Height:      Weight:      SpO2: 95%   95%    Wt Readings from Last 3 Encounters:  07/19/14 81.6 kg (179 lb 14.3 oz) (95 %*, Z = 1.60)  06/12/14 77.111 kg (170 lb) (92 %*, Z = 1.40)  04/21/13 55.792 kg (123 lb) (46 %*, Z = -0.09)   * Growth percentiles are based on CDC 2-20 Years data.     Intake/Output Summary (Last 24 hours) at 07/26/14 1349 Last data filed at 07/26/14 0700  Gross per 24 hour  Intake   2040 ml  Output    575 ml  Net   1465 ml     Physical Exam  comfortable , No new F.N deficits, Normal affect [place].AT,PERRAL Supple Neck,No JVD, No cervical lymphadenopathy appriciated.  Symmetrical Chest wall movement, Good air movement bilaterally, CTAB RRR,No Gallops,Rubs or new Murmurs, No Parasternal Heave +ve B.Sounds, Abd Soft, mild tenderness to palpation , No organomegaly appriciated, No rebound - guarding or rigidity. No Cyanosis, Clubbing or edema, No new Rash or bruise    Data Review   Micro Results Recent Results (from the past 240 hour(s))  Culture, Urine     Status: None   Collection Time: 07/19/14  6:20 PM  Result Value Ref Range Status   Specimen Description URINE, CLEAN CATCH  Final   Special Requests Normal  Final   Colony Count NO GROWTH Performed at [facility]   Final   Culture NO GROWTH Performed at [facility]   Final    Report Status 07/21/2014 FINAL  Final  Culture, blood (routine x 2)     Status: None   Collection Time: 07/19/14  7:35 PM  Result Value Ref Range Status   Specimen Description BLOOD LEFT ARM  Final   Special Requests BAA 5CC  Final   Culture   Final    NO GROWTH 5 DAYS Performed at [facility]    Report Status 07/26/2014 FINAL  Final  Culture, blood (routine x 2)     Status: None   Collection Time: 07/19/14  7:35 PM  Result Value Ref Range Status   Specimen Description BLOOD RIGHT HAND  Final   Special Requests BOTTLES DRAWN AEROBIC ONLY 10CC  Final   Culture   Final    NO GROWTH 5 DAYS Performed at [facility]    Report Status 07/26/2014 FINAL  Final  Culture, blood (x 2)     Status: None   Collection Time: 07/20/14  4:05 PM  Result Value Ref Range Status   Specimen Description BLOOD LEFT HAND  Final   Special Requests BOTTLES DRAWN AEROBIC ONLY 6 CC BLUE  Final   Culture   Final    NO GROWTH 5 DAYS Performed at [facility]    Report Status 07/26/2014 FINAL  Final  Culture, blood (routine x 2)     Status: None (Preliminary result)   Collection Time: 07/24/14  3:40 PM  Result Value Ref Range Status   Specimen Description BLOOD LEFT ARM  Final   Special Requests BOTTLES DRAWN AEROBIC ONLY 3CC AEROBIC BOTTLE ONLY  Final   Culture   Final           BLOOD CULTURE RECEIVED NO GROWTH TO DATE CULTURE WILL BE HELD FOR 5 DAYS BEFORE ISSUING A FINAL NEGATIVE REPORT Performed at [facility]    Report Status PENDING  Incomplete  Culture, blood (routine x 2)  Status: None (Preliminary result)   Collection Time: 07/24/14  4:16 PM  Result Value Ref Range Status   Specimen Description BLOOD RIGHT HAND  Final   Special Requests   Final    BOTTLES DRAWN AEROBIC AND ANAEROBIC 10CC BLUE BOTTLE, 5CC RED BOTTLE   Culture   Final           BLOOD CULTURE RECEIVED NO GROWTH TO DATE CULTURE WILL BE HELD FOR 5 DAYS BEFORE ISSUING A FINAL NEGATIVE  REPORT Performed at [facility]    Report Status PENDING  Incomplete    Radiology Reports Ct Chest W Contrast  07/24/2014   CLINICAL DATA:  Portal vein thrombosis and fevers. Clinically, the patient has septic thrombophlebitis. 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 Admitted with diffuse abdominal pain and fever as high as 103. Elevated liver function tests.  EXAM: CT CHEST, ABDOMEN, AND PELVIS WITH CONTRAST  TECHNIQUE: Multidetector CT imaging of the chest, abdomen and pelvis was performed following the standard protocol during bolus administration of intravenous contrast.  CONTRAST:  OMNIPAQUE IOHEXOL 300 MG/ML  SOLN  COMPARISON:  Abdomen and pelvis CT dated 07/19/2014.  FINDINGS: CT CHEST FINDINGS  Left subclavian catheter tip at the superior cavoatrial junction. Thoracic spine fixation hardware and scoliosis. Minimal dependent atelectasis in both lower lobes. Otherwise, clear lungs. Minimal right pleural fluid. No lung nodules or enlarged lymph nodes.  CT ABDOMEN AND PELVIS FINDINGS  Surgically absent spleen. Persistent thrombus in the distal right portal vein and right hepatic lobe portal vein branch. Poorly distended gallbladder. Normal appearing pancreas, adrenal glands, kidneys, urinary bladder, uterus and ovaries. No gastrointestinal abnormalities. Mildly enlarged lymph nodes at the root of the mesentery without significant change. The largest has a short axis diameter of 8 mm on image number 58. Mild to moderate levoconvex lumbar rotary scoliosis.  IMPRESSION: 1. Persistent thrombus in the right portal vein and right hepatic lobe portal vein branch. 2. Stable mild reactive mesenteric adenopathy. 3. Minimal right pleural effusion.   Electronically Signed   By: Beckie Salts M.D.   On: 07/24/2014 19:04   Ct Abdomen Pelvis W Contrast  07/24/2014   CLINICAL DATA:  Portal vein thrombosis and fevers. Clinically, the patient has septic thrombophlebitis. Previous  splenectomy related to hypercoagulable physiology. Admitted with diffuse abdominal pain and fever as high as 103. Elevated liver function tests.  EXAM: CT CHEST, ABDOMEN, AND PELVIS WITH CONTRAST  TECHNIQUE: Multidetector CT imaging of the chest, abdomen and pelvis was performed following the standard protocol during bolus administration of intravenous contrast.  CONTRAST:  OMNIPAQUE IOHEXOL 300 MG/ML  SOLN  COMPARISON:  Abdomen and pelvis CT dated 07/19/2014.  FINDINGS: CT CHEST FINDINGS  Left subclavian catheter tip at the superior cavoatrial junction. Thoracic spine fixation hardware and scoliosis. Minimal dependent atelectasis in both lower lobes. Otherwise, clear lungs. Minimal right pleural fluid. No lung nodules or enlarged lymph nodes.  CT ABDOMEN AND PELVIS FINDINGS  Surgically absent spleen. Persistent thrombus in the distal right portal vein and right hepatic lobe portal vein branch. Poorly distended gallbladder. Normal appearing pancreas, adrenal glands, kidneys, urinary bladder, uterus and ovaries. No gastrointestinal abnormalities. Mildly enlarged lymph nodes at the root of the mesentery without significant change. The largest has a short axis diameter of 8 mm on image number 58. Mild to moderate levoconvex lumbar rotary scoliosis.  IMPRESSION: 1. Persistent thrombus in the right portal vein and right hepatic lobe portal vein branch. 2. Stable mild reactive mesenteric adenopathy. 3. Minimal  right pleural effusion.   Electronically Signed   By: Beckie Salts M.D.   On: 07/24/2014 19:04    CBC  Recent Labs Lab 07/20/14 0442 07/22/14 0501 07/24/14 1130 07/25/14 0415 07/26/14 0523  WBC 11.8* 11.9* 14.1* 16.3* 16.8*  HGB 11.3* 11.1* 11.3* 10.5* 10.4*  HCT 33.9* 34.5* 34.6* 32.1* 32.5*  PLT 264 250 286 307 335  MCV 92.6 93.0 92.8 92.8 93.9  MCH 30.9 29.9 30.3 30.3 30.1  MCHC 33.3 32.2 32.7 32.7 32.0  RDW 15.4 15.9* 16.2* 16.3* 16.7*  LYMPHSABS  --   --   --   --  10.6*  MONOABS  --    --   --   --  1.2*  EOSABS  --   --   --   --  1.0*  BASOSABS  --   --   --   --  0.3*    Chemistries   Recent Labs Lab 07/23/14 0500 07/23/14 1430 07/24/14 1130 07/25/14 0415 07/26/14 0523  NA 137 144 139 138 136  K 3.2* 3.6 4.4 3.6 3.7  CL 107 110 107 106 106  CO2 GLUCOSE 145* 125* 95 110* 114*  BUN <5* <5* <5* <5* <5*  CREATININE 0.68 0.67 0.53 0.74 0.67  CALCIUM 8.1* 8.1* 8.3* 8.0* 7.7*  AST 155* 122* 95* 61* 54*  ALT 120* 110* 94* 75* 56*  ALKPHOS 127* 134* 145* 130* 116  BILITOT 0.4 0.3 0.7 0.4 0.4   ------------------------------------------------------------------------------------------------------------------ estimated creatinine clearance is 104.6 mL/min (by C-G formula based on Cr of 0.67). ------------------------------------------------------------------------------------------------------------------ No results for input(s): HGBA1C in the last 72 hours. ------------------------------------------------------------------------------------------------------------------ No results for input(s): CHOL, HDL, LDLCALC, TRIG, CHOLHDL, LDLDIRECT in the last 72 hours. ------------------------------------------------------------------------------------------------------------------ No results for input(s): TSH, T4TOTAL, T3FREE, THYROIDAB in the last 72 hours.  Invalid input(s): FREET3 ------------------------------------------------------------------------------------------------------------------  Recent Labs  07/25/14 1625  FERRITIN 193    Coagulation profile No results for input(s): INR, PROTIME in the last 168 hours.  No results for input(s): DDIMER in the last 72 hours.  Cardiac Enzymes No results for input(s): CKMB, TROPONINI, MYOGLOBIN in the last 168 hours.  Invalid input(s): CK ------------------------------------------------------------------------------------------------------------------ Invalid input(s): POCBNP     Time Spent  in minutes  25 minutes   Avital Dancy M.D on 07/26/2014 at 1:49 PM  Between 7am to 7pm - Pager - [phone]  After 7pm go to [email or web address] - password [email or web address]  And look for the night coverage person covering for me after hours  [facility] Office  [phone]   **Disclaimer: This note may have been dictated with voice recognition software. Similar sounding words can inadvertently be transcribed and this note may contain transcription errors which may not have been corrected upon publication of note.**

## 2014-07-26 NOTE — Progress Notes (Signed)
Patient ID: Carol [name], female   DOB: 1995-05-23, 19 y.o.   MRN: [id number]         [facility]    Date of Admission:  07/19/2014   Total days of antibiotics 8        Day 2 ciprofloxacin and metronidazole         Principal Problem:   Suppurative pylephlebitis Active Problems:   Abdominal pain   Portal vein thrombosis   Sepsis   Nausea and vomiting   Elevated liver enzymes   Rash   History of splenectomy   Normocytic anemia   Obesity   Septic thrombophlebitis   DVT (deep venous thrombosis)   . antiseptic oral rinse  7 mL Mouth Rinse q12n4p  . chlorhexidine  15 mL Mouth Rinse BID  . ciprofloxacin  500 mg Oral BID  . gabapentin  100 mg Oral TID  . gabapentin  300 mg Oral QHS  . metroNIDAZOLE  500 mg Oral Q12H  . rivaroxaban  15 mg Oral BID WC   Followed by  . [START ON 08/13/2014] rivaroxaban  20 mg Oral Q supper    SUBJECTIVE: She continues to have abdominal pain and nausea. [name] not had any vomiting today but has been gagging and now has some heartburn. She is still having intermittent headaches, especially when she has fever. She continues to have chills and night sweats. She has had very soft stools lately but no watery diarrhea. She is having difficulty with the taste of metronidazole. Yesterday she also had generalized itching on her arms and noted that she had red splotches on her left arm.   OBJECTIVE: Blood pressure 100/54, pulse 88, temperature 97.4 F (36.3 C), temperature source Oral, resp. rate 20, height  (1.499 m), weight 179 lb 14.3 oz (81.6 kg), last menstrual period 12/18/2013, SpO2 95 %.   General: She is alert and talkative. Her stepfather is present. IV site looks good Skin: No rash or erythema Oral: No oropharyngeal lesions Lungs: Clear Cor: Regular S1 and S2 with no murmur Abdomen: Soft with very mild diffuse tenderness. Active bowel sounds. Extremities: Normal  Lab Results Lab Results  Component Value  Date   WBC 16.8* 07/26/2014   HGB 10.4* 07/26/2014   HCT 32.5* 07/26/2014   MCV 93.9 07/26/2014   PLT 335 07/26/2014    Lab Results  Component Value Date   CREATININE 0.67 07/26/2014   BUN <5* 07/26/2014   NA 136 07/26/2014   K 3.7 07/26/2014   CL 106 07/26/2014   CO2 24 07/26/2014    Lab Results  Component Value Date   ALT 56* 07/26/2014   AST 54* 07/26/2014   ALKPHOS 116 07/26/2014   BILITOT 0.4 07/26/2014     Microbiology: Recent Results (from the past 240 hour(s))  Culture, Urine     Status: None   Collection Time: 07/19/14  6:20 PM  Result Value Ref Range Status   Specimen Description URINE, CLEAN CATCH  Final   Special Requests Normal  Final   Colony Count NO GROWTH Performed at [facility]   Final   Culture NO GROWTH Performed at [facility]   Final   Report Status 07/21/2014 FINAL  Final  Culture, blood (routine x 2)     Status: None   Collection Time: 07/19/14  7:35 PM  Result Value Ref Range Status   Specimen Description BLOOD LEFT ARM  Final   Special Requests BAA 5CC  Final  Culture   Final    NO GROWTH 5 DAYS [facility]    Report Status 07/26/2014 FINAL  Final  Culture, blood (routine x 2)     Status: None   Collection Time: 07/19/14  7:35 PM  Result Value Ref Range Status   Specimen Description BLOOD RIGHT HAND  Final   Special Requests BOTTLES DRAWN AEROBIC ONLY 10CC  Final   Culture   Final    NO GROWTH 5 DAYS [facility]    Report Status 07/26/2014 FINAL  Final  Culture, blood (x 2)     Status: None   Collection Time: 07/20/14  4:05 PM  Result Value Ref Range Status   Specimen Description BLOOD LEFT HAND  Final   Special Requests BOTTLES DRAWN AEROBIC ONLY 6 CC BLUE  Final   Culture   Final    NO GROWTH 5 DAYS [facility]    Report Status 07/26/2014 FINAL  Final  Culture, blood (routine x 2)     Status: None (Preliminary result)   Collection Time:  07/24/14  3:40 PM  Result Value Ref Range Status   Specimen Description BLOOD LEFT ARM  Final   Special Requests BOTTLES DRAWN AEROBIC ONLY 3CC AEROBIC BOTTLE ONLY  Final   Culture   Final           BLOOD CULTURE RECEIVED NO GROWTH TO DATE CULTURE WILL BE HELD FOR 5 DAYS BEFORE ISSUING A FINAL NEGATIVE REPORT [facility]    Report Status PENDING  Incomplete  Culture, blood (routine x 2)     Status: None (Preliminary result)   Collection Time: 07/24/14  4:16 PM  Result Value Ref Range Status   Specimen Description BLOOD RIGHT HAND  Final   Special Requests   Final    BOTTLES DRAWN AEROBIC AND ANAEROBIC 10CC BLUE BOTTLE, 5CC RED BOTTLE   Culture   Final           BLOOD CULTURE RECEIVED NO GROWTH TO DATE CULTURE WILL BE HELD FOR 5 DAYS BEFORE ISSUING A FINAL NEGATIVE REPORT [facility]    Report Status PENDING  Incomplete    Assessment: Given that she still has visible clot on her repeat CT scan, persistent abdominal pain and nausea I believe that the cause of her persistent fever is still separative portal vein thrombophlebitis. It is possible that she has some superimposed component of drug fever but that should resolve after switching to ciprofloxacin and metronidazole yesterday. If she starts to have diarrhea we'll also check a C. difficile PCR. She is requesting to have her oral antibiotics switched back to IV because of her nausea.  Plan: 1. Change ciprofloxacin and metronidazole to IV form 2. Check WBC differential to see if she has any eosinophilia  Cliffton AstersJohn Michaeal Davis, MD [facility] [phone] pager   [phone] cell 07/26/2014, 12:20 PM

## 2014-07-26 NOTE — Progress Notes (Signed)
  Echocardiogram 2D Echocardiogram has been performed.  Carol [name] FRANCES 07/26/2014, 3:43 PM

## 2014-07-27 ENCOUNTER — Inpatient Hospital Stay (HOSPITAL_COMMUNITY): Payer: Medicaid Other

## 2014-07-27 LAB — CBC WITH DIFFERENTIAL/PLATELET
BASOS ABS: 0.4 10*3/uL — AB (ref 0.0–0.1)
BASOS PCT: 2 % — AB (ref 0–1)
EOS ABS: 1 10*3/uL — AB (ref 0.0–0.7)
Eosinophils Relative: 5 % (ref 0–5)
HEMATOCRIT: 33 % — AB (ref 36.0–46.0)
Hemoglobin: 10.5 g/dL — ABNORMAL LOW (ref 12.0–15.0)
LYMPHS ABS: 13.5 10*3/uL — AB (ref 0.7–4.0)
LYMPHS PCT: 65 % — AB (ref 12–46)
MCH: 29.7 pg (ref 26.0–34.0)
MCHC: 31.8 g/dL (ref 30.0–36.0)
MCV: 93.2 fL (ref 78.0–100.0)
Monocytes Absolute: 1.2 10*3/uL — ABNORMAL HIGH (ref 0.1–1.0)
Monocytes Relative: 6 % (ref 3–12)
NEUTROS ABS: 4.6 10*3/uL (ref 1.7–7.7)
Neutrophils Relative %: 22 % — ABNORMAL LOW (ref 43–77)
Platelets: 341 10*3/uL (ref 150–400)
RBC: 3.54 MIL/uL — ABNORMAL LOW (ref 3.87–5.11)
RDW: 16.3 % — ABNORMAL HIGH (ref 11.5–15.5)
WBC: 20.7 10*3/uL — ABNORMAL HIGH (ref 4.0–10.5)

## 2014-07-27 LAB — COMPREHENSIVE METABOLIC PANEL
ALT: 48 U/L — AB (ref 0–35)
AST: 53 U/L — ABNORMAL HIGH (ref 0–37)
Albumin: 2.4 g/dL — ABNORMAL LOW (ref 3.5–5.2)
Alkaline Phosphatase: 116 U/L (ref 39–117)
Anion gap: 8 (ref 5–15)
BUN: 5 mg/dL — ABNORMAL LOW (ref 6–23)
CO2: 25 mmol/L (ref 19–32)
Calcium: 8.3 mg/dL — ABNORMAL LOW (ref 8.4–10.5)
Chloride: 106 mmol/L (ref 96–112)
Creatinine, Ser: 0.66 mg/dL (ref 0.50–1.10)
GFR calc Af Amer: >90 mL/min (ref >90)
GFR calc non Af Amer: >90 mL/min (ref >90)
GLUCOSE: 110 mg/dL — AB (ref 70–99)
POTASSIUM: 4 mmol/L (ref 3.5–5.1)
Sodium: 139 mmol/L (ref 135–145)
Total Bilirubin: 0.3 mg/dL (ref 0.3–1.2)
Total Protein: 5.9 g/dL — ABNORMAL LOW (ref 6.0–8.3)

## 2014-07-27 LAB — PROTEIN ELECTROPHORESIS, SERUM
A/G Ratio: 0.7 (ref 0.7–2.0)
Albumin ELP: 2.1 g/dL — ABNORMAL LOW (ref 3.2–5.6)
Alpha-1-Globulin: 0.3 g/dL (ref 0.1–0.4)
Alpha-2-Globulin: 0.8 g/dL (ref 0.4–1.2)
BETA GLOBULIN: 0.9 g/dL (ref 0.6–1.3)
Gamma Globulin: 0.8 g/dL (ref 0.5–1.6)
Globulin, Total: 2.9 g/dL (ref 2.0–4.5)
Total Protein ELP: 5 g/dL — ABNORMAL LOW (ref 6.0–8.5)

## 2014-07-27 LAB — PATHOLOGIST SMEAR REVIEW

## 2014-07-27 LAB — CBC
HCT: 32.3 % — ABNORMAL LOW (ref 36.0–46.0)
Hemoglobin: 10.5 g/dL — ABNORMAL LOW (ref 12.0–15.0)
MCH: 30.3 pg (ref 26.0–34.0)
MCHC: 32.5 g/dL (ref 30.0–36.0)
MCV: 93.4 fL (ref 78.0–100.0)
PLATELETS: 357 10*3/uL (ref 150–400)
RBC: 3.46 MIL/uL — AB (ref 3.87–5.11)
RDW: 16.6 % — ABNORMAL HIGH (ref 11.5–15.5)
WBC: 19.6 10*3/uL — ABNORMAL HIGH (ref 4.0–10.5)

## 2014-07-27 MED ORDER — DIPHENHYDRAMINE HCL 50 MG/ML IJ SOLN
25.0000 mg | Freq: Once | INTRAMUSCULAR | Status: AC
  Administered 2014-07-27: 25 mg via INTRAVENOUS
  Filled 2014-07-27: qty 1

## 2014-07-27 NOTE — Progress Notes (Signed)
Patient ID: Carol [name], female   DOB: 1995/10/10, 19 y.o.   MRN: [id number]         [facility]    Date of Admission:  07/19/2014   Total days of antibiotics 9        Day 3 ciprofloxacin and metronidazole          Principal Problem:   Suppurative pylephlebitis Active Problems:   Abdominal pain   Portal vein thrombosis   Sepsis   Nausea and vomiting   Elevated liver enzymes   Rash   History of splenectomy   Normocytic anemia   Obesity   Septic thrombophlebitis   DVT (deep venous thrombosis)   . antiseptic oral rinse  7 mL Mouth Rinse q12n4p  . chlorhexidine  15 mL Mouth Rinse BID  . ciprofloxacin  400 mg Intravenous Q12H  . gabapentin  100 mg Oral TID  . gabapentin  300 mg Oral QHS  . metronidazole  500 mg Intravenous Q8H  . rivaroxaban  15 mg Oral BID WC   Followed by  . [START ON 08/13/2014] rivaroxaban  20 mg Oral Q supper    Subjective: She states that there is no difference in her abdominal pain or nausea. She has not had any vomiting but states that she just state lunch and feels like she might vomit. She is very hot. Her pruritus has resolved.   Review of Systems: Pertinent items are noted in HPI.  History reviewed. No pertinent past medical history.  History  Substance Use Topics  . Smoking status: Never Smoker   . Smokeless tobacco: Not on file  . Alcohol Use: No    No family history on file. Allergies  Allergen Reactions  . Tape Hives    Can use paper tape    OBJECTIVE: Blood pressure 114/61, pulse 115, temperature 98.8 F (37.1 C), temperature source Oral, resp. rate 17, height  (1.499 m), weight 179 lb 14.3 oz (81.6 kg), last menstrual period 12/18/2013, SpO2 96 %.   General: She is alert and talkative. She is hot and slightly diaphoretic when I first enter the room and kicks off all of her covers  Skin: When I first came in the room she had a diffuse red maculopapular rash which faded significantly over  the 10 minutes that I was in the room  Lungs: Clear  Cor: Regular S1 and S2 no murmurs  Abdomen: Soft with mild upper quadrant tenderness worse on the right than on the left Extremities: No acute abnormalities   Lab Results Lab Results  Component Value Date   WBC 19.6* 07/27/2014   HGB 10.5* 07/27/2014   HCT 32.3* 07/27/2014   MCV 93.4 07/27/2014   PLT 357 07/27/2014    Lab Results  Component Value Date   CREATININE 0.66 07/27/2014   BUN 5* 07/27/2014   NA 139 07/27/2014   K 4.0 07/27/2014   CL 106 07/27/2014   CO2 25 07/27/2014    Lab Results  Component Value Date   ALT 48* 07/27/2014   AST 53* 07/27/2014   ALKPHOS 116 07/27/2014   BILITOT 0.3 07/27/2014   Cytomegalovirus IgM and IgG: Both are positive Epstein-Barr virus viral capsid antigen IgM and IgG and nuclear antigen IgG: All positive Epstein-Barr virus early antigen: Negative White blood cell count differential: Marked elevation of lymphocytes and monocytes with atypical lymphocytes and mild eosinophilia  Microbiology: Recent Results (from the past 240 hour(s))  Culture, Urine     Status:  None   Collection Time: 07/19/14  6:20 PM  Result Value Ref Range Status   Specimen Description URINE, CLEAN CATCH  Final   Special Requests Normal  Final   Colony Count NO GROWTH Performed at [facility]   Final   Culture NO GROWTH Performed at [facility]   Final   Report Status 07/21/2014 FINAL  Final  Culture, blood (routine x 2)     Status: None   Collection Time: 07/19/14  7:35 PM  Result Value Ref Range Status   Specimen Description BLOOD LEFT ARM  Final   Special Requests BAA 5CC  Final   Culture   Final    NO GROWTH 5 DAYS Performed at [facility]    Report Status 07/26/2014 FINAL  Final  Culture, blood (routine x 2)     Status: None   Collection Time: 07/19/14  7:35 PM  Result Value Ref Range Status   Specimen Description BLOOD RIGHT HAND  Final   Special Requests BOTTLES  DRAWN AEROBIC ONLY 10CC  Final   Culture   Final    NO GROWTH 5 DAYS Performed at [facility]    Report Status 07/26/2014 FINAL  Final  Culture, blood (x 2)     Status: None   Collection Time: 07/20/14  4:05 PM  Result Value Ref Range Status   Specimen Description BLOOD LEFT HAND  Final   Special Requests BOTTLES DRAWN AEROBIC ONLY 6 CC BLUE  Final   Culture   Final    NO GROWTH 5 DAYS Performed at [facility]    Report Status 07/26/2014 FINAL  Final  Culture, blood (routine x 2)     Status: None (Preliminary result)   Collection Time: 07/24/14  3:40 PM  Result Value Ref Range Status   Specimen Description BLOOD LEFT ARM  Final   Special Requests BOTTLES DRAWN AEROBIC ONLY 3CC AEROBIC BOTTLE ONLY  Final   Culture   Final           BLOOD CULTURE RECEIVED NO GROWTH TO DATE CULTURE WILL BE HELD FOR 5 DAYS BEFORE ISSUING A FINAL NEGATIVE REPORT Performed at [facility]    Report Status PENDING  Incomplete  Culture, blood (routine x 2)     Status: None (Preliminary result)   Collection Time: 07/24/14  4:16 PM  Result Value Ref Range Status   Specimen Description BLOOD RIGHT HAND  Final   Special Requests   Final    BOTTLES DRAWN AEROBIC AND ANAEROBIC 10CC BLUE BOTTLE, 5CC RED BOTTLE   Culture   Final           BLOOD CULTURE RECEIVED NO GROWTH TO DATE CULTURE WILL BE HELD FOR 5 DAYS BEFORE ISSUING A FINAL NEGATIVE REPORT Performed at [facility]    Report Status PENDING  Incomplete  Clostridium Difficile by PCR     Status: None   Collection Time: 07/26/14  8:49 AM  Result Value Ref Range Status   C difficile by pcr NEGATIVE NEGATIVE Final    Assessment: Carol [name] febrile illness keeps getting more and more complicated. Her illness still fits the definition of septic portal vein thrombophlebitis. I cannot rule out the possibility of some bacterial infection from some unrecognized intra-abdominal source given that she received antibiotics  prior to her admission blood cultures. I feel we need to keep her on empiric antibacterial therapy. She has some waxing and waning rash and mild eosinophilia. Some of  her recent fever could have been due to a drug allergy. Today's rash faded rapidly after she removed the covers and I think that it was probably primarily a heat rash. It does not itch so I will not change any of her current medications.  Her CMV and EBV serologies are abnormal and suggests the possibility of reactivation of infection and acute mononucleosis syndrome. The elevated liver enzymes and increasing lymphocytosis with atypical lymphocytes would go along with this. Interestingly, CMV has become associated with acute thromboembolic disorders including portal vein thrombosis. CMV is also a well-known cause protracted fevers and otherwise healthy adults. There is no evidence that specific antiviral therapy alters the clinical course of therapy or outcome.  Plan: Continue ciprofloxacin and metronidazole along with anticoagulation for now  Monitor fever curve and overall clinical status  I discussed this recommendation with Carol [name] and her mother and both are in agreement   Cliffton AstersJohn Sandee Bernath, MD [facility] [phone] pager   [phone] cell 07/27/2014, 3:49 PM

## 2014-07-27 NOTE — Progress Notes (Signed)
ADDENDUM: Most of the hypercoagulable panel results are back, and the patient does not carry a LAC or hereditary mutation that we can detect. The prothrombin gene mutation results are pending.  Will follow with you as per our APP's note earlier today.  Please let me know if we can be of further help/

## 2014-07-27 NOTE — Progress Notes (Signed)
Patient Demographics  Carol [name], is a 19 y.o. female, DOB - Feb 26, 1996, MWU:[id number]  Admit date - 07/19/2014   Admitting Physician Calvert Cantor, MD  Outpatient Primary MD for the patient is [name], MD  LOS - 8   Chief Complaint  Patient presents with  . 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 Abdominal pain was cramp like, 6/10 in intensity, associated with nausea and vomiting. She has seen PCP 07/14/2014 and was given compazine for nausea and vomiting and ibuprofen for pain but with little symptomatic relief. On admission, patient was hemodynamically stable. She was found to have elevated LFT's which prompted abd [place] study which subsequently revealed portal vein thrombosis. CT abd confirmed this finding as well. She was started on heparin and hematology has seen the patient in consultation. Patient continues to have significant high fever, infectious disease were consulted, patient was thought to have septic portal vein thrombophlebitis, initially she was on IV vancomycin and cefepime given her history of splenectomy, antibiotic were transitioned to IV Unasyn to cover for anaerobes per ID recommendation , then transitioned to IV Cipro and Flagyl.  Subjective:   Carol [name] today has, No headache, No chest pain,  No Nausea, No new weakness tingling or numbness, No Cough - SOB, still complaining of abdominal pain, still having fever. T max of 101.3  Assessment & Plan    Principal Problem:   Suppurative pylephlebitis Active Problems:   Abdominal pain   Portal vein thrombosis   Sepsis   Nausea and  vomiting   History of splenectomy   Normocytic anemia   Obesity   Elevated liver enzymes   Rash   Septic thrombophlebitis   DVT (deep venous thrombosis)  Sepsis/septic portal vein thrombophlebitis - Infectious disease consult greatly appreciated, patient meets clinical and radiographic criteria for septic portal vein thrombophlebitis given her fever, abdominal pain, nausea, vomiting, acute portal vein clot and elevated liver enzyme. - Follow on blood cultures, would be low yield given it was done after IV antibiotics were started, no growth to date. - Antibiotics were changed to IV Unasyn 4/13 to provide additional anaerobic coverage, then transitioned to Cipro and Flagyl on 4/17 for possible superimposed component of drug fever. inserted PICC line in anticipation of IV antibiotics for the first few weeks. - Repeat CT abdomen and pelvis showing persistent thrombus in the right portal vein and right hepatic lobe portal vein branch, (which would explain her persistent fever) and mild reactive mesenteric adenopathy ,no evidence of any liver abscess. - CMV IgM and IgG both are positive, EBV IgM, IgG and nuclear antigen is positive,  white blood cell differential showing significant elevation of lymphocytes, findings suggests the possibility of reactivation of infection and acute mononucleosis syndrome. - UTI specimen is contaminated,  no growth on urine culture. - Follow-up appointments with hematology on scheduled appointment as an outpatient.  Elevated liver enzymes - This is in the setting of septic portal vein thrombophlebitis, versus reactivation of infection  mononucleosis syndrome   Portal vein thrombosis -Patient started initially on heparin drip, transitioned to Xarelto.  Complains of left inner thigh pain with mild erythema. -venous Doppler negative for DVT      Code Status: Full code  Family Communication:   Mother at bedside  Disposition Plan: Remains inpatient, still  having fever    Procedures  None   Consults   Infectious disease  Hematology   Medications  Scheduled Meds: . antiseptic oral rinse  7 mL Mouth Rinse q12n4p  . chlorhexidine  15 mL Mouth Rinse BID  . ciprofloxacin  400 mg Intravenous Q12H  . gabapentin  100 mg Oral TID  . gabapentin  300 mg Oral QHS  . metronidazole  500 mg Intravenous Q8H  . rivaroxaban  15 mg Oral BID WC   Followed by  . [START ON 08/13/2014] rivaroxaban  20 mg Oral Q supper   Continuous Infusions: . 0.9 % NaCl with KCl 20 mEq / L 50 mL/hr (07/26/14 1321)   PRN Meds:.acetaminophen, alum & mag hydroxide-simeth, diphenhydrAMINE, ibuprofen, morphine injection, ondansetron (ZOFRAN) IV, oxyCODONE, sodium chloride  DVT Prophylaxis  and heparin drip  Lab Results  Component Value Date   PLT 357 07/27/2014    Antibiotics   Anti-infectives    Start     Dose/Rate Route Frequency Ordered Stop   07/26/14 1300  ciprofloxacin (CIPRO) IVPB 400 mg     400 mg 200 mL/hr over 60 Minutes Intravenous Every 12 hours 07/26/14 1226     07/26/14 1300  metroNIDAZOLE (FLAGYL) IVPB 500 mg     500 mg 100 mL/hr over 60 Minutes Intravenous Every 8 hours 07/26/14 1226     07/25/14 2000  ciprofloxacin (CIPRO) tablet 500 mg  Status:  Discontinued     500 mg Oral 2 times daily 07/25/14 1530 07/26/14 1226   07/25/14 2000  metroNIDAZOLE (FLAGYL) tablet 500 mg  Status:  Discontinued     500 mg Oral Every 12 hours 07/25/14 1530 07/26/14 1226   07/21/14 1200  Ampicillin-Sulbactam (UNASYN) 3 g in sodium chloride 0.9 % 100 mL IVPB  Status:  Discontinued     3 g 100 mL/hr over 60 Minutes Intravenous Every 6 hours 07/21/14 1045 07/25/14 1530   07/20/14 1600  cefTRIAXone (ROCEPHIN) 1 g in dextrose 5 % 50 mL IVPB - Premix  Status:  Discontinued     1 g 100 mL/hr over 30 Minutes Intravenous Every 24 hours 07/19/14 1819 07/20/14 1444   07/20/14 1600  vancomycin (VANCOCIN) IVPB 1000 mg/200 mL premix  Status:  Discontinued     1,000 mg 200  mL/hr over 60 Minutes Intravenous Every 8 hours 07/20/14 1513 07/21/14 1044   07/20/14 1600  ceFEPIme (MAXIPIME) 1 g in dextrose 5 % 50 mL IVPB  Status:  Discontinued     1 g 100 mL/hr over 30 Minutes Intravenous Every 8 hours 07/20/14 1513 07/21/14 1044   07/19/14 1515  cefTRIAXone (ROCEPHIN) 1 g in dextrose 5 % 50 mL IVPB     1 g 100 mL/hr over 30 Minutes Intravenous  Once 07/19/14 1507 07/19/14 1545   07/19/14 1509  cefTRIAXone (ROCEPHIN) 1 G injection    Comments:  Lauro Regulus   : cabinet override      07/19/14 1509 07/19/14 1516          Objective:   Filed Vitals:   07/27/14 0642 07/27/14 1340 07/27/14 1509 07/27/14 1546  BP:    114/61  Pulse:    115  Temp: 100.7 F (38.2 C) 101.3 F (38.5 C) 101.3 F (38.5 C) 98.8 F (37.1 C)  TempSrc: Oral Oral  Oral  Resp:    17  Height:      Weight:      SpO2:    96%    Wt Readings from Last 3 Encounters:  07/19/14 81.6 kg (179 lb 14.3 oz) (95 %*, Z = 1.60)  06/12/14 77.111 kg (170 lb) (92 %*, Z = 1.40)  04/21/13 55.792 kg (123 lb) (46 %*, Z = -0.09)   * Growth percentiles are based on CDC 2-20 Years data.     Intake/Output Summary (Last 24 hours) at 07/27/14 1708 Last data filed at 07/27/14 1547  Gross per 24 hour  Intake   1560 ml  Output      0 ml  Net   1560 ml     Physical Exam  comfortable , No new F.N deficits, Normal affect [place].AT,PERRAL Supple Neck,No JVD, No cervical lymphadenopathy appriciated.  Symmetrical Chest wall movement, Good air movement bilaterally, CTAB RRR,No Gallops,Rubs or new Murmurs, No Parasternal Heave +ve B.Sounds, Abd Soft, mild tenderness to palpation , No organomegaly appriciated, No rebound - guarding or rigidity. No Cyanosis, Clubbing or edema, No new Rash or bruise    Data Review   Micro Results Recent Results (from the past 240 hour(s))  Culture, Urine     Status: None   Collection Time: 07/19/14  6:20 PM  Result Value Ref Range Status   Specimen Description URINE, CLEAN  CATCH  Final   Special Requests Normal  Final   Colony Count NO GROWTH Performed at [facility]   Final   Culture NO GROWTH Performed at [facility]   Final   Report Status 07/21/2014 FINAL  Final  Culture, blood (routine x 2)     Status: None   Collection Time: 07/19/14  7:35 PM  Result Value Ref Range Status   Specimen Description BLOOD LEFT ARM  Final   Special Requests BAA 5CC  Final   Culture   Final    NO GROWTH 5 DAYS Performed at [facility]    Report Status 07/26/2014 FINAL  Final  Culture, blood (routine x 2)     Status: None   Collection Time: 07/19/14  7:35 PM  Result Value Ref Range Status   Specimen Description BLOOD RIGHT HAND  Final   Special Requests BOTTLES DRAWN AEROBIC ONLY 10CC  Final   Culture   Final    NO GROWTH 5 DAYS Performed at [facility]    Report Status 07/26/2014 FINAL  Final  Culture, blood (x 2)     Status: None   Collection Time: 07/20/14  4:05 PM  Result Value Ref Range Status   Specimen Description BLOOD LEFT HAND  Final   Special Requests BOTTLES DRAWN AEROBIC ONLY 6 CC BLUE  Final   Culture   Final    NO GROWTH 5 DAYS Performed at [facility]    Report Status 07/26/2014 FINAL  Final  Culture, blood (routine x 2)     Status: None (Preliminary result)   Collection Time: 07/24/14  3:40 PM  Result Value Ref Range Status   Specimen Description BLOOD LEFT ARM  Final   Special Requests BOTTLES DRAWN AEROBIC ONLY 3CC AEROBIC BOTTLE ONLY  Final   Culture   Final           BLOOD CULTURE RECEIVED NO GROWTH TO DATE CULTURE WILL  BE HELD FOR 5 DAYS BEFORE ISSUING A FINAL NEGATIVE REPORT Performed at [facility]    Report Status PENDING  Incomplete  Culture, blood (routine x 2)     Status: None (Preliminary result)   Collection Time: 07/24/14  4:16 PM  Result Value Ref Range Status   Specimen Description BLOOD RIGHT HAND  Final   Special Requests   Final    BOTTLES DRAWN AEROBIC AND  ANAEROBIC 10CC BLUE BOTTLE, 5CC RED BOTTLE   Culture   Final           BLOOD CULTURE RECEIVED NO GROWTH TO DATE CULTURE WILL BE HELD FOR 5 DAYS BEFORE ISSUING A FINAL NEGATIVE REPORT Performed at [facility]    Report Status PENDING  Incomplete  Clostridium Difficile by PCR     Status: None   Collection Time: 07/26/14  8:49 AM  Result Value Ref Range Status   C difficile by pcr NEGATIVE NEGATIVE Final    Radiology Reports Dg Chest 2 View  07/27/2014   CLINICAL DATA:  Nonproductive cough for the past 2 days. Some shortness of breath and mid chest pain.  EXAM: CHEST  2 VIEW  COMPARISON:  07/20/2014 and chest CT dated 07/24/2014.  FINDINGS: Normal sized heart. Clear lungs. Left PICC tip at the superior cavoatrial junction. Thoracic spine fixation hardware and stable scoliosis.  IMPRESSION: No acute abnormality.   Electronically Signed   By: Beckie Salts M.D.   On: 07/27/2014 14:37    CBC  Recent Labs Lab 07/22/14 0501 07/24/14 1130 07/25/14 0415 07/26/14 0523 07/27/14 0408  WBC 11.9* 14.1* 16.3* 16.8* 19.6*  HGB 11.1* 11.3* 10.5* 10.4* 10.5*  HCT 34.5* 34.6* 32.1* 32.5* 32.3*  PLT 250 286 307 335 357  MCV 93.0 92.8 92.8 93.9 93.4  MCH 29.9 30.3 30.3 30.1 30.3  MCHC 32.2 32.7 32.7 32.0 32.5  RDW 15.9* 16.2* 16.3* 16.7* 16.6*  LYMPHSABS  --   --   --  10.6*  --   MONOABS  --   --   --  1.2*  --   EOSABS  --   --   --  1.0*  --   BASOSABS  --   --   --  0.3*  --     Chemistries   Recent Labs Lab 07/23/14 1430 07/24/14 1130 07/25/14 0415 07/26/14 0523 07/27/14 0408  NA 144 139 138 136 139  K 3.6 4.4 3.6 3.7 4.0  CL 110 107 106 106 106  CO2 GLUCOSE 125* 95 110* 114* 110*  BUN <5* <5* <5* <5* 5*  CREATININE 0.67 0.53 0.74 0.67 0.66  CALCIUM 8.1* 8.3* 8.0* 7.7* 8.3*  AST 122* 95* 61* 54* 53*  ALT 110* 94* 75* 56* 48*  ALKPHOS 134* 145* 130* 116 116  BILITOT 0.3 0.7 0.4 0.4 0.3    ------------------------------------------------------------------------------------------------------------------ estimated creatinine clearance is 104.6 mL/min (by C-G formula based on Cr of 0.66). ------------------------------------------------------------------------------------------------------------------ No results for input(s): HGBA1C in the last 72 hours. ------------------------------------------------------------------------------------------------------------------ No results for input(s): CHOL, HDL, LDLCALC, TRIG, CHOLHDL, LDLDIRECT in the last 72 hours. ------------------------------------------------------------------------------------------------------------------ No results for input(s): TSH, T4TOTAL, T3FREE, THYROIDAB in the last 72 hours.  Invalid input(s): FREET3 ------------------------------------------------------------------------------------------------------------------  Recent Labs  07/25/14 1625  FERRITIN 193    Coagulation profile No results for input(s): INR, PROTIME in the last 168 hours.  No results for input(s): DDIMER in the last 72 hours.  Cardiac Enzymes No results for input(s): CKMB,  TROPONINI, MYOGLOBIN in the last 168 hours.  Invalid input(s): CK ------------------------------------------------------------------------------------------------------------------ Invalid input(s): POCBNP     Time Spent in minutes  25 minutes   Clemmie Marxen M.D on 07/27/2014 at 5:08 PM  Between 7am to 7pm - Pager - [phone]  After 7pm go to [email or web address] - password [email or web address]  And look for the night coverage person covering for me after hours  [facility] Office  [phone]   **Disclaimer: This note may have been dictated with voice recognition software. Similar sounding words can inadvertently be transcribed and this note may contain transcription errors which may not have been corrected upon publication of note.**

## 2014-07-27 NOTE — Progress Notes (Signed)
  Cordelia Pochebigail VanMeter   DOB:1996/04/09   WU#:[id number]   GNF#:[id number]#:[id number]  Subjective: Still Febrile, T max100.7 No major complaints except for abdominal pain with some loose stools, semiformed. Denies respiratory or cardiac complaints. Denies headaches or vision changes. Denies bleeding issues; ambulating halls   Objective:  Filed Vitals:   07/27/14 0642  BP:   Pulse:   Temp: 100.7 F (38.2 C)  Resp:     Body mass index is 36.32 kg/(m^2).  Intake/Output Summary (Last 24 hours) at 07/27/14 1023 Last data filed at 07/27/14 0427  Gross per 24 hour  Intake   1620 ml  Output      0 ml  Net   1620 ml   General: alert, in no acute distress Lungs: clear to auscultation Cardio: RRR no murmurs Abdomen, soft, mildly tender to palpation, active bowel sounds Skin, without rashes or new bruises Extremities: no edema Neuro: non focal  CBG (last 3)  No results for input(s): GLUCAP in the last 72 hours.   Labs:  Lab Results  Component Value Date   WBC 19.6* 07/27/2014   HGB 10.5* 07/27/2014   HCT 32.3* 07/27/2014   MCV 93.4 07/27/2014   PLT 357 07/27/2014   NEUTROABS 3.7 07/26/2014    Basic Metabolic Panel:  Recent Labs Lab 07/23/14 1430 07/24/14 1130 07/25/14 0415 07/26/14 0523 07/27/14 0408  NA 144 139 138 136 139  K 3.6 4.4 3.6 3.7 4.0  CL 110 107 106 106 106  CO2 24 22 26 24 25   GLUCOSE 125* 95 110* 114* 110*  BUN <5* <5* <5* <5* 5*  CREATININE 0.67 0.53 0.74 0.67 0.66  CALCIUM 8.1* 8.3* 8.0* 7.7* 8.3*   GFR Estimated Creatinine Clearance: 104.6 mL/min (by C-G formula based on Cr of 0.66). Liver Function Tests:  Recent Labs Lab 07/23/14 1430 07/24/14 1130 07/25/14 0415 07/26/14 0523 07/27/14 0408  AST 122* 95* 61* 54* 53*  ALT 110* 94* 75* 56* 48*  ALKPHOS 134* 145* 130* 116 116  BILITOT 0.3 0.7 0.4 0.4 0.3  PROT 5.9* 6.1 5.9* 5.7* 5.9*  ALBUMIN 2.5* 2.6* 2.5* 2.4* 2.4*   CBC:  Recent Labs Lab 07/22/14 0501 07/24/14 1130 07/25/14 0415 07/26/14 0523  07/27/14 0408  WBC 11.9* 14.1* 16.3* 16.8* 19.6*  NEUTROABS  --   --   --  3.7  --   HGB 11.1* 11.3* 10.5* 10.4* 10.5*  HCT 34.5* 34.6* 32.1* 32.5* 32.3*  MCV 93.0 92.8 92.8 93.9 93.4  PLT 250 286 307 335 357   Cardiac Enzymes:  Recent Labs Lab 07/25/14 0415  CKTOTAL 28   up  Recent Labs  07/25/14 1625  FERRITIN 193   Microbiology Cultures negative  Studies:  No results found.  Assessment/ Plan  Right portal vein thrombus, infected ID involved in her treatment with IV antibiotics. She is being anticoagulated with Xarelto She is to follow up as outpatient once discharged, with Dr. Darnelle CatalanMagrinat [facility]  Other medical issues as per admitting team and multispecialties.  Marcos EkeWERTMAN,Zade Falkner E, PA-C 07/27/2014  10:23 AM

## 2014-07-28 DIAGNOSIS — B259 Cytomegaloviral disease, unspecified: Secondary | ICD-10-CM | POA: Insufficient documentation

## 2014-07-28 LAB — COMPREHENSIVE METABOLIC PANEL
ALT: 40 U/L — ABNORMAL HIGH (ref 0–35)
AST: 54 U/L — ABNORMAL HIGH (ref 0–37)
Albumin: 2.5 g/dL — ABNORMAL LOW (ref 3.5–5.2)
Alkaline Phosphatase: 110 U/L (ref 39–117)
Anion gap: 8 (ref 5–15)
BUN: <5 mg/dL — ABNORMAL LOW (ref 6–23)
CO2: 23 mmol/L (ref 19–32)
CREATININE: 0.76 mg/dL (ref 0.50–1.10)
Calcium: 8.1 mg/dL — ABNORMAL LOW (ref 8.4–10.5)
Chloride: 105 mmol/L (ref 96–112)
GLUCOSE: 110 mg/dL — AB (ref 70–99)
Potassium: 4 mmol/L (ref 3.5–5.1)
Sodium: 136 mmol/L (ref 135–145)
Total Bilirubin: 0.6 mg/dL (ref 0.3–1.2)
Total Protein: 5.8 g/dL — ABNORMAL LOW (ref 6.0–8.3)

## 2014-07-28 LAB — QUANTIFERON IN TUBE
QFT TB AG MINUS NIL VALUE: 0.15 [IU]/mL
QUANTIFERON MITOGEN VALUE: 8.31 [IU]/mL
QUANTIFERON NIL VALUE: 1.4 [IU]/mL
QUANTIFERON TB AG VALUE: 1.55 IU/mL
QUANTIFERON TB GOLD: NEGATIVE

## 2014-07-28 LAB — QUANTIFERON TB GOLD ASSAY (BLOOD)

## 2014-07-28 LAB — CARDIOLIPIN ANTIBODIES, IGG, IGM, IGA
ANTICARDIOLIPIN IGA: 19 U/mL — AB (ref 0–11)
Anticardiolipin IgG: 10 GPL U/mL (ref 0–14)
Anticardiolipin IgM: <9 MPL U/mL (ref 0–12)

## 2014-07-28 LAB — PROTHROMBIN GENE MUTATION

## 2014-07-28 MED ORDER — DIPHENHYDRAMINE HCL 50 MG/ML IJ SOLN
12.5000 mg | Freq: Once | INTRAMUSCULAR | Status: AC
  Administered 2014-07-28: 12.5 mg via INTRAVENOUS
  Filled 2014-07-28: qty 1

## 2014-07-28 MED ORDER — HYDROXYZINE HCL 25 MG PO TABS
25.0000 mg | ORAL_TABLET | Freq: Three times a day (TID) | ORAL | Status: DC | PRN
  Administered 2014-07-28 – 2014-07-29 (×3): 25 mg via ORAL
  Filled 2014-07-28 (×3): qty 1

## 2014-07-28 MED ORDER — VALGANCICLOVIR HCL 450 MG PO TABS
900.0000 mg | ORAL_TABLET | Freq: Two times a day (BID) | ORAL | Status: DC
  Administered 2014-07-28 – 2014-08-02 (×11): 900 mg via ORAL
  Filled 2014-07-28 (×13): qty 2

## 2014-07-28 NOTE — Progress Notes (Signed)
Patient Demographics  Carol [name], is a 19 y.o. female, DOB - 03/07/1996, ZOX:[id number]  Admit date - 07/19/2014   Admitting Physician Calvert CantorSaima Rizwan, MD  Outpatient Primary MD for the patient is Karie ChimeraEESE,BETTI D, MD  LOS - 9   Chief Complaint  Patient presents with  . 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 On admission, patient was hemodynamically stable. She was found to have elevated LFT's which prompted abd US study which subsequently revealed portal vein thrombosis. CT abd confirmed this finding as well. She was started on heparin and hematology has seen the patient in consultation. Patient continues to have significant high fever, infectious disease were consulted, patient was thought to have septic portal vein thrombophlebitis, initially she was on IV vancomycin and cefepime given her history of splenectomy, antibiotic were transitioned to IV Unasyn to cover for anaerobes per ID recommendation , then transitioned to IV Cipro and Flagyl.  Subjective:   Patient had a rough night last night. She had episode of vomiting. She spiked fever up to 103F. Her mother is at the bedside. The mother and the patient frustrated. Has a diffuse skin rash ongoing for the last 48 hours or more. Denies any nausea, vomiting currently.  Assessment & Plan    Principal Problem:   Suppurative pylephlebitis Active Problems:   Abdominal pain   Portal vein thrombosis   Sepsis   Nausea and vomiting   History of splenectomy   Normocytic anemia   Obesity   Elevated liver enzymes   Rash   Septic thrombophlebitis   DVT (deep venous  thrombosis)   Sepsis/septic portal vein thrombophlebitis/persistent fever/diffuse skin rash - Closely followed by infectious diseases.  - Follow on blood cultures, no growth to date. - Antibiotics were changed to IV Unasyn 4/13 to provide additional anaerobic coverage, then transitioned to Cipro and Flagyl on 4/17 for possible superimposed component of drug fever. Inserted PICC line in anticipation of IV antibiotics for the first few weeks. ID to consider antiviral treatment today in view of CMV and EBV titers. - Repeat CT abdomen and pelvis showing persistent thrombus in the right portal vein and right hepatic lobe portal vein branch, (which would explain her persistent fever) and mild reactive mesenteric adenopathy, no evidence of any liver abscess. - CMV IgM and IgG both are positive, EBV IgM, IgG and nuclear antigen is positive,  white blood cell differential showing significant elevation of lymphocytes, findings suggests the possibility of reactivation of infection and acute mononucleosis syndrome. - UTI specimen is contaminated,  no growth on urine culture. - Follow-up appointments with hematology on scheduled appointment as an outpatient.  Elevated liver enzymes - This is in the setting of septic portal vein thrombophlebitis, versus reactivation of infection  mononucleosis syndrome. Counts have improved.  Portal vein thrombosis -Patient started initially on heparin drip, transitioned to Xarelto.  Complains of left inner thigh pain with mild erythema. -venous Doppler negative for DVT   DVT prophylaxis: Full anticoagulation Code Status: Full  code Family Communication:   Mother at bedside Disposition Plan: Not ready for discharge.   Consults   Infectious disease  Hematology   Medications  Scheduled Meds: . antiseptic oral rinse  7 mL Mouth Rinse q12n4p  . chlorhexidine  15 mL Mouth Rinse BID  . ciprofloxacin  400 mg Intravenous Q12H  . gabapentin  100 mg Oral TID  .  gabapentin  300 mg Oral QHS  . metronidazole  500 mg Intravenous Q8H  . rivaroxaban  15 mg Oral BID WC   Followed by  . [START ON 08/13/2014] rivaroxaban  20 mg Oral Q supper   Continuous Infusions: . 0.9 % NaCl with KCl 20 mEq / L 50 mL/hr at 07/28/14 0044   PRN Meds:.acetaminophen, alum & mag hydroxide-simeth, diphenhydrAMINE, ibuprofen, morphine injection, ondansetron (ZOFRAN) IV, oxyCODONE, sodium chloride  Antibiotics   Anti-infectives    Start     Dose/Rate Route Frequency Ordered Stop   07/26/14 1300  ciprofloxacin (CIPRO) IVPB 400 mg     400 mg 200 mL/hr over 60 Minutes Intravenous Every 12 hours 07/26/14 1226     07/26/14 1300  metroNIDAZOLE (FLAGYL) IVPB 500 mg     500 mg 100 mL/hr over 60 Minutes Intravenous Every 8 hours 07/26/14 1226     07/25/14 2000  ciprofloxacin (CIPRO) tablet 500 mg  Status:  Discontinued     500 mg Oral 2 times daily 07/25/14 1530 07/26/14 1226   07/25/14 2000  metroNIDAZOLE (FLAGYL) tablet 500 mg  Status:  Discontinued     500 mg Oral Every 12 hours 07/25/14 1530 07/26/14 1226   07/21/14 1200  Ampicillin-Sulbactam (UNASYN) 3 g in sodium chloride 0.9 % 100 mL IVPB  Status:  Discontinued     3 g 100 mL/hr over 60 Minutes Intravenous Every 6 hours 07/21/14 1045 07/25/14 1530   07/20/14 1600  cefTRIAXone (ROCEPHIN) 1 g in dextrose 5 % 50 mL IVPB - Premix  Status:  Discontinued     1 g 100 mL/hr over 30 Minutes Intravenous Every 24 hours 07/19/14 1819 07/20/14 1444   07/20/14 1600  vancomycin (VANCOCIN) IVPB 1000 mg/200 mL premix  Status:  Discontinued     1,000 mg 200 mL/hr over 60 Minutes Intravenous Every 8 hours 07/20/14 1513 07/21/14 1044   07/20/14 1600  ceFEPIme (MAXIPIME) 1 g in dextrose 5 % 50 mL IVPB  Status:  Discontinued     1 g 100 mL/hr over 30 Minutes Intravenous Every 8 hours 07/20/14 1513 07/21/14 1044   07/19/14 1515  cefTRIAXone (ROCEPHIN) 1 g in dextrose 5 % 50 mL IVPB     1 g 100 mL/hr over 30 Minutes Intravenous  Once 07/19/14  1507 07/19/14 1545   07/19/14 1509  cefTRIAXone (ROCEPHIN) 1 G injection    Comments:  Reich, Hazel   : cabinet override      07/19/14 1509 07/19/14 1516        Objective:   Filed Vitals:   07/27/14 2236 07/28/14 0040 07/28/14 0229 07/28/14 0556  BP: 123/104   117/69  Pulse: 117   110  Temp: 100.6 F (38.1 C) 103.1 F (39.5 C) 98.9 F (37.2 C) 99.9 F (37.7 C)  TempSrc: Oral Oral Oral Oral  Resp: 20   20  Height:      Weight:      SpO2: 98%   99%    Wt Readings from Last 3 Encounters:  07/19/14 81.6 kg (179 lb 14.3 oz) (95 %*, Z = 1.60)  06/12/14 77.111 kg (170 lb) (92 %*, Z = 1.40)  04/21/13 55.792 kg (123 lb) (46 %*, Z = -0.09)   * Growth percentiles are based on CDC 2-20 Years data.     Intake/Output Summary (Last 24 hours) at 07/28/14 0939 Last data filed at 07/28/14 0700  Gross per 24 hour  Intake   2260 ml  Output      0 ml  Net   2260 ml     Physical Exam Appears flushed. In no distress.  No cervical lymphadenopathy appriciated.  Clear to auscultation bilaterally with no rales, rhonchi or wheezing.  S1, S2 is normal, regular. No S3, S4. No rubs, murmurs, or bruit.  Bowel sounds are present. Abdomen is soft. Mild diffuse tenderness. No rebound, rigidity or guarding. No masses or organomegaly.  No Cyanosis, Clubbing or edema, No new Rash or bruise    Data Review   Micro Results Recent Results (from the past 240 hour(s))  Culture, Urine     Status: None   Collection Time: 07/19/14  6:20 PM  Result Value Ref Range Status   Specimen Description URINE, CLEAN CATCH  Final   Special Requests Normal  Final   Colony Count NO GROWTH Performed at [facility]   Final   Culture NO GROWTH Performed at [facility]   Final   Report Status 07/21/2014 FINAL  Final  Culture, blood (routine x 2)     Status: None   Collection Time: 07/19/14  7:35 PM  Result Value Ref Range Status   Specimen Description BLOOD LEFT ARM  Final   Special  Requests BAA 5CC  Final   Culture   Final    NO GROWTH 5 DAYS Performed at [facility]    Report Status 07/26/2014 FINAL  Final  Culture, blood (routine x 2)     Status: None   Collection Time: 07/19/14  7:35 PM  Result Value Ref Range Status   Specimen Description BLOOD RIGHT HAND  Final   Special Requests BOTTLES DRAWN AEROBIC ONLY 10CC  Final   Culture   Final    NO GROWTH 5 DAYS Performed at [facility]    Report Status 07/26/2014 FINAL  Final  Culture, blood (x 2)     Status: None   Collection Time: 07/20/14  4:05 PM  Result Value Ref Range Status   Specimen Description BLOOD LEFT HAND  Final   Special Requests BOTTLES DRAWN AEROBIC ONLY 6 CC BLUE  Final   Culture   Final    NO GROWTH 5 DAYS Performed at [facility]    Report Status 07/26/2014 FINAL  Final  Culture, blood (routine x 2)     Status: None (Preliminary result)   Collection Time: 07/24/14  3:40 PM  Result Value Ref Range Status   Specimen Description BLOOD LEFT ARM  Final   Special Requests BOTTLES DRAWN AEROBIC ONLY 3CC AEROBIC BOTTLE ONLY  Final   Culture   Final           BLOOD CULTURE RECEIVED NO GROWTH TO DATE CULTURE WILL BE HELD FOR 5 DAYS BEFORE ISSUING A FINAL NEGATIVE REPORT Performed at [facility]    Report Status PENDING  Incomplete  Culture, blood (routine x 2)     Status: None (Preliminary result)   Collection Time: 07/24/14  4:16 PM  Result Value Ref Range Status   Specimen Description BLOOD RIGHT HAND  Final   Special Requests  Final    BOTTLES DRAWN AEROBIC AND ANAEROBIC 10CC BLUE BOTTLE, 5CC RED BOTTLE   Culture   Final           BLOOD CULTURE RECEIVED NO GROWTH TO DATE CULTURE WILL BE HELD FOR 5 DAYS BEFORE ISSUING A FINAL NEGATIVE REPORT Performed at [facility]    Report Status PENDING  Incomplete  Clostridium Difficile by PCR     Status: None   Collection Time: 07/26/14  8:49 AM  Result Value Ref Range Status   C difficile by pcr  NEGATIVE NEGATIVE Final    Radiology Reports Dg Chest 2 View  07/27/2014   CLINICAL DATA:  Nonproductive cough for the past 2 days. Some shortness of breath and mid chest pain.  EXAM: CHEST  2 VIEW  COMPARISON:  07/20/2014 and chest CT dated 07/24/2014.  FINDINGS: Normal sized heart. Clear lungs. Left PICC tip at the superior cavoatrial junction. Thoracic spine fixation hardware and stable scoliosis.  IMPRESSION: No acute abnormality.   Electronically Signed   By: Beckie Salts M.D.   On: 07/27/2014 14:37    CBC  Recent Labs Lab 07/24/14 1130 07/25/14 0415 07/26/14 0523 07/27/14 0408 07/27/14 1851  WBC 14.1* 16.3* 16.8* 19.6* 20.7*  HGB 11.3* 10.5* 10.4* 10.5* 10.5*  HCT 34.6* 32.1* 32.5* 32.3* 33.0*  PLT 286 307 335 357 341  MCV 92.8 92.8 93.9 93.4 93.2  MCH 30.3 30.3 30.1 30.3 29.7  MCHC 32.7 32.7 32.0 32.5 31.8  RDW 16.2* 16.3* 16.7* 16.6* 16.3*  LYMPHSABS  --   --  10.6*  --  13.5*  MONOABS  --   --  1.2*  --  1.2*  EOSABS  --   --  1.0*  --  1.0*  BASOSABS  --   --  0.3*  --  0.4*    Chemistries   Recent Labs Lab 07/24/14 1130 07/25/14 0415 07/26/14 0523 07/27/14 0408 07/28/14 0350  NA 139 138 136 139 136  K 4.4 3.6 3.7 4.0 4.0  CL 107 106 106 106 105  CO2 GLUCOSE 95 110* 114* 110* 110*  BUN <5* <5* <5* 5* <5*  CREATININE 0.53 0.74 0.67 0.66 0.76  CALCIUM 8.3* 8.0* 7.7* 8.3* 8.1*  AST 95* 61* 54* 53* 54*  ALT 94* 75* 56* 48* 40*  ALKPHOS 145* 130* 116 116 110  BILITOT 0.7 0.4 0.4 0.3 0.6   Recent Labs  07/25/14 1625  FERRITIN 193      Samreet Edenfield M.D on 07/28/2014 at 9:39 AM  Between 7am to 7pm - Pager - [phone]  After 7pm go to [email or web address] - password [facility]  [facility] Office  [phone]

## 2014-07-28 NOTE — Progress Notes (Signed)
Patient ID: Carol [name], female   DOB: 1995-06-14, 19 y.o.   MRN: [id number]         [facility]    Date of Admission:  07/19/2014   Total days of antibiotics 10        Day 4 ciprofloxacin and metronidazole          Principal Problem:   Suppurative pylephlebitis Active Problems:   Abdominal pain   Portal vein thrombosis   Sepsis   Nausea and vomiting   Elevated liver enzymes   Rash   History of splenectomy   Normocytic anemia   Obesity   Septic thrombophlebitis   DVT (deep venous thrombosis)   . antiseptic oral rinse  7 mL Mouth Rinse q12n4p  . chlorhexidine  15 mL Mouth Rinse BID  . ciprofloxacin  400 mg Intravenous Q12H  . gabapentin  100 mg Oral TID  . gabapentin  300 mg Oral QHS  . metronidazole  500 mg Intravenous Q8H  . rivaroxaban  15 mg Oral BID WC   Followed by  . [START ON 08/13/2014] rivaroxaban  20 mg Oral Q supper    Subjective: [name] states that she is feeling worse. She had a bad night with fever and sweats. She continues to be bothered by abdominal pain and intermittent nausea although she has been able to eat and keep food down. She is also itching more and has more rash this morning.  Review of Systems: Pertinent items are noted in HPI.  History reviewed. No pertinent past medical history.  History  Substance Use Topics  . Smoking status: Never Smoker   . Smokeless tobacco: Not on file  . Alcohol Use: No    No family history on file. Allergies  Allergen Reactions  . Tape Hives    Can use paper tape    OBJECTIVE: Blood pressure 117/69, pulse 110, temperature 99.9 F (37.7 C), temperature source Oral, resp. rate 20, height 4\' 11"  (1.499 m), weight 179 lb 14.3 oz (81.6 kg), last menstrual period 12/18/2013, SpO2 99 %.   General: She has flushed and appears uncomfortable. She is alert with appropriate conversation. Her mother is at the bedside Skin: She has a much more prominent diffuse erythematous macular rash  on trunk and extremities Oral: No oropharyngeal lesions Eyes: Slightly injected conjunctiva Lungs: Clear  Cor: Tachycardic but regular S1 and S2 with no murmurs  Abdomen: Soft with mild upper quadrant tenderness worse on the right than on the left. Quiet bowel sounds. Extremities: No acute abnormalities   Lab Results Lab Results  Component Value Date   WBC 20.7* 07/27/2014   HGB 10.5* 07/27/2014   HCT 33.0* 07/27/2014   MCV 93.2 07/27/2014   PLT 341 07/27/2014    Lab Results  Component Value Date   CREATININE 0.76 07/28/2014   BUN <5* 07/28/2014   NA 136 07/28/2014   K 4.0 07/28/2014   CL 105 07/28/2014   CO2 23 07/28/2014    Lab Results  Component Value Date   ALT 40* 07/28/2014   AST 54* 07/28/2014   ALKPHOS 110 07/28/2014   BILITOT 0.6 07/28/2014   Cytomegalovirus IgM and IgG: Both are positive Epstein-Barr virus viral capsid antigen IgM and IgG and nuclear antigen IgG: All positive Epstein-Barr virus early antigen: Negative White blood cell count differential: Marked elevation of lymphocytes and monocytes with atypical lymphocytes and mild eosinophilia  Microbiology: Recent Results (from the past 240 hour(s))  Culture, Urine  Status: None   Collection Time: 07/19/14  6:20 PM  Result Value Ref Range Status   Specimen Description URINE, CLEAN CATCH  Final   Special Requests Normal  Final   Colony Count NO GROWTH Performed at [facility]   Final   Culture NO GROWTH Performed at [facility]   Final   Report Status 07/21/2014 FINAL  Final  Culture, blood (routine x 2)     Status: None   Collection Time: 07/19/14  7:35 PM  Result Value Ref Range Status   Specimen Description BLOOD LEFT ARM  Final   Special Requests BAA 5CC  Final   Culture   Final    NO GROWTH 5 DAYS Performed at [facility]    Report Status 07/26/2014 FINAL  Final  Culture, blood (routine x 2)     Status: None   Collection Time: 07/19/14  7:35 PM  Result  Value Ref Range Status   Specimen Description BLOOD RIGHT HAND  Final   Special Requests BOTTLES DRAWN AEROBIC ONLY 10CC  Final   Culture   Final    NO GROWTH 5 DAYS Performed at [facility]    Report Status 07/26/2014 FINAL  Final  Culture, blood (x 2)     Status: None   Collection Time: 07/20/14  4:05 PM  Result Value Ref Range Status   Specimen Description BLOOD LEFT HAND  Final   Special Requests BOTTLES DRAWN AEROBIC ONLY 6 CC BLUE  Final   Culture   Final    NO GROWTH 5 DAYS Performed at [facility]    Report Status 07/26/2014 FINAL  Final  Culture, blood (routine x 2)     Status: None (Preliminary result)   Collection Time: 07/24/14  3:40 PM  Result Value Ref Range Status   Specimen Description BLOOD LEFT ARM  Final   Special Requests BOTTLES DRAWN AEROBIC ONLY 3CC AEROBIC BOTTLE ONLY  Final   Culture   Final           BLOOD CULTURE RECEIVED NO GROWTH TO DATE CULTURE WILL BE HELD FOR 5 DAYS BEFORE ISSUING A FINAL NEGATIVE REPORT Performed at [facility]    Report Status PENDING  Incomplete  Culture, blood (routine x 2)     Status: None (Preliminary result)   Collection Time: 07/24/14  4:16 PM  Result Value Ref Range Status   Specimen Description BLOOD RIGHT HAND  Final   Special Requests   Final    BOTTLES DRAWN AEROBIC AND ANAEROBIC 10CC BLUE BOTTLE, 5CC RED BOTTLE   Culture   Final           BLOOD CULTURE RECEIVED NO GROWTH TO DATE CULTURE WILL BE HELD FOR 5 DAYS BEFORE ISSUING A FINAL NEGATIVE REPORT Performed at [facility]    Report Status PENDING  Incomplete  Clostridium Difficile by PCR     Status: None   Collection Time: 07/26/14  8:49 AM  Result Value Ref Range Status   C difficile by pcr NEGATIVE NEGATIVE Final    Assessment: She is not responding well to therapy for possible bacterial septic portal vein thrombophlebitis and her overall clinical and laboratory presentation is looking more and more like 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 Also, I cannot rule out superimposed drug allergy and rash. I talked to [name] and her mother about management options at length again today. I recommend that we stop ciprofloxacin, metronidazole and gabapentin since  she is not improving on these therapies and these new medications could be a source for fever and rash.  Although CMV infections in immunocompetent adults rarely requires specific antiviral therapy I think it is reasonable to add valganciclovir at this point given the severity and protracted nature of her illness and the presence of the portal vein clot. Multiple case reports and case series support the association between acute CMV infection and venous thrombosis but this presentation is quite rare and there are no prospective treatment trials to guide management. [name] and her mother are in agreement with this plan.  Plan: 1. Discontinue ciprofloxacin, metronidazole and gabapentin 2. Continue rivaroxaban 3. Check repeat blood cultures and CMV DNA PCR 4. Start valganciclovir 900 mg twice daily  Cliffton Asters, MD [facility] [phone] pager   [phone] cell 07/28/2014, 10:49 AM

## 2014-07-29 NOTE — Progress Notes (Signed)
Patient Demographics  Mazey Mantell, is a 19 y.o. female, DOB - 11/25/1995, JYN:[id number]  Admit date - 07/19/2014   Admitting Physician Calvert Cantor, MD  Outpatient Primary MD for the patient is [name], MD  LOS - 10   Chief Complaint  Patient presents with  . 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 On admission, patient was hemodynamically stable. She was found to have elevated LFT's which prompted abd [place] study which subsequently revealed portal vein thrombosis. CT abd confirmed this finding as well. She was started on heparin and hematology has seen the patient in consultation. Patient continues to have significant high fever, infectious disease were consulted, patient was thought to have septic portal vein thrombophlebitis, initially she was on IV vancomycin and cefepime given her history of splenectomy, antibiotic were transitioned to IV Unasyn to cover for anaerobes per ID recommendation , then transitioned to IV Cipro and Flagyl. Subsequently, CMV and EBV titers came back elevated. All antibiotics were discontinued and she was started on Valcyte on April 20.  Subjective:   Patient continues to feel poorly. Reports poor appetite. Tolerating liquids. Rash is about the same.   Assessment & Plan    Principal Problem:   Suppurative pylephlebitis Active Problems:   Abdominal pain   Portal vein thrombosis   Sepsis   Nausea and vomiting   History of splenectomy   Normocytic anemia   Obesity   Elevated liver enzymes   Rash   Septic thrombophlebitis   DVT (deep venous thrombosis)    Cytomegalovirus   Sepsis/septic portal vein thrombophlebitis/persistent fever/diffuse skin rash - Closely followed by infectious diseases. Continues to spike fever. -Blood cultures have no growth to date. Repeated yesterday, April 20. Those are still pending. - Antibiotics were changed to IV Unasyn 4/13 to provide additional anaerobic coverage, then transitioned to Cipro and Flagyl on 4/17 for possible superimposed component of drug fever. Inserted PICC line in anticipation of IV antibiotics for the first few weeks. ID has discontinued all antibiotics. Started on Valcyte on April 20. - Repeat CT abdomen and pelvis showing persistent thrombus in the right portal vein and right hepatic lobe portal vein branch, (which would explain her persistent fever) and mild reactive mesenteric adenopathy, no evidence of any liver abscess. - CMV IgM and IgG both are positive, EBV IgM, IgG and nuclear antigen is positive,  white blood cell differential showing significant elevation of lymphocytes, findings suggests the possibility of reactivation of infection and acute mononucleosis syndrome. - UTI specimen is contaminated,  no growth on urine culture. - Follow-up appointments with hematology on May 5.  Elevated liver enzymes - This is in the setting of septic portal vein thrombophlebitis, versus reactivation of infection  mononucleosis syndrome. Counts have improved.  Portal vein thrombosis -Patient started initially on heparin drip, transitioned to Xarelto.  Complains of left inner thigh pain with mild erythema. -venous Doppler negative for DVT   DVT prophylaxis: Full anticoagulation Code Status: Full code Family Communication:  Discussed with the patient. Disposition Plan: Not ready for discharge.   Consults   Infectious disease  Hematology   Medications  Scheduled Meds: . antiseptic oral rinse  7 mL Mouth Rinse q12n4p  . chlorhexidine  15 mL Mouth Rinse BID  . rivaroxaban  15 mg Oral BID WC    Followed by  . [START ON 08/13/2014] rivaroxaban  20 mg Oral Q supper  . valGANciclovir  900 mg Oral BID   Continuous Infusions: . 0.9 % NaCl with KCl 20 mEq / L 50 mL/hr at 07/28/14 2334   PRN Meds:.acetaminophen, alum & mag hydroxide-simeth, hydrOXYzine, ibuprofen, morphine injection, ondansetron (ZOFRAN) IV, oxyCODONE, sodium chloride  Antibiotics   Anti-infectives    Start     Dose/Rate Route Frequency Ordered Stop   07/28/14 1200  valGANciclovir (VALCYTE) 450 MG tablet TABS 900 mg     900 mg Oral 2 times daily 07/28/14 1105     07/26/14 1300  ciprofloxacin (CIPRO) IVPB 400 mg  Status:  Discontinued     400 mg 200 mL/hr over 60 Minutes Intravenous Every 12 hours 07/26/14 1226 07/28/14 1105   07/26/14 1300  metroNIDAZOLE (FLAGYL) IVPB 500 mg  Status:  Discontinued     500 mg 100 mL/hr over 60 Minutes Intravenous Every 8 hours 07/26/14 1226 07/28/14 1105   07/25/14 2000  ciprofloxacin (CIPRO) tablet 500 mg  Status:  Discontinued     500 mg Oral 2 times daily 07/25/14 1530 07/26/14 1226   07/25/14 2000  metroNIDAZOLE (FLAGYL) tablet 500 mg  Status:  Discontinued     500 mg Oral Every 12 hours 07/25/14 1530 07/26/14 1226   07/21/14 1200  Ampicillin-Sulbactam (UNASYN) 3 g in sodium chloride 0.9 % 100 mL IVPB  Status:  Discontinued     3 g 100 mL/hr over 60 Minutes Intravenous Every 6 hours 07/21/14 1045 07/25/14 1530   07/20/14 1600  cefTRIAXone (ROCEPHIN) 1 g in dextrose 5 % 50 mL IVPB - Premix  Status:  Discontinued     1 g 100 mL/hr over 30 Minutes Intravenous Every 24 hours 07/19/14 1819 07/20/14 1444   07/20/14 1600  vancomycin (VANCOCIN) IVPB 1000 mg/200 mL premix  Status:  Discontinued     1,000 mg 200 mL/hr over 60 Minutes Intravenous Every 8 hours 07/20/14 1513 07/21/14 1044   07/20/14 1600  ceFEPIme (MAXIPIME) 1 g in dextrose 5 % 50 mL IVPB  Status:  Discontinued     1 g 100 mL/hr over 30 Minutes Intravenous Every 8 hours 07/20/14 1513 07/21/14 1044   07/19/14 1515   cefTRIAXone (ROCEPHIN) 1 g in dextrose 5 % 50 mL IVPB     1 g 100 mL/hr over 30 Minutes Intravenous  Once 07/19/14 1507 07/19/14 1545   07/19/14 1509  cefTRIAXone (ROCEPHIN) 1 G injection    Comments:  Reich, Hazel   : cabinet override      07/19/14 1509 07/19/14 1516        Objective:   Filed Vitals:   07/28/14 1055 07/28/14 1517 07/28/14 2134 07/29/14 0647  BP:  111/53 105/56 123/69  Pulse:  117 131 102  Temp: 99.6 F (37.6 C) 103 F (39.4 C) 102.8 F (39.3 C) 97.8 F (36.6 C)  TempSrc: Oral Oral Oral Oral  Resp:  20 20 16   Height:      Weight:      SpO2:  98% 96% 98%    Wt Readings from Last 3 Encounters:  07/19/14 81.6 kg (179 lb  14.3 oz) (95 %*, Z = 1.60)  06/12/14 77.111 kg (170 lb) (92 %*, Z = 1.40)  04/21/13 55.792 kg (123 lb) (46 %*, Z = -0.09)   * Growth percentiles are based on CDC 2-20 Years data.     Intake/Output Summary (Last 24 hours) at 07/29/14 1014 Last data filed at 07/29/14 0600  Gross per 24 hour  Intake   1870 ml  Output      0 ml  Net   1870 ml     Physical Exam Appears flushed. In no distress.  No cervical lymphadenopathy appriciated.  Clear to auscultation bilaterally with no rales, rhonchi or wheezing.  S1, S2 is normal, regular. No S3, S4. No rubs, murmurs, or bruit.  Bowel sounds are present. Abdomen is soft. Mild diffuse tenderness. No rebound, rigidity or guarding. No masses or organomegaly.  Diffuse macular rash noted as before.   Data Review   Micro Results Recent Results (from the past 240 hour(s))  Culture, Urine     Status: None   Collection Time: 07/19/14  6:20 PM  Result Value Ref Range Status   Specimen Description URINE, CLEAN CATCH  Final   Special Requests Normal  Final   Colony Count NO GROWTH Performed at [facility]   Final   Culture NO GROWTH Performed at [facility]   Final   Report Status 07/21/2014 FINAL  Final  Culture, blood (routine x 2)     Status: None   Collection Time:  07/19/14  7:35 PM  Result Value Ref Range Status   Specimen Description BLOOD LEFT ARM  Final   Special Requests BAA 5CC  Final   Culture   Final    NO GROWTH 5 DAYS Performed at [facility]    Report Status 07/26/2014 FINAL  Final  Culture, blood (routine x 2)     Status: None   Collection Time: 07/19/14  7:35 PM  Result Value Ref Range Status   Specimen Description BLOOD RIGHT HAND  Final   Special Requests BOTTLES DRAWN AEROBIC ONLY 10CC  Final   Culture   Final    NO GROWTH 5 DAYS Performed at [facility]    Report Status 07/26/2014 FINAL  Final  Culture, blood (x 2)     Status: None   Collection Time: 07/20/14  4:05 PM  Result Value Ref Range Status   Specimen Description BLOOD LEFT HAND  Final   Special Requests BOTTLES DRAWN AEROBIC ONLY 6 CC BLUE  Final   Culture   Final    NO GROWTH 5 DAYS Performed at [facility]    Report Status 07/26/2014 FINAL  Final  Culture, blood (routine x 2)     Status: None (Preliminary result)   Collection Time: 07/24/14  3:40 PM  Result Value Ref Range Status   Specimen Description BLOOD LEFT ARM  Final   Special Requests BOTTLES DRAWN AEROBIC ONLY 3CC AEROBIC BOTTLE ONLY  Final   Culture   Final           BLOOD CULTURE RECEIVED NO GROWTH TO DATE CULTURE WILL BE HELD FOR 5 DAYS BEFORE ISSUING A FINAL NEGATIVE REPORT Performed at [facility]    Report Status PENDING  Incomplete  Culture, blood (routine x 2)     Status: None (Preliminary result)   Collection Time: 07/24/14  4:16 PM  Result Value Ref Range Status   Specimen Description BLOOD RIGHT HAND  Final   Special  Requests   Final    BOTTLES DRAWN AEROBIC AND ANAEROBIC 10CC BLUE BOTTLE, 5CC RED BOTTLE   Culture   Final           BLOOD CULTURE RECEIVED NO GROWTH TO DATE CULTURE WILL BE HELD FOR 5 DAYS BEFORE ISSUING A FINAL NEGATIVE REPORT Performed at [facility]    Report Status PENDING  Incomplete  Clostridium Difficile by PCR      Status: None   Collection Time: 07/26/14  8:49 AM  Result Value Ref Range Status   C difficile by pcr NEGATIVE NEGATIVE Final  Culture, blood (routine x 2)     Status: None (Preliminary result)   Collection Time: 07/28/14 12:15 PM  Result Value Ref Range Status   Specimen Description BLOOD LEFT HAND  Final   Special Requests BOTTLES DRAWN AEROBIC ONLY 5CC  Final   Culture   Final           BLOOD CULTURE RECEIVED NO GROWTH TO DATE CULTURE WILL BE HELD FOR 5 DAYS BEFORE ISSUING A FINAL NEGATIVE REPORT Performed at [facility]    Report Status PENDING  Incomplete  Culture, blood (routine x 2)     Status: None (Preliminary result)   Collection Time: 07/28/14 12:20 PM  Result Value Ref Range Status   Specimen Description BLOOD RIGHT ARM  Final   Special Requests BOTTLES DRAWN AEROBIC ONLY 1CC  Final   Culture   Final           BLOOD CULTURE RECEIVED NO GROWTH TO DATE CULTURE WILL BE HELD FOR 5 DAYS BEFORE ISSUING A FINAL NEGATIVE REPORT Performed at [facility]    Report Status PENDING  Incomplete    Radiology Reports Dg Chest 2 View  07/27/2014   CLINICAL DATA:  Nonproductive cough for the past 2 days. Some shortness of breath and mid chest pain.  EXAM: CHEST  2 VIEW  COMPARISON:  07/20/2014 and chest CT dated 07/24/2014.  FINDINGS: Normal sized heart. Clear lungs. Left PICC tip at the superior cavoatrial junction. Thoracic spine fixation hardware and stable scoliosis.  IMPRESSION: No acute abnormality.   Electronically Signed   By: Beckie Salts M.D.   On: 07/27/2014 14:37    CBC  Recent Labs Lab 07/24/14 1130 07/25/14 0415 07/26/14 0523 07/27/14 0408 07/27/14 1851  WBC 14.1* 16.3* 16.8* 19.6* 20.7*  HGB 11.3* 10.5* 10.4* 10.5* 10.5*  HCT 34.6* 32.1* 32.5* 32.3* 33.0*  PLT 286 307 335 357 341  MCV 92.8 92.8 93.9 93.4 93.2  MCH 30.3 30.3 30.1 30.3 29.7  MCHC 32.7 32.7 32.0 32.5 31.8  RDW 16.2* 16.3* 16.7* 16.6* 16.3*  LYMPHSABS  --   --  10.6*  --  13.5*   MONOABS  --   --  1.2*  --  1.2*  EOSABS  --   --  1.0*  --  1.0*  BASOSABS  --   --  0.3*  --  0.4*    Chemistries   Recent Labs Lab 07/24/14 1130 07/25/14 0415 07/26/14 0523 07/27/14 0408 07/28/14 0350  NA 139 138 136 139 136  K 4.4 3.6 3.7 4.0 4.0  CL 107 106 106 106 105  CO2 22 26 24 25 23   GLUCOSE 95 110* 114* 110* 110*  BUN <5* <5* <5* 5* <5*  CREATININE 0.53 0.74 0.67 0.66 0.76  CALCIUM 8.3* 8.0* 7.7* 8.3* 8.1*  AST 95* 61* 54* 53* 54*  ALT 94* 75* 56* 48* 40*  ALKPHOS 145* 130* 116 116 110  BILITOT 0.7 0.4 0.4 0.3 0.6  No results for input(s): VITAMINB12, FOLATE, FERRITIN, TIBC, IRON, RETICCTPCT in the last 72 hours.    Deidrick Rainey M.D on 07/29/2014 at 10:14 AM  Between 7am to 7pm - Pager - [phone]  After 7pm go to [email or web address] - password [email or web address]  [facility] Office  [phone]

## 2014-07-29 NOTE — Progress Notes (Signed)
INITIAL NUTRITION ASSESSMENT  DOCUMENTATION CODES Per approved criteria  -Obesity unspecified   INTERVENTION: - Continue current diet order  - Recommended pt order Mighty Shakes with meals - RD to continue to monitor for suggestions  NUTRITION DIAGNOSIS: Inadequate protein-energy intakes related to inability to keep PO down at times which began PTA as evidenced by pt report.   Goal: Pt to meet >/= 90% estimated needs  Monitor:  PO intakes and tolerance, weight trends, labs, I/O's  Reason for Assessment: LOS day 74  19 y.o. female  Admitting Dx: Suppurative pylephlebitis  ASSESSMENT: Pt is a 19 year old who was admitted 4/11. 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   Pt with ongoing fevers. She has been eating 0-100% since admission, highly variable. Pt reports poor appetite and vomiting with intakes x3 weeks now. She states that she feels nauseous even if she does not have vomiting. Pt reports that for the past 2-3 days she has only had liquids and that she is unable to tolerate foods unless they are bland. A visitor in the room has been bringing smoothies for pt and pt reports that these have greatly helped; called the kitchen and they are unable to provide smoothies or milk shakes.   Talked with pt about trying Mighty Shake over ice to increase protein intake. Pt agreeable to plan. She has not been meeting needs. She is unsure of weight changes but reports UBW of around 170 lbs.  No signs of wasting on physical exam. Labs reviewed; BUN low. Pt at 95th percentile for weight for age per CDC chart.  Height: Ht Readings from Last 1 Encounters:  07/19/14  (1.499 m) (2 %*, Z = -2.07)   * Growth percentiles are based on CDC 2-20 Years data.    Weight: Wt Readings from Last 1 Encounters:  07/19/14 179 lb  14.3 oz (81.6 kg) (95 %*, Z = 1.60)   * Growth percentiles are based on CDC 2-20 Years data.    Ideal Body Weight: 128 lbs (58 kg)  % Ideal Body Weight: 140%  Wt Readings from Last 10 Encounters:  07/19/14 179 lb 14.3 oz (81.6 kg) (95 %*, Z = 1.60)  06/12/14 170 lb (77.111 kg) (92 %*, Z = 1.40)  04/21/13 123 lb (55.792 kg) (46 %*, Z = -0.09)   * Growth percentiles are based on CDC 2-20 Years data.    Usual Body Weight: 170 lbs (77.27 kg)  % Usual Body Weight: 105%  BMI:  Body mass index is 36.32 kg/(m^2).  Estimated Nutritional Needs: Kcal: 1600-1800  Protein: 80-100 grams Fluid: 2-3L/day  Skin: WDL  Diet Order: Diet regular Room service appropriate?: Yes; Fluid consistency:: Thin  EDUCATION NEEDS: -No education needs identified at this time   Intake/Output Summary (Last 24 hours) at 07/29/14 1306 Last data filed at 07/29/14 0600  Gross per 24 hour  Intake   1630 ml  Output      0 ml  Net   1630 ml    Last BM: no documentation    Labs:   Recent Labs Lab 07/26/14 0523 07/27/14 0408 07/28/14 0350  NA 136 139 136  K 3.7 4.0 4.0  CL 106 106 105  CO2 BUN <5* 5* <5*  CREATININE 0.67 0.66 0.76  CALCIUM 7.7* 8.3* 8.1*  GLUCOSE 114* 110* 110*    CBG (last 3)  No results for input(s): GLUCAP in the last 72 hours.  Scheduled Meds: . antiseptic oral rinse  7 mL Mouth Rinse q12n4p  . chlorhexidine  15 mL Mouth Rinse BID  . rivaroxaban  15 mg Oral BID WC   Followed by  . [START ON 08/13/2014] rivaroxaban  20 mg Oral Q supper  . valGANciclovir  900 mg Oral BID    Continuous Infusions: . 0.9 % NaCl with KCl 20 mEq / L 50 mL/hr at 07/28/14 2334    History reviewed. No pertinent past medical history.  Past Surgical History  Procedure Laterality Date  . Back surgery    . Splenectomy, total      Trenton GammonJessica Dorina Ribaudo, RD, LDN Inpatient Clinical Dietitian Pager # [phone] After hours/weekend pager # [phone]

## 2014-07-29 NOTE — Progress Notes (Signed)
Advised the pt and the nurse that showering with a PICC line increases her risk of developing an infection.  The pt expressed understanding and said she would not shower. Carol [name], Carol [name]

## 2014-07-29 NOTE — Progress Notes (Signed)
COURTESY NOTE:  Carol [name] has an appt with me 5/5 at 2"15 PM  Will sign off at this point. .Please lLet me or my partners know if we can be of further help.

## 2014-07-29 NOTE — Progress Notes (Signed)
Patient ID: Carol [name], female   DOB: 28-Dec-1995, 19 y.o.   MRN: [id number]         [facility]    Date of Admission:  07/19/2014           Day 2 valganciclovir         Principal Problem:   Suppurative pylephlebitis Active Problems:   Abdominal pain   Portal vein thrombosis   Sepsis   Nausea and vomiting   Elevated liver enzymes   Rash   History of splenectomy   Normocytic anemia   Obesity   Septic thrombophlebitis   DVT (deep venous thrombosis)   Cytomegalovirus   . antiseptic oral rinse  7 mL Mouth Rinse q12n4p  . chlorhexidine  15 mL Mouth Rinse BID  . rivaroxaban  15 mg Oral BID WC   Followed by  . [START ON 08/13/2014] rivaroxaban  20 mg Oral Q supper  . valGANciclovir  900 mg Oral BID    Subjective: She is still itching. She states that she's not had any nausea today but has not tried to eat much. [name] not had any difficulty tolerating valganciclovir. She states that her abdominal pain is unchanged.  Review of Systems: Pertinent items are noted in HPI.  History reviewed. No pertinent past medical history.  History  Substance Use Topics  . Smoking status: Never Smoker   . Smokeless tobacco: Not on file  . Alcohol Use: No    No family history on file. Allergies  Allergen Reactions  . Tape Hives    Can use paper tape    OBJECTIVE: Blood pressure 137/76, pulse 116, temperature 98.1 F (36.7 C), temperature source Oral, resp. rate 20, height  (1.499 m), weight 179 lb 14.3 oz (81.6 kg), last menstrual period 12/18/2013, SpO2 100 %.   General: She looks more comfortable today. Skin: Her generalized maculopapular rash is still prominent on her trunk and extremities but it is less erythematous Oral: No oropharyngeal lesions Lungs: Clear  Cor: Regular S1 and S2 with no murmurs  Abdomen: Soft with very mild upper quadrant tenderness. Quiet bowel sounds. Extremities: No acute abnormalities   Lab  Results Cytomegalovirus IgM and IgG: Both are positive Epstein-Barr virus viral capsid antigen IgM and IgG and nuclear antigen IgG: All positive Epstein-Barr virus early antigen: Negative White blood cell count differential: Marked elevation of lymphocytes and monocytes with atypical lymphocytes and mild eosinophilia  Microbiology: Recent Results (from the past 240 hour(s))  Culture, Urine     Status: None   Collection Time: 07/19/14  6:20 PM  Result Value Ref Range Status   Specimen Description URINE, CLEAN CATCH  Final   Special Requests Normal  Final   Colony Count NO GROWTH Performed at [facility]   Final   Culture NO GROWTH Performed at [facility]   Final   Report Status 07/21/2014 FINAL  Final  Culture, blood (routine x 2)     Status: None   Collection Time: 07/19/14  7:35 PM  Result Value Ref Range Status   Specimen Description BLOOD LEFT ARM  Final   Special Requests BAA 5CC  Final   Culture   Final    NO GROWTH 5 DAYS Performed at [facility]    Report Status 07/26/2014 FINAL  Final  Culture, blood (routine x 2)     Status: None   Collection Time: 07/19/14  7:35 PM  Result Value Ref Range Status   Specimen  Description BLOOD RIGHT HAND  Final   Special Requests BOTTLES DRAWN AEROBIC ONLY 10CC  Final   Culture   Final    NO GROWTH 5 DAYS Performed at [facility]    Report Status 07/26/2014 FINAL  Final  Culture, blood (x 2)     Status: None   Collection Time: 07/20/14  4:05 PM  Result Value Ref Range Status   Specimen Description BLOOD LEFT HAND  Final   Special Requests BOTTLES DRAWN AEROBIC ONLY 6 CC BLUE  Final   Culture   Final    NO GROWTH 5 DAYS Performed at [facility]    Report Status 07/26/2014 FINAL  Final  Culture, blood (routine x 2)     Status: None (Preliminary result)   Collection Time: 07/24/14  3:40 PM  Result Value Ref Range Status   Specimen Description BLOOD LEFT ARM  Final   Special  Requests BOTTLES DRAWN AEROBIC ONLY 3CC AEROBIC BOTTLE ONLY  Final   Culture   Final           BLOOD CULTURE RECEIVED NO GROWTH TO DATE CULTURE WILL BE HELD FOR 5 DAYS BEFORE ISSUING A FINAL NEGATIVE REPORT Performed at [facility]    Report Status PENDING  Incomplete  Culture, blood (routine x 2)     Status: None (Preliminary result)   Collection Time: 07/24/14  4:16 PM  Result Value Ref Range Status   Specimen Description BLOOD RIGHT HAND  Final   Special Requests   Final    BOTTLES DRAWN AEROBIC AND ANAEROBIC 10CC BLUE BOTTLE, 5CC RED BOTTLE   Culture   Final           BLOOD CULTURE RECEIVED NO GROWTH TO DATE CULTURE WILL BE HELD FOR 5 DAYS BEFORE ISSUING A FINAL NEGATIVE REPORT Performed at [facility]    Report Status PENDING  Incomplete  Clostridium Difficile by PCR     Status: None   Collection Time: 07/26/14  8:49 AM  Result Value Ref Range Status   C difficile by pcr NEGATIVE NEGATIVE Final  Culture, blood (routine x 2)     Status: None (Preliminary result)   Collection Time: 07/28/14 12:15 PM  Result Value Ref Range Status   Specimen Description BLOOD LEFT HAND  Final   Special Requests BOTTLES DRAWN AEROBIC ONLY 5CC  Final   Culture   Final           BLOOD CULTURE RECEIVED NO GROWTH TO DATE CULTURE WILL BE HELD FOR 5 DAYS BEFORE ISSUING A FINAL NEGATIVE REPORT Performed at [facility]    Report Status PENDING  Incomplete  Culture, blood (routine x 2)     Status: None (Preliminary result)   Collection Time: 07/28/14 12:20 PM  Result Value Ref Range Status   Specimen Description BLOOD RIGHT ARM  Final   Special Requests BOTTLES DRAWN AEROBIC ONLY 1CC  Final   Culture   Final           BLOOD CULTURE RECEIVED NO GROWTH TO DATE CULTURE WILL BE HELD FOR 5 DAYS BEFORE ISSUING A FINAL NEGATIVE REPORT Performed at [facility]    Report Status PENDING  Incomplete    Assessment:  She still has fever and rash. It is too early for the  valganciclovir to have any impact if this is indeed due to cytomegalovirus infection. Allergic reaction to some new drug is also still in the mix.  Plan: 1. Continue valganciclovir  2. Continue rivaroxaban 3. Await results of repeat blood cultures and CMV DNA PCR  Cliffton AstersJohn Jerian Morais, MD [facility] [phone] pager   [phone] 07/29/2014, 3:55 PM

## 2014-07-30 LAB — COMPREHENSIVE METABOLIC PANEL
ALT: 32 U/L (ref 0–35)
AST: 51 U/L — ABNORMAL HIGH (ref 0–37)
Albumin: 2.4 g/dL — ABNORMAL LOW (ref 3.5–5.2)
Alkaline Phosphatase: 102 U/L (ref 39–117)
Anion gap: 6 (ref 5–15)
BUN: 7 mg/dL (ref 6–23)
CALCIUM: 8.1 mg/dL — AB (ref 8.4–10.5)
CO2: 25 mmol/L (ref 19–32)
CREATININE: 0.69 mg/dL (ref 0.50–1.10)
Chloride: 107 mmol/L (ref 96–112)
GFR calc non Af Amer: >90 mL/min (ref >90)
GLUCOSE: 123 mg/dL — AB (ref 70–99)
Potassium: 3.7 mmol/L (ref 3.5–5.1)
Sodium: 138 mmol/L (ref 135–145)
TOTAL PROTEIN: 5.7 g/dL — AB (ref 6.0–8.3)
Total Bilirubin: 0.2 mg/dL — ABNORMAL LOW (ref 0.3–1.2)

## 2014-07-30 LAB — CULTURE, BLOOD (ROUTINE X 2)
CULTURE: NO GROWTH
CULTURE: NO GROWTH

## 2014-07-30 LAB — CMV (CYTOMEGALOVIRUS) DNA ULTRAQUANT, PCR
CMV DNA Quant: 6930 IU/mL
Log10 CMV Qn DNA Pl: 3.841 log10 IU/mL

## 2014-07-30 LAB — CBC
HCT: 31.6 % — ABNORMAL LOW (ref 36.0–46.0)
Hemoglobin: 10.3 g/dL — ABNORMAL LOW (ref 12.0–15.0)
MCH: 30.6 pg (ref 26.0–34.0)
MCHC: 32.6 g/dL (ref 30.0–36.0)
MCV: 93.8 fL (ref 78.0–100.0)
Platelets: 352 10*3/uL (ref 150–400)
RBC: 3.37 MIL/uL — ABNORMAL LOW (ref 3.87–5.11)
RDW: 17.3 % — AB (ref 11.5–15.5)
WBC: 21.1 10*3/uL — AB (ref 4.0–10.5)

## 2014-07-30 LAB — CRYOGLOBULIN

## 2014-07-30 NOTE — Progress Notes (Signed)
Patient ID: Carol [name], female   DOB: 14-Dec-1995, 19 y.o.   MRN: [id number]         [facility]    Date of Admission:  07/19/2014           Day 3 valganciclovir         Principal Problem:   Suppurative pylephlebitis Active Problems:   Abdominal pain   Portal vein thrombosis   Sepsis   Nausea and vomiting   Elevated liver enzymes   Rash   History of splenectomy   Normocytic anemia   Obesity   Septic thrombophlebitis   DVT (deep venous thrombosis)   Cytomegalovirus   . antiseptic oral rinse  7 mL Mouth Rinse q12n4p  . chlorhexidine  15 mL Mouth Rinse BID  . rivaroxaban  15 mg Oral BID WC   Followed by  . [START ON 08/13/2014] rivaroxaban  20 mg Oral Q supper  . valGANciclovir  900 mg Oral BID    Subjective: She says that she is feeling worse today. She says that she has pain all over and it is not controlled with IV morphine. She is very upset and emotional. She states that she doesn't feel like anyone cares about her and that " they are not doing the job". She has not had any nausea or vomiting and has been able to tolerate solid food. [name] had no problems tolerating her oral medications. She admits that her itching and rash are improving.  Review of Systems: Pertinent items are noted in HPI.  History reviewed. No pertinent past medical history.  History  Substance Use Topics  . Smoking status: Never Smoker   . Smokeless tobacco: Not on file  . Alcohol Use: No    No family history on file. Allergies  Allergen Reactions  . Tape Hives    Can use paper tape    OBJECTIVE: Blood pressure 116/58, pulse 90, temperature 97.3 F (36.3 C), temperature source Oral, resp. rate 20, height 4\' 11"  (1.499 m), weight 179 lb 14.3 oz (81.6 kg), last menstrual period 12/18/2013, SpO2 97 %. No documented fever since just before noon yesterday but she and her mom state that she had a temperature of 102 at 8 PM last night.  General: She is sitting  on the side of her bed crying Skin: Her generalized maculopapular rash is much improved Oral: No oropharyngeal lesions Lungs: Clear  Cor: Regular S1 and S2 with no murmurs  Abdomen: Soft. Firm pressure with my stethoscope does not seem to elicit any pain. Quiet bowel sounds Extremities: No acute abnormalities   Lab Results Cytomegalovirus IgM and IgG: Both are positive Epstein-Barr virus viral capsid antigen IgM and IgG and nuclear antigen IgG: All positive Epstein-Barr virus early antigen: Negative White blood cell count differential: Marked elevation of lymphocytes and monocytes with atypical lymphocytes and mild eosinophilia  Microbiology: Recent Results (from the past 240 hour(s))  Culture, blood (x 2)     Status: None   Collection Time: 07/20/14  4:05 PM  Result Value Ref Range Status   Specimen Description BLOOD LEFT HAND  Final   Special Requests BOTTLES DRAWN AEROBIC ONLY 6 CC BLUE  Final   Culture   Final    NO GROWTH 5 DAYS Performed at [facility]    Report Status 07/26/2014 FINAL  Final  Culture, blood (routine x 2)     Status: None   Collection Time: 07/24/14  3:40 PM  Result Value Ref Range  Status   Specimen Description BLOOD LEFT ARM  Final   Special Requests BOTTLES DRAWN AEROBIC ONLY 3CC AEROBIC BOTTLE ONLY  Final   Culture   Final    NO GROWTH 5 DAYS Performed at [facility]    Report Status 07/30/2014 FINAL  Final  Culture, blood (routine x 2)     Status: None   Collection Time: 07/24/14  4:16 PM  Result Value Ref Range Status   Specimen Description BLOOD RIGHT HAND  Final   Special Requests   Final    BOTTLES DRAWN AEROBIC AND ANAEROBIC 10CC BLUE BOTTLE, 5CC RED BOTTLE   Culture   Final    NO GROWTH 5 DAYS Performed at [facility]    Report Status 07/30/2014 FINAL  Final  Clostridium Difficile by PCR     Status: None   Collection Time: 07/26/14  8:49 AM  Result Value Ref Range Status   C difficile by pcr NEGATIVE  NEGATIVE Final  Culture, blood (routine x 2)     Status: None (Preliminary result)   Collection Time: 07/28/14 12:15 PM  Result Value Ref Range Status   Specimen Description BLOOD LEFT HAND  Final   Special Requests BOTTLES DRAWN AEROBIC ONLY 5CC  Final   Culture   Final           BLOOD CULTURE RECEIVED NO GROWTH TO DATE CULTURE WILL BE HELD FOR 5 DAYS BEFORE ISSUING A FINAL NEGATIVE REPORT Performed at [facility]    Report Status PENDING  Incomplete  Culture, blood (routine x 2)     Status: None (Preliminary result)   Collection Time: 07/28/14 12:20 PM  Result Value Ref Range Status   Specimen Description BLOOD RIGHT ARM  Final   Special Requests BOTTLES DRAWN AEROBIC ONLY 1CC  Final   Culture   Final           BLOOD CULTURE RECEIVED NO GROWTH TO DATE CULTURE WILL BE HELD FOR 5 DAYS BEFORE ISSUING A FINAL NEGATIVE REPORT Performed at [facility]    Report Status PENDING  Incomplete    Assessment: I believe she is starting to improve. Her documented temperature curve is better and her liver enzymes continued to decline. Her rash is fading. It may be that valganciclovir is helping with acute or reactivated CMV infection and/or she may have had an allergic reaction with fever and rash that is resolving off ciprofloxacin, metronidazole and gabapentin. She is struggling with pain that is out of proportion to the remainder of her clinical picture. I told her that I thought she might be able to go home soon on oral medication and that she might convalesce there better than in the hospital. This seemed to calm her down initially but she states that she still wants to stay here in the hospital.  Plan: 1. Continue valganciclovir 2. Continue rivaroxaban 3. Await results of repeat blood cultures and CMV DNA PCR 4. I will have my partner, Dr. Luciana [name], follow-up this weekend  Carol Asters, MD [facility] [phone] pager    [phone] cell 07/30/2014, 2:00 PM

## 2014-07-30 NOTE — Progress Notes (Signed)
Patient Demographics  Carol [name], is a 19 y.o. female, DOB - [birth year], ZOX:[id number]  Admit date - 07/19/2014   Admitting Physician Calvert Cantor, MD  Outpatient Primary MD for the patient is [name], MD  LOS - 11   Chief Complaint  Patient presents with  . 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 On admission, patient was hemodynamically stable. She was found to have elevated LFT's which prompted abd [place] study which subsequently revealed portal vein thrombosis. CT abd confirmed this finding as well. She was started on heparin and hematology has seen the patient in consultation. Patient continues to have significant high fever, infectious disease were consulted, patient was thought to have septic portal vein thrombophlebitis, initially she was on IV vancomycin and cefepime given her history of splenectomy, antibiotic were transitioned to IV Unasyn to cover for anaerobes per ID recommendation , then transitioned to IV Cipro and Flagyl. Subsequently, CMV and EBV titers came back elevated. All antibiotics were discontinued and she was started on Valcyte on April 20.  Subjective:   Patient thinks that her rash may be improving. She did have fever last night as well. Appetite remains poor.   Assessment & Plan    Principal Problem:   Suppurative pylephlebitis Active Problems:   Abdominal pain   Portal vein thrombosis   Sepsis   Nausea and vomiting   History of splenectomy   Normocytic anemia   Obesity   Elevated liver enzymes   Rash   Septic thrombophlebitis   DVT (deep venous thrombosis)    Cytomegalovirus   Sepsis/septic portal vein thrombophlebitis/persistent fever/diffuse skin rash - Closely followed by infectious diseases. Continues to spike fever -Blood cultures have no growth to date. Repeated April 20. Those are negative so far. - Antibiotics were changed to IV Unasyn 4/13 to provide additional anaerobic coverage, then transitioned to Cipro and Flagyl on 4/17 for possible superimposed component of drug fever. Inserted PICC line in anticipation of IV antibiotics for the first few weeks. ID has discontinued all antibiotics. Started on Valcyte on April 20. - Repeat CT abdomen and pelvis showing persistent thrombus in the right portal vein and right hepatic lobe portal vein branch, (which would explain her persistent fever) and mild reactive mesenteric adenopathy, no evidence of any liver abscess. - CMV IgM and IgG both are positive, EBV IgM, IgG and nuclear antigen is positive. WBC differential showing significant elevation of lymphocytes. Findings suggests the possibility of reactivation of infection and acute mononucleosis syndrome. - UTI specimen is contaminated,  no growth on urine culture. - Follow-up appointment with hematology on May 5.  Elevated liver enzymes - This is in the setting of septic portal vein thrombophlebitis, versus reactivation of infection  mononucleosis syndrome. Counts have improved.  Portal vein thrombosis -Patient started initially on heparin drip, transitioned to Xarelto.  Complains of left inner thigh pain with mild erythema. -venous Doppler negative for DVT   DVT prophylaxis: Full anticoagulation Code Status: Full code Family Communication:  Discussed with the patient. Disposition Plan: Not ready for discharge.   Consults   Infectious disease  Hematology  2-D echocardiogram Study Conclusions - Left ventricle: The cavity size was normal. Wall thickness wasnormal. The estimated ejection fraction was 65%. Wall motion wasnormal; there  were no regional wall motion abnormalities. - Right ventricle: The cavity size was normal. Systolic functionwas normal. - Impressions: No obvious vegetations seen. Impressions: - No obvious vegetations seen.  Medications  Scheduled Meds: . antiseptic oral rinse  7 mL Mouth Rinse q12n4p  . chlorhexidine  15 mL Mouth Rinse BID  . rivaroxaban  15 mg Oral BID WC   Followed by  . [START ON 08/13/2014] rivaroxaban  20 mg Oral Q supper  . valGANciclovir  900 mg Oral BID   Continuous Infusions: . 0.9 % NaCl with KCl 20 mEq / L 50 mL/hr at 07/29/14 1924   PRN Meds:.acetaminophen, alum & mag hydroxide-simeth, hydrOXYzine, ibuprofen, morphine injection, ondansetron (ZOFRAN) IV, oxyCODONE, sodium chloride  Antibiotics   Anti-infectives    Start     Dose/Rate Route Frequency Ordered Stop   07/28/14 1200  valGANciclovir (VALCYTE) 450 MG tablet TABS 900 mg     900 mg Oral 2 times daily 07/28/14 1105     07/26/14 1300  ciprofloxacin (CIPRO) IVPB 400 mg  Status:  Discontinued     400 mg 200 mL/hr over 60 Minutes Intravenous Every 12 hours 07/26/14 1226 07/28/14 1105   07/26/14 1300  metroNIDAZOLE (FLAGYL) IVPB 500 mg  Status:  Discontinued     500 mg 100 mL/hr over 60 Minutes Intravenous Every 8 hours 07/26/14 1226 07/28/14 1105   07/25/14 2000  ciprofloxacin (CIPRO) tablet 500 mg  Status:  Discontinued     500 mg Oral 2 times daily 07/25/14 1530 07/26/14 1226   07/25/14 2000  metroNIDAZOLE (FLAGYL) tablet 500 mg  Status:  Discontinued     500 mg Oral Every 12 hours 07/25/14 1530 07/26/14 1226   07/21/14 1200  Ampicillin-Sulbactam (UNASYN) 3 g in sodium chloride 0.9 % 100 mL IVPB  Status:  Discontinued     3 g 100 mL/hr over 60 Minutes Intravenous Every 6 hours 07/21/14 1045 07/25/14 1530   07/20/14 1600  cefTRIAXone (ROCEPHIN) 1 g in dextrose 5 % 50 mL IVPB - Premix  Status:  Discontinued     1 g 100 mL/hr over 30 Minutes Intravenous Every 24 hours 07/19/14 1819 07/20/14 1444   07/20/14  1600  vancomycin (VANCOCIN) IVPB 1000 mg/200 mL premix  Status:  Discontinued     1,000 mg 200 mL/hr over 60 Minutes Intravenous Every 8 hours 07/20/14 1513 07/21/14 1044   07/20/14 1600  ceFEPIme (MAXIPIME) 1 g in dextrose 5 % 50 mL IVPB  Status:  Discontinued     1 g 100 mL/hr over 30 Minutes Intravenous Every 8 hours 07/20/14 1513 07/21/14 1044   07/19/14 1515  cefTRIAXone (ROCEPHIN) 1 g in dextrose 5 % 50 mL IVPB     1 g 100 mL/hr over 30 Minutes Intravenous  Once 07/19/14 1507 07/19/14 1545   07/19/14 1509  cefTRIAXone (ROCEPHIN) 1 G injection    Comments:  Lauro Regulus   : cabinet override      07/19/14 1509 07/19/14 1516        Objective:   Filed Vitals:   07/29/14 1145 07/29/14 1513 07/29/14 2206 07/30/14 0615  BP:  137/76 106/63 116/58  Pulse:  116 124 90  Temp: 102.4 F (39.1 C) 98.1 F (36.7  C) 99.5 F (37.5 C) 97.3 F (36.3 C)  TempSrc: Oral Oral Oral Oral  Resp:  20 16 20   Height:      Weight:      SpO2:  100% 95% 97%    Wt Readings from Last 3 Encounters:  07/19/14 81.6 kg (179 lb 14.3 oz) (95 %*, Z = 1.60)  06/12/14 77.111 kg (170 lb) (92 %*, Z = 1.40)  04/21/13 55.792 kg (123 lb) (46 %*, Z = -0.09)   * Growth percentiles are based on CDC 2-20 Years data.     Intake/Output Summary (Last 24 hours) at 07/30/14 0837 Last data filed at 07/30/14 0600  Gross per 24 hour  Intake    900 ml  Output      0 ml  Net    900 ml     Physical Exam Awake and alert. In no distress..  No cervical lymphadenopathy appriciated.  Clear to auscultation bilaterally with no rales, rhonchi or wheezing.  S1, S2 is normal, regular. No S3, S4. No rubs, murmurs, or bruit.  Bowel sounds are present. Abdomen is soft. Mild diffuse tenderness. No rebound, rigidity or guarding. No masses or organomegaly.  Diffuse macular rash appears to be less intense than before   Data Review   Micro Results Recent Results (from the past 240 hour(s))  Culture, blood (x 2)     Status:  None   Collection Time: 07/20/14  4:05 PM  Result Value Ref Range Status   Specimen Description BLOOD LEFT HAND  Final   Special Requests BOTTLES DRAWN AEROBIC ONLY 6 CC BLUE  Final   Culture   Final    NO GROWTH 5 DAYS Performed at [facility]    Report Status 07/26/2014 FINAL  Final  Culture, blood (routine x 2)     Status: None   Collection Time: 07/24/14  3:40 PM  Result Value Ref Range Status   Specimen Description BLOOD LEFT ARM  Final   Special Requests BOTTLES DRAWN AEROBIC ONLY 3CC AEROBIC BOTTLE ONLY  Final   Culture   Final    NO GROWTH 5 DAYS Performed at [facility]    Report Status 07/30/2014 FINAL  Final  Culture, blood (routine x 2)     Status: None   Collection Time: 07/24/14  4:16 PM  Result Value Ref Range Status   Specimen Description BLOOD RIGHT HAND  Final   Special Requests   Final    BOTTLES DRAWN AEROBIC AND ANAEROBIC 10CC BLUE BOTTLE, 5CC RED BOTTLE   Culture   Final    NO GROWTH 5 DAYS Performed at [facility]    Report Status 07/30/2014 FINAL  Final  Clostridium Difficile by PCR     Status: None   Collection Time: 07/26/14  8:49 AM  Result Value Ref Range Status   C difficile by pcr NEGATIVE NEGATIVE Final  Culture, blood (routine x 2)     Status: None (Preliminary result)   Collection Time: 07/28/14 12:15 PM  Result Value Ref Range Status   Specimen Description BLOOD LEFT HAND  Final   Special Requests BOTTLES DRAWN AEROBIC ONLY 5CC  Final   Culture   Final           BLOOD CULTURE RECEIVED NO GROWTH TO DATE CULTURE WILL BE HELD FOR 5 DAYS BEFORE ISSUING A FINAL NEGATIVE REPORT Performed at [facility]    Report Status PENDING  Incomplete  Culture, blood (routine x 2)  Status: None (Preliminary result)   Collection Time: 07/28/14 12:20 PM  Result Value Ref Range Status   Specimen Description BLOOD RIGHT ARM  Final   Special Requests BOTTLES DRAWN AEROBIC ONLY 1CC  Final   Culture   Final            BLOOD CULTURE RECEIVED NO GROWTH TO DATE CULTURE WILL BE HELD FOR 5 DAYS BEFORE ISSUING A FINAL NEGATIVE REPORT Performed at [facility]    Report Status PENDING  Incomplete    Radiology Reports No results found.  CBC  Recent Labs Lab 07/25/14 0415 07/26/14 0523 07/27/14 0408 07/27/14 1851 07/30/14 0505  WBC 16.3* 16.8* 19.6* 20.7* 21.1*  HGB 10.5* 10.4* 10.5* 10.5* 10.3*  HCT 32.1* 32.5* 32.3* 33.0* 31.6*  PLT 307 335 357 341 352  MCV 92.8 93.9 93.4 93.2 93.8  MCH 30.3 30.1 30.3 29.7 30.6  MCHC 32.7 32.0 32.5 31.8 32.6  RDW 16.3* 16.7* 16.6* 16.3* 17.3*  LYMPHSABS  --  10.6*  --  13.5*  --   MONOABS  --  1.2*  --  1.2*  --   EOSABS  --  1.0*  --  1.0*  --   BASOSABS  --  0.3*  --  0.4*  --     Chemistries   Recent Labs Lab 07/25/14 0415 07/26/14 0523 07/27/14 0408 07/28/14 0350 07/30/14 0505  NA 138 136 139 136 138  K 3.6 3.7 4.0 4.0 3.7  CL 106 106 106 105 107  CO2 GLUCOSE 110* 114* 110* 110* 123*  BUN <5* <5* 5* <5* 7  CREATININE 0.74 0.67 0.66 0.76 0.69  CALCIUM 8.0* 7.7* 8.3* 8.1* 8.1*  AST 61* 54* 53* 54* 51*  ALT 75* 56* 48* 40* 32  ALKPHOS 130* 116 116 110 102  BILITOT 0.4 0.4 0.3 0.6 0.2*  No results for input(s): VITAMINB12, FOLATE, FERRITIN, TIBC, IRON, RETICCTPCT in the last 72 hours.    Dominick Morella M.D on 07/30/2014 at 8:37 AM  Between 7am to 7pm - Pager - [phone]  After 7pm go to [email or web address] - password [facility]  [facility] Office  [phone]

## 2014-07-30 NOTE — Progress Notes (Addendum)
I have reviewed and agree with the previous RN, Connye BurkittAlly. Pt complains of "feeling awful" and having pain, but had made several laps around the unit with family while laughing. Pt's demeanor changes when RN enters the room. Pt has her own thermometer which she uses to check her temperature frequently telling staff that she is running a temp and feeling hot while lying under 5-7 blankets at any given time. Will continue to monitor.

## 2014-07-31 LAB — CBC
HCT: 32.1 % — ABNORMAL LOW (ref 36.0–46.0)
Hemoglobin: 10.2 g/dL — ABNORMAL LOW (ref 12.0–15.0)
MCH: 30 pg (ref 26.0–34.0)
MCHC: 31.8 g/dL (ref 30.0–36.0)
MCV: 94.4 fL (ref 78.0–100.0)
PLATELETS: 403 10*3/uL — AB (ref 150–400)
RBC: 3.4 MIL/uL — ABNORMAL LOW (ref 3.87–5.11)
RDW: 17.5 % — ABNORMAL HIGH (ref 11.5–15.5)
WBC: 20 10*3/uL — ABNORMAL HIGH (ref 4.0–10.5)

## 2014-07-31 LAB — COMPREHENSIVE METABOLIC PANEL
ALBUMIN: 2.5 g/dL — AB (ref 3.5–5.2)
ALK PHOS: 100 U/L (ref 39–117)
ALT: 32 U/L (ref 0–35)
ANION GAP: 8 (ref 5–15)
AST: 56 U/L — ABNORMAL HIGH (ref 0–37)
BILIRUBIN TOTAL: 0.3 mg/dL (ref 0.3–1.2)
BUN: <5 mg/dL — ABNORMAL LOW (ref 6–23)
CO2: 24 mmol/L (ref 19–32)
Calcium: 8.4 mg/dL (ref 8.4–10.5)
Chloride: 109 mmol/L (ref 96–112)
Creatinine, Ser: 0.67 mg/dL (ref 0.50–1.10)
GFR calc Af Amer: >90 mL/min (ref >90)
Glucose, Bld: 110 mg/dL — ABNORMAL HIGH (ref 70–99)
POTASSIUM: 4.2 mmol/L (ref 3.5–5.1)
Sodium: 141 mmol/L (ref 135–145)
Total Protein: 5.8 g/dL — ABNORMAL LOW (ref 6.0–8.3)

## 2014-07-31 NOTE — Progress Notes (Signed)
[facility]  Date of Admission:  07/19/2014  Antibiotics: valgancyclovir  Subjective: Feels better, eating, happy to be on the mend  Objective: Temp:  [98.1 F (36.7 C)-100.9 F (38.3 C)] 98.4 F (36.9 C) (04/23 1355) Pulse Rate:  [100-114] 100 (04/23 1355) Resp:  [18-20] 18 (04/23 1355) BP: (99-126)/(56-79) 126/79 mmHg (04/23 1355) SpO2:  [94 %-100 %] 100 % (04/23 1355)  General: awake, alert, nad Skin: faint rash Lungs: CTA B Cor: RRR Abdomen: soft, nt, nd, +bs Ext: no edema  Lab Results Lab Results  Component Value Date   WBC 20.0* 07/31/2014   HGB 10.2* 07/31/2014   HCT 32.1* 07/31/2014   MCV 94.4 07/31/2014   PLT 403* 07/31/2014    Lab Results  Component Value Date   CREATININE 0.67 07/31/2014   BUN <5* 07/31/2014   NA 141 07/31/2014   K 4.2 07/31/2014   CL 109 07/31/2014   CO2 24 07/31/2014    Lab Results  Component Value Date   ALT 32 07/31/2014   AST 56* 07/31/2014   ALKPHOS 100 07/31/2014   BILITOT 0.3 07/31/2014      Microbiology: Recent Results (from the past 240 hour(s))  Culture, blood (routine x 2)     Status: None   Collection Time: 07/24/14  3:40 PM  Result Value Ref Range Status   Specimen Description BLOOD LEFT ARM  Final   Special Requests BOTTLES DRAWN AEROBIC ONLY 3CC AEROBIC BOTTLE ONLY  Final   Culture   Final    NO GROWTH 5 DAYS Performed at [facility]    Report Status 07/30/2014 FINAL  Final  Culture, blood (routine x 2)     Status: None   Collection Time: 07/24/14  4:16 PM  Result Value Ref Range Status   Specimen Description BLOOD RIGHT HAND  Final   Special Requests   Final    BOTTLES DRAWN AEROBIC AND ANAEROBIC 10CC BLUE BOTTLE, 5CC RED BOTTLE   Culture   Final    NO GROWTH 5 DAYS Performed at [facility]    Report Status 07/30/2014 FINAL  Final  Clostridium Difficile by PCR     Status: None   Collection Time: 07/26/14  8:49 AM  Result Value Ref Range Status   C  difficile by pcr NEGATIVE NEGATIVE Final  Culture, blood (routine x 2)     Status: None (Preliminary result)   Collection Time: 07/28/14 12:15 PM  Result Value Ref Range Status   Specimen Description BLOOD LEFT HAND  Final   Special Requests BOTTLES DRAWN AEROBIC ONLY 5CC  Final   Culture   Final           BLOOD CULTURE RECEIVED NO GROWTH TO DATE CULTURE WILL BE HELD FOR 5 DAYS BEFORE ISSUING A FINAL NEGATIVE REPORT Performed at [facility]    Report Status PENDING  Incomplete  Culture, blood (routine x 2)     Status: None (Preliminary result)   Collection Time: 07/28/14 12:20 PM  Result Value Ref Range Status   Specimen Description BLOOD RIGHT ARM  Final   Special Requests BOTTLES DRAWN AEROBIC ONLY 1CC  Final   Culture   Final           BLOOD CULTURE RECEIVED NO GROWTH TO DATE CULTURE WILL BE HELD FOR 5 DAYS BEFORE ISSUING A FINAL NEGATIVE REPORT Performed at [facility]    Report Status PENDING  Incomplete    Studies/Results: No results  found.  Assessment/Plan:  1) CMV infection - CMV DNA positive, discussed with patient, mom and bf.  Seems to be much better and now pleasant and pleased with no high fever today.   Dr. Orvan Falconer back on Monday, I am avaialble tomorrow if needed. thanks  Staci Righter, MD [facility] [email or web address] [phone] pager   [phone] cell 07/31/2014, 6:07 PM

## 2014-07-31 NOTE — Progress Notes (Signed)
Pt slept most of the night without incident. Pt received Oxy IR at 0517 when woken up for IV team lab draw, but returned to sleep after. Will continue to monitor.

## 2014-07-31 NOTE — Progress Notes (Signed)
Patient Demographics  Carol [name], is a 19 y.o. female, DOB - Jun 05, 1995, ZOX:[id number]  Admit date - 07/19/2014   Admitting Physician Calvert Cantor, MD  Outpatient Primary MD for the patient is [name], MD  LOS - 12   Chief Complaint  Patient presents with  . 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 On admission, patient was hemodynamically stable. She was found to have elevated LFT's which prompted abd [place] study which subsequently revealed portal vein thrombosis. CT abd confirmed this finding as well. She was started on heparin and hematology has seen the patient in consultation. Patient continues to have significant high fever, infectious disease were consulted, patient was thought to have septic portal vein thrombophlebitis, initially she was on IV vancomycin and cefepime given her history of splenectomy, antibiotic were transitioned to IV Unasyn to cover for anaerobes per ID recommendation , then transitioned to IV Cipro and Flagyl. Subsequently, CMV and EBV titers came back elevated. All antibiotics were discontinued and she was started on Valcyte on April 20.  Subjective:   Patient feels better. Appetite still remains poor. Rash is improving. Fevers are getting better.   Assessment & Plan    Principal Problem:   Suppurative pylephlebitis Active Problems:   Abdominal pain   Portal vein thrombosis   Sepsis   Nausea and vomiting   History of splenectomy   Normocytic anemia   Obesity   Elevated liver enzymes   Rash   Septic thrombophlebitis   DVT (deep venous thrombosis)   Cytomegalovirus   Sepsis/septic  portal vein thrombophlebitis/possible acute versus reactivation of CMV/diffuse skin rash - She seems to be finally improving. Fever appears to be subsiding. Skin rash is getting better. It appears that this could be acute or reactivated CMV. -Blood cultures have no growth to date. Repeated April 20. Those are negative so far. - Started on [place] on April 20. She has been on multiple different antibiotics during this hospitalization including Unasyn, ciprofloxacin, Flagyl. PICC line was placed. Overall seems to be improving. Now remains only on Valcyte.  - Repeat CT abdomen and pelvis showing persistent thrombus in the right portal vein and right hepatic lobe portal vein branch, (which would explain her persistent fever) and mild reactive mesenteric adenopathy, no evidence of any liver abscess. - CMV IgM and IgG both are positive, EBV IgM, IgG and nuclear antigen is positive. WBC differential showing significant elevation of lymphocytes. Findings suggests the possibility of reactivation of infection and acute mononucleosis syndrome. - UTI specimen is contaminated,  no growth on urine culture. - Follow-up appointment with hematology on May 5.  Elevated liver enzymes - This is in the setting of septic portal vein thrombophlebitis, versus reactivation of infection  mononucleosis syndrome. Counts have improved.  Portal vein thrombosis -Patient started initially on heparin drip, transitioned to Xarelto.  Complains of left inner thigh pain with mild erythema. -venous Doppler negative for DVT   DVT prophylaxis: Full anticoagulation Code Status: Full code Family Communication:  Discussed with the patient and her mother Disposition Plan: Patient is improving slowly. Possible discharge early next week.   Consults   Infectious disease  Hematology  2-D echocardiogram Study Conclusions - Left ventricle: The cavity size was normal. Wall thickness wasnormal. The estimated ejection fraction was 65%.  Wall motion wasnormal; there were no regional wall motion abnormalities. - Right ventricle: The cavity size was normal. Systolic functionwas normal. - Impressions: No obvious vegetations seen. Impressions: - No obvious vegetations seen.  Medications  Scheduled Meds: . antiseptic oral rinse  7 mL Mouth Rinse q12n4p  . chlorhexidine  15 mL Mouth Rinse BID  . rivaroxaban  15 mg Oral BID WC   Followed by  . [START ON 08/13/2014] rivaroxaban  20 mg Oral Q supper  . valGANciclovir  900 mg Oral BID   Continuous Infusions: . 0.9 % NaCl with KCl 20 mEq / L 50 mL/hr at 07/30/14 1517   PRN Meds:.acetaminophen, alum & mag hydroxide-simeth, hydrOXYzine, ibuprofen, morphine injection, ondansetron (ZOFRAN) IV, oxyCODONE, sodium chloride  Antibiotics   Anti-infectives    Start     Dose/Rate Route Frequency Ordered Stop   07/28/14 1200  valGANciclovir (VALCYTE) 450 MG tablet TABS 900 mg     900 mg Oral 2 times daily 07/28/14 1105     07/26/14 1300  ciprofloxacin (CIPRO) IVPB 400 mg  Status:  Discontinued     400 mg 200 mL/hr over 60 Minutes Intravenous Every 12 hours 07/26/14 1226 07/28/14 1105   07/26/14 1300  metroNIDAZOLE (FLAGYL) IVPB 500 mg  Status:  Discontinued     500 mg 100 mL/hr over 60 Minutes Intravenous Every 8 hours 07/26/14 1226 07/28/14 1105   07/25/14 2000  ciprofloxacin (CIPRO) tablet 500 mg  Status:  Discontinued     500 mg Oral 2 times daily 07/25/14 1530 07/26/14 1226   07/25/14 2000  metroNIDAZOLE (FLAGYL) tablet 500 mg  Status:  Discontinued     500 mg Oral Every 12 hours 07/25/14 1530 07/26/14 1226   07/21/14 1200  Ampicillin-Sulbactam (UNASYN) 3 g in sodium chloride 0.9 % 100 mL IVPB  Status:  Discontinued     3 g 100 mL/hr over 60 Minutes Intravenous Every 6 hours 07/21/14 1045 07/25/14 1530   07/20/14 1600  cefTRIAXone (ROCEPHIN) 1 g in dextrose 5 % 50 mL IVPB - Premix  Status:  Discontinued     1 g 100 mL/hr over 30 Minutes Intravenous Every 24 hours 07/19/14 1819  07/20/14 1444   07/20/14 1600  vancomycin (VANCOCIN) IVPB 1000 mg/200 mL premix  Status:  Discontinued     1,000 mg 200 mL/hr over 60 Minutes Intravenous Every 8 hours 07/20/14 1513 07/21/14 1044   07/20/14 1600  ceFEPIme (MAXIPIME) 1 g in dextrose 5 % 50 mL IVPB  Status:  Discontinued     1 g 100 mL/hr over 30 Minutes Intravenous Every 8 hours 07/20/14 1513 07/21/14 1044   07/19/14 1515  cefTRIAXone (ROCEPHIN) 1 g in dextrose 5 % 50 mL IVPB     1 g 100 mL/hr over 30 Minutes Intravenous  Once 07/19/14 1507 07/19/14 1545   07/19/14 1509  cefTRIAXone (ROCEPHIN) 1 G injection    Comments:  Lauro Regulus   : cabinet override      07/19/14 1509 07/19/14 1516        Objective:   Filed Vitals:   07/30/14 1419 07/30/14 1931 07/30/14 2129 07/31/14 0505  BP: 104/68  119/70 99/56  Pulse: 125  114 109  Temp: 100.8 F (38.2 C) 100.9 F (38.3 C) 99.5 F (37.5 C) 98.1 F (36.7 C)  TempSrc: Oral Oral Oral Oral  Resp: 18  18 20   Height:      Weight:      SpO2: 96%  94% 98%    Wt Readings from Last 3 Encounters:  07/19/14 81.6 kg (179 lb 14.3 oz) (95 %*, Z = 1.60)  06/12/14 77.111 kg (170 lb) (92 %*, Z = 1.40)  04/21/13 55.792 kg (123 lb) (46 %*, Z = -0.09)   * Growth percentiles are based on CDC 2-20 Years data.     Intake/Output Summary (Last 24 hours) at 07/31/14 0804 Last data filed at 07/31/14 0520  Gross per 24 hour  Intake    700 ml  Output      0 ml  Net    700 ml     Physical Exam Awake and alert. In no distress..  No cervical lymphadenopathy appriciated.  Clear to auscultation bilaterally with no rales, rhonchi or wheezing.  S1, S2 is normal, regular. No S3, S4. No rubs, murmurs, or bruit.  Bowel sounds are present. Abdomen is soft. Mild diffuse tenderness. No rebound, rigidity or guarding. No masses or organomegaly.  Diffuse macular rash is much better.    Data Review   Micro Results Recent Results (from the past 240 hour(s))  Culture, blood (routine x 2)      Status: None   Collection Time: 07/24/14  3:40 PM  Result Value Ref Range Status   Specimen Description BLOOD LEFT ARM  Final   Special Requests BOTTLES DRAWN AEROBIC ONLY 3CC AEROBIC BOTTLE ONLY  Final   Culture   Final    NO GROWTH 5 DAYS Performed at [facility]    Report Status 07/30/2014 FINAL  Final  Culture, blood (routine x 2)     Status: None   Collection Time: 07/24/14  4:16 PM  Result Value Ref Range Status   Specimen Description BLOOD RIGHT HAND  Final   Special Requests   Final    BOTTLES DRAWN AEROBIC AND ANAEROBIC 10CC BLUE BOTTLE, 5CC RED BOTTLE   Culture   Final    NO GROWTH 5 DAYS Performed at [facility]    Report Status 07/30/2014 FINAL  Final  Clostridium Difficile by PCR     Status: None   Collection Time: 07/26/14  8:49 AM  Result Value Ref Range Status   C difficile by pcr NEGATIVE NEGATIVE Final  Culture, blood (routine x 2)     Status: None (Preliminary result)   Collection Time: 07/28/14 12:15 PM  Result Value Ref Range Status   Specimen Description BLOOD LEFT HAND  Final   Special Requests BOTTLES DRAWN AEROBIC ONLY 5CC  Final   Culture   Final           BLOOD CULTURE RECEIVED NO GROWTH TO DATE CULTURE WILL BE HELD FOR 5 DAYS BEFORE ISSUING A FINAL NEGATIVE REPORT Performed at [facility]    Report Status PENDING  Incomplete  Culture, blood (routine x 2)     Status: None (Preliminary result)   Collection Time: 07/28/14 12:20 PM  Result Value Ref Range Status   Specimen Description BLOOD RIGHT ARM  Final   Special Requests BOTTLES DRAWN AEROBIC ONLY 1CC  Final   Culture   Final           BLOOD CULTURE RECEIVED NO GROWTH TO DATE CULTURE WILL BE HELD FOR 5  DAYS BEFORE ISSUING A FINAL NEGATIVE REPORT Performed at [facility]    Report Status PENDING  Incomplete    Radiology Reports No results found.  CBC  Recent Labs Lab 07/26/14 0523 07/27/14 0408 07/27/14 1851 07/30/14 0505 07/31/14 0520  WBC  16.8* 19.6* 20.7* 21.1* 20.0*  HGB 10.4* 10.5* 10.5* 10.3* 10.2*  HCT 32.5* 32.3* 33.0* 31.6* 32.1*  PLT 335 357 341 352 403*  MCV 93.9 93.4 93.2 93.8 94.4  MCH 30.1 30.3 29.7 30.6 30.0  MCHC 32.0 32.5 31.8 32.6 31.8  RDW 16.7* 16.6* 16.3* 17.3* 17.5*  LYMPHSABS 10.6*  --  13.5*  --   --   MONOABS 1.2*  --  1.2*  --   --   EOSABS 1.0*  --  1.0*  --   --   BASOSABS 0.3*  --  0.4*  --   --     Chemistries   Recent Labs Lab 07/26/14 0523 07/27/14 0408 07/28/14 0350 07/30/14 0505 07/31/14 0520  NA 136 139 136 138 141  K 3.7 4.0 4.0 3.7 4.2  CL 106 106 105 107 109  CO2 GLUCOSE 114* 110* 110* 123* 110*  BUN <5* 5* <5* 7 <5*  CREATININE 0.67 0.66 0.76 0.69 0.67  CALCIUM 7.7* 8.3* 8.1* 8.1* 8.4  AST 54* 53* 54* 51* 56*  ALT 56* 48* 40* 32 32  ALKPHOS 116 116 110 102 100  BILITOT 0.4 0.3 0.6 0.2* 0.3  No results for input(s): VITAMINB12, FOLATE, FERRITIN, TIBC, IRON, RETICCTPCT in the last 72 hours.    Yoltzin Ransom M.D on 07/31/2014 at 8:04 AM  Between 7am to 7pm - Pager - [phone]  After 7pm go to [email or web address] - password [facility]  [facility] Office  [phone]

## 2014-08-01 NOTE — Progress Notes (Signed)
Patient Demographics  Carol [name], is a 19 y.o. female, DOB - 17-Nov-1995, ZOX:[id number]  Admit date - 07/19/2014   Admitting Physician Calvert Cantor, MD  Outpatient Primary MD for the patient is [name], MD  LOS - 13   Chief Complaint  Patient presents with  . 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 On admission, patient was hemodynamically stable. She was found to have elevated LFT's which prompted abd [place] study which subsequently revealed portal vein thrombosis. CT abd confirmed this finding as well. She was started on heparin and hematology has seen the patient in consultation. Patient continues to have significant high fever, infectious disease were consulted, patient was thought to have septic portal vein thrombophlebitis, initially she was on IV vancomycin and cefepime given her history of splenectomy, antibiotic were transitioned to IV Unasyn to cover for anaerobes per ID recommendation , then transitioned to IV Cipro and Flagyl. Subsequently, CMV and EBV titers came back elevated. All antibiotics were discontinued and she was started on Valcyte on April 20. Patient is much improved. Fever appears to be subsiding.  Subjective:   Patient continues to improve. Appetite is improving. Rash is better.   Assessment & Plan    Principal Problem:   Suppurative pylephlebitis Active Problems:   Abdominal pain   Portal vein thrombosis   Sepsis   Nausea and vomiting   History of splenectomy   Normocytic anemia   Obesity   Elevated liver enzymes   Rash   Septic thrombophlebitis   DVT (deep venous thrombosis)  Cytomegalovirus   Sepsis/septic portal vein thrombophlebitis/possible acute versus reactivation of CMV/diffuse skin rash - She seems to be finally improving. Fever appears to be subsiding with no episodes over the last 24 hours. Skin rash is getting better. It appears that this could have been acute or reactivated CMV. -Blood cultures have no growth to date. Repeated April 20. Those are negative so far. - Started on [place] on April 20. She has been on multiple different antibiotics during this hospitalization including Unasyn, ciprofloxacin, Flagyl. Now remains only on Valcyte. PICC line was placed. Overall seems to be improving.  - Repeat CT abdomen and pelvis showing persistent thrombus in the right portal vein and right hepatic lobe portal vein branch, (which would explain her persistent fever) and mild reactive mesenteric adenopathy, no evidence of any liver abscess. - CMV IgM and IgG both are positive, EBV IgM, IgG and nuclear antigen is positive. WBC differential showed significant elevation of lymphocytes. Findings suggests the possibility of reactivation of infection and acute mononucleosis syndrome. - UTI specimen is contaminated,  no growth on urine culture. - Follow-up appointment with hematology on May 5.  Elevated liver enzymes - This is in the setting of septic portal vein thrombophlebitis, versus reactivation of infection  mononucleosis syndrome. Counts have improved.  Portal vein thrombosis -Patient started initially on heparin drip, transitioned to Xarelto.  Complains of left inner thigh pain with mild erythema. -venous Doppler negative for DVT  DVT prophylaxis: Full anticoagulation Code Status: Full code Family Communication:  Discussed with the patient. Discussed with her mother on 4/23. Disposition Plan: Asian is improving. Anticipate discharge tomorrow.   Consults   Infectious disease  Hematology  2-D echocardiogram Study Conclusions - Left ventricle: The  cavity size was normal. Wall thickness wasnormal. The estimated ejection fraction was 65%. Wall motion wasnormal; there were no regional wall motion abnormalities. - Right ventricle: The cavity size was normal. Systolic functionwas normal. - Impressions: No obvious vegetations seen. Impressions: - No obvious vegetations seen.  Medications  Scheduled Meds: . antiseptic oral rinse  7 mL Mouth Rinse q12n4p  . chlorhexidine  15 mL Mouth Rinse BID  . rivaroxaban  15 mg Oral BID WC   Followed by  . [START ON 08/13/2014] rivaroxaban  20 mg Oral Q supper  . valGANciclovir  900 mg Oral BID   Continuous Infusions:   PRN Meds:.acetaminophen, alum & mag hydroxide-simeth, hydrOXYzine, ibuprofen, morphine injection, ondansetron (ZOFRAN) IV, oxyCODONE, sodium chloride  Antibiotics   Anti-infectives    Start     Dose/Rate Route Frequency Ordered Stop   07/28/14 1200  valGANciclovir (VALCYTE) 450 MG tablet TABS 900 mg     900 mg Oral 2 times daily 07/28/14 1105     07/26/14 1300  ciprofloxacin (CIPRO) IVPB 400 mg  Status:  Discontinued     400 mg 200 mL/hr over 60 Minutes Intravenous Every 12 hours 07/26/14 1226 07/28/14 1105   07/26/14 1300  metroNIDAZOLE (FLAGYL) IVPB 500 mg  Status:  Discontinued     500 mg 100 mL/hr over 60 Minutes Intravenous Every 8 hours 07/26/14 1226 07/28/14 1105   07/25/14 2000  ciprofloxacin (CIPRO) tablet 500 mg  Status:  Discontinued     500 mg Oral 2 times daily 07/25/14 1530 07/26/14 1226   07/25/14 2000  metroNIDAZOLE (FLAGYL) tablet 500 mg  Status:  Discontinued     500 mg Oral Every 12 hours 07/25/14 1530 07/26/14 1226   07/21/14 1200  Ampicillin-Sulbactam (UNASYN) 3 g in sodium chloride 0.9 % 100 mL IVPB  Status:  Discontinued     3 g 100 mL/hr over 60 Minutes Intravenous Every 6 hours 07/21/14 1045 07/25/14 1530   07/20/14 1600  cefTRIAXone (ROCEPHIN) 1 g in dextrose 5 % 50 mL IVPB - Premix  Status:  Discontinued     1 g 100 mL/hr over 30 Minutes  Intravenous Every 24 hours 07/19/14 1819 07/20/14 1444   07/20/14 1600  vancomycin (VANCOCIN) IVPB 1000 mg/200 mL premix  Status:  Discontinued     1,000 mg 200 mL/hr over 60 Minutes Intravenous Every 8 hours 07/20/14 1513 07/21/14 1044   07/20/14 1600  ceFEPIme (MAXIPIME) 1 g in dextrose 5 % 50 mL IVPB  Status:  Discontinued     1 g 100 mL/hr over 30 Minutes Intravenous Every 8 hours 07/20/14 1513 07/21/14 1044   07/19/14 1515  cefTRIAXone (ROCEPHIN) 1 g in dextrose 5 % 50 mL IVPB     1 g 100 mL/hr over 30 Minutes Intravenous  Once 07/19/14 1507 07/19/14 1545   07/19/14 1509  cefTRIAXone (ROCEPHIN) 1 G injection    Comments:  Lauro RegulusReich, Haz[place]   : cabinet override      07/19/14 1509 07/19/14 1516        Objective:   Filed Vitals:   07/31/14 0505 07/31/14 1355 07/31/14 2104 08/01/14 0500  BP: 99/56 126/79 132/69 109/63  Pulse: 109 100 98 74  Temp: 98.1 F (36.7  C) 98.4 F (36.9 C) 99.2 F (37.3 C) 98.2 F (36.8 C)  TempSrc: Oral Oral Oral Oral  Resp: Height:      Weight:      SpO2: 98% 100% 98% 99%    Wt Readings from Last 3 Encounters:  07/19/14 81.6 kg (179 lb 14.3 oz) (95 %*, Z = 1.60)  06/12/14 77.111 kg (170 lb) (92 %*, Z = 1.40)  04/21/13 55.792 kg (123 lb) (46 %*, Z = -0.09)   * Growth percentiles are based on CDC 2-20 Years data.     Intake/Output Summary (Last 24 hours) at 08/01/14 0855 Last data filed at 07/31/14 2200  Gross per 24 hour  Intake   1110 ml  Output      0 ml  Net   1110 ml     Physical Exam Awake and alert. No cervical lymphadenopathy appriciated.  Clear to auscultation bilaterally with no rales, rhonchi or wheezing.  S1, S2 is normal, regular. No S3, S4. No rubs, murmurs, or bruit.  Bowel sounds are present. Abdomen is soft. Mild diffuse tenderness. Less tender than before. No rebound, rigidity or guarding. No masses or organomegaly.  Diffuse macular rash is much better.    Data Review   Micro Results Recent Results  (from the past 240 hour(s))  Culture, blood (routine x 2)     Status: None   Collection Time: 07/24/14  3:40 PM  Result Value Ref Range Status   Specimen Description BLOOD LEFT ARM  Final   Special Requests BOTTLES DRAWN AEROBIC ONLY 3CC AEROBIC BOTTLE ONLY  Final   Culture   Final    NO GROWTH 5 DAYS Performed at [facility]    Report Status 07/30/2014 FINAL  Final  Culture, blood (routine x 2)     Status: None   Collection Time: 07/24/14  4:16 PM  Result Value Ref Range Status   Specimen Description BLOOD RIGHT HAND  Final   Special Requests   Final    BOTTLES DRAWN AEROBIC AND ANAEROBIC 10CC BLUE BOTTLE, 5CC RED BOTTLE   Culture   Final    NO GROWTH 5 DAYS Performed at [facility]    Report Status 07/30/2014 FINAL  Final  Clostridium Difficile by PCR     Status: None   Collection Time: 07/26/14  8:49 AM  Result Value Ref Range Status   C difficile by pcr NEGATIVE NEGATIVE Final  Culture, blood (routine x 2)     Status: None (Preliminary result)   Collection Time: 07/28/14 12:15 PM  Result Value Ref Range Status   Specimen Description BLOOD LEFT HAND  Final   Special Requests BOTTLES DRAWN AEROBIC ONLY 5CC  Final   Culture   Final           BLOOD CULTURE RECEIVED NO GROWTH TO DATE CULTURE WILL BE HELD FOR 5 DAYS BEFORE ISSUING A FINAL NEGATIVE REPORT Performed at [facility]    Report Status PENDING  Incomplete  Culture, blood (routine x 2)     Status: None (Preliminary result)   Collection Time: 07/28/14 12:20 PM  Result Value Ref Range Status   Specimen Description BLOOD RIGHT ARM  Final   Special Requests BOTTLES DRAWN AEROBIC ONLY 1CC  Final   Culture   Final           BLOOD CULTURE RECEIVED NO GROWTH TO DATE CULTURE WILL BE HELD FOR 5 DAYS BEFORE ISSUING A FINAL NEGATIVE  REPORT Performed at [facility]    Report Status PENDING  Incomplete    Radiology Reports No results found.  CBC  Recent Labs Lab 07/26/14 0523  07/27/14 0408 07/27/14 1851 07/30/14 0505 07/31/14 0520  WBC 16.8* 19.6* 20.7* 21.1* 20.0*  HGB 10.4* 10.5* 10.5* 10.3* 10.2*  HCT 32.5* 32.3* 33.0* 31.6* 32.1*  PLT 335 357 341 352 403*  MCV 93.9 93.4 93.2 93.8 94.4  MCH 30.1 30.3 29.7 30.6 30.0  MCHC 32.0 32.5 31.8 32.6 31.8  RDW 16.7* 16.6* 16.3* 17.3* 17.5*  LYMPHSABS 10.6*  --  13.5*  --   --   MONOABS 1.2*  --  1.2*  --   --   EOSABS 1.0*  --  1.0*  --   --   BASOSABS 0.3*  --  0.4*  --   --     Chemistries   Recent Labs Lab 07/26/14 0523 07/27/14 0408 07/28/14 0350 07/30/14 0505 07/31/14 0520  NA 136 139 136 138 141  K 3.7 4.0 4.0 3.7 4.2  CL 106 106 105 107 109  CO2 GLUCOSE 114* 110* 110* 123* 110*  BUN <5* 5* <5* 7 <5*  CREATININE 0.67 0.66 0.76 0.69 0.67  CALCIUM 7.7* 8.3* 8.1* 8.1* 8.4  AST 54* 53* 54* 51* 56*  ALT 56* 48* 40* 32 32  ALKPHOS 116 116 110 102 100  BILITOT 0.4 0.3 0.6 0.2* 0.3  No results for input(s): VITAMINB12, FOLATE, FERRITIN, TIBC, IRON, RETICCTPCT in the last 72 hours.    Sumner Kirchman M.D on 08/01/2014 at 8:55 AM  Between 7am to 7pm - Pager - [phone]  After 7pm go to [email or web address] - password [facility]  [facility] Office  [phone]

## 2014-08-02 LAB — CBC
HEMATOCRIT: 32.6 % — AB (ref 36.0–46.0)
Hemoglobin: 10.3 g/dL — ABNORMAL LOW (ref 12.0–15.0)
MCH: 29.9 pg (ref 26.0–34.0)
MCHC: 31.6 g/dL (ref 30.0–36.0)
MCV: 94.8 fL (ref 78.0–100.0)
PLATELETS: 419 10*3/uL — AB (ref 150–400)
RBC: 3.44 MIL/uL — ABNORMAL LOW (ref 3.87–5.11)
RDW: 17.1 % — AB (ref 11.5–15.5)
WBC: 16.3 10*3/uL — ABNORMAL HIGH (ref 4.0–10.5)

## 2014-08-02 LAB — BASIC METABOLIC PANEL
Anion gap: 9 (ref 5–15)
BUN: 8 mg/dL (ref 6–23)
CO2: 26 mmol/L (ref 19–32)
CREATININE: 0.55 mg/dL (ref 0.50–1.10)
Calcium: 8.7 mg/dL (ref 8.4–10.5)
Chloride: 104 mmol/L (ref 96–112)
GFR calc Af Amer: >90 mL/min (ref >90)
GLUCOSE: 113 mg/dL — AB (ref 70–99)
POTASSIUM: 3.9 mmol/L (ref 3.5–5.1)
Sodium: 139 mmol/L (ref 135–145)

## 2014-08-02 MED ORDER — RIVAROXABAN (XARELTO) VTE STARTER PACK (15 & 20 MG): ORAL_TABLET | ORAL | Status: DC

## 2014-08-02 MED ORDER — VALGANCICLOVIR HCL 450 MG PO TABS: 900.0000 mg | ORAL_TABLET | Freq: Two times a day (BID) | ORAL | Status: DC

## 2014-08-02 MED ORDER — HYDROXYZINE HCL 25 MG PO TABS: 25.0000 mg | ORAL_TABLET | Freq: Three times a day (TID) | ORAL | Status: DC | PRN

## 2014-08-02 MED ORDER — OXYCODONE HCL 5 MG PO TABS: 5.0000 mg | ORAL_TABLET | ORAL | Status: DC | PRN

## 2014-08-02 NOTE — Progress Notes (Signed)
D/C instructions reviewed w/ pt and mother. Both verbalize understanding and all questions answered. Pt d/c in stable condition in w/c by NT to mother's car. Pt in possession of d/c instructions, scripts, and all personal belongings.

## 2014-08-02 NOTE — Discharge Summary (Signed)
[facility]  Physician Discharge Summary   Patient ID: Shondrika Hoque MRN: [id number] DOB/AGE: 1995/08/03 19 y.o.  Admit date: 07/19/2014 Discharge date: 08/02/2014  PCP: [name], MD  DISCHARGE DIAGNOSES:  Principal Problem:   Suppurative pylephlebitis Active Problems:   Abdominal pain   Portal vein thrombosis   Sepsis   Nausea and vomiting   History of splenectomy   Normocytic anemia   Obesity   Elevated liver enzymes   Rash   Septic thrombophlebitis   DVT (deep venous thrombosis)   Cytomegalovirus   RECOMMENDATIONS FOR OUTPATIENT FOLLOW UP: 1. Patient to follow-up with hematologist as scheduled 2. Dr. 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 On admission, patient was hemodynamically stable. She was found to have elevated LFT's which prompted abd [place] study which subsequently revealed portal vein thrombosis. CT abd confirmed this finding as well. She was started on heparin and hematology has seen the patient in consultation. Patient continues to have significant high fever, infectious disease were consulted, patient was thought to have septic portal vein thrombophlebitis, initially she was on IV vancomycin and cefepime given her history of splenectomy, antibiotic were transitioned to IV Unasyn to cover for anaerobes per ID recommendation , then transitioned to IV Cipro and Flagyl. Subsequently, CMV and EBV titers came back elevated. All antibiotics were discontinued and she was started on Valcyte on April 20. Patient is much improved. Fever appears to be  subsiding.  Consultations:  Infectious diseases  Hematology, Dr. Darnelle Catalan  Procedures: 2-D echocardiogram Study Conclusions - Left ventricle: The cavity size was normal. Wall thickness wasnormal. The estimated ejection fraction was 65%. Wall motion wasnormal; there were no regional wall motion abnormalities. - Right ventricle: The cavity size was normal. Systolic functionwas normal. - Impressions: No obvious vegetations seen. Impressions: - No obvious vegetations seen.  HOSPITAL COURSE:   Sepsis/septic portal vein thrombophlebitis/possible acute versus reactivation of CMV/diffuse skin rash - After initiation off Valcyte. Patient started improving. Her fever slowly subsided. So it appears that the fever was secondary to reactivation or acute CMV. Her skin rash is also much better.  -Blood cultures have no growth to date. Repeated April 20. Those are negative so far. - Started on [place] on April 20. She has been on multiple different antibiotics during this hospitalization including Unasyn, ciprofloxacin, Flagyl. Now remains only on Valcyte. PICC line was placed and will be discontinued prior to discharge.  - Repeat CT abdomen and pelvis showed persistent thrombus in the right portal vein and right hepatic lobe portal vein branch, and mild reactive mesenteric adenopathy, no evidence of any liver abscess. - CMV IgM and IgG both are positive, EBV IgM, IgG and nuclear antigen is positive. WBC differential showed significant elevation of lymphocytes. Findings suggests the possibility of reactivation of infection and acute mononucleosis syndrome. - UTI specimen is contaminated, no growth on urine culture. - Follow-up appointment with hematology on May 5.  Elevated liver enzymes - This is in the setting of septic portal vein thrombophlebitis, versus reactivation of infection mononucleosis syndrome. Counts have improved.  Portal vein thrombosis -  Patient started initially on heparin  drip, transitioned to Xarelto. Hematologist to follow as outpatient.  Complains of left inner thigh pain with mild erythema. -venous Doppler negative for DVT   Overall, patient has improved. She stable for discharge.   PERTINENT LABS:  The results of significant diagnostics from this hospitalization (including imaging, microbiology, ancillary and laboratory) are listed below for reference.    Microbiology: Recent Results (from the past 240 hour(s))  Culture, blood (routine x 2)     Status: None   Collection Time: 07/24/14  3:40 PM  Result Value Ref Range Status   Specimen Description BLOOD LEFT ARM  Final   Special Requests BOTTLES DRAWN AEROBIC ONLY 3CC AEROBIC BOTTLE ONLY  Final   Culture   Final    NO GROWTH 5 DAYS Performed at [facility]    Report Status 07/30/2014 FINAL  Final  Culture, blood (routine x 2)     Status: None   Collection Time: 07/24/14  4:16 PM  Result Value Ref Range Status   Specimen Description BLOOD RIGHT HAND  Final   Special Requests   Final    BOTTLES DRAWN AEROBIC AND ANAEROBIC 10CC BLUE BOTTLE, 5CC RED BOTTLE   Culture   Final    NO GROWTH 5 DAYS Performed at [facility]    Report Status 07/30/2014 FINAL  Final  Clostridium Difficile by PCR     Status: None   Collection Time: 07/26/14  8:49 AM  Result Value Ref Range Status   C difficile by pcr NEGATIVE NEGATIVE Final  Culture, blood (routine x 2)     Status: None (Preliminary result)   Collection Time: 07/28/14 12:15 PM  Result Value Ref Range Status   Specimen Description BLOOD LEFT HAND  Final   Special Requests BOTTLES DRAWN AEROBIC ONLY 5CC  Final   Culture   Final           BLOOD CULTURE RECEIVED NO GROWTH TO DATE CULTURE WILL BE HELD FOR 5 DAYS BEFORE ISSUING A FINAL NEGATIVE REPORT Performed at [facility]    Report Status PENDING  Incomplete  Culture, blood (routine x 2)     Status: None (Preliminary result)   Collection Time: 07/28/14 12:20 PM   Result Value Ref Range Status   Specimen Description BLOOD RIGHT ARM  Final   Special Requests BOTTLES DRAWN AEROBIC ONLY 1CC  Final   Culture   Final           BLOOD CULTURE RECEIVED NO GROWTH TO DATE CULTURE WILL BE HELD FOR 5 DAYS BEFORE ISSUING A FINAL NEGATIVE REPORT Performed at [facility]    Report Status PENDING  Incomplete     Labs: Basic Metabolic Panel:  Recent Labs Lab 07/27/14 0408 07/28/14 0350 07/30/14 0505 07/31/14 0520 08/02/14 0415  NA 139 136 138 141 139  K 4.0 4.0 3.7 4.2 3.9  CL 106 105 107 109 104  CO2 25 23 25 24 26   GLUCOSE 110* 110* 123* 110* 113*  BUN 5* <5* 7 <5* 8  CREATININE 0.66 0.76 0.69 0.67 0.55  CALCIUM 8.3* 8.1* 8.1* 8.4 8.7   Liver Function Tests:  Recent Labs Lab 07/27/14 0408 07/28/14 0350 07/30/14 0505 07/31/14 0520  AST 53* 54* 51* 56*  ALT 48* 40* 32 32  ALKPHOS 116 110 102 100  BILITOT 0.3 0.6 0.2* 0.3  PROT 5.9* 5.8* 5.7* 5.8*  ALBUMIN 2.4* 2.5* 2.4* 2.5*   CBC:  Recent Labs Lab 07/27/14  0408 07/27/14 1851 07/30/14 0505 07/31/14 0520 08/02/14 0415  WBC 19.6* 20.7* 21.1* 20.0* 16.3*  NEUTROABS  --  4.6  --   --   --   HGB 10.5* 10.5* 10.3* 10.2* 10.3*  HCT 32.3* 33.0* 31.6* 32.1* 32.6*  MCV 93.4 93.2 93.8 94.4 94.8  PLT 357 341 352 403* 419*    IMAGING STUDIES Dg Chest 2 View  07/27/2014   CLINICAL DATA:  Nonproductive cough for the past 2 days. Some shortness of breath and mid chest pain.  EXAM: CHEST  2 VIEW  COMPARISON:  07/20/2014 and chest CT dated 07/24/2014.  FINDINGS: Normal sized heart. Clear lungs. Left PICC tip at the superior cavoatrial junction. Thoracic spine fixation hardware and stable scoliosis.  IMPRESSION: No acute abnormality.   Electronically Signed   By: Beckie Salts M.D.   On: 07/27/2014 14:37   Ct Chest W Contrast  07/24/2014   CLINICAL DATA:  Portal vein thrombosis and fevers. Clinically, the patient has septic thrombophlebitis. Previous splenectomy related to  hypercoagulable physiology. Admitted with diffuse abdominal pain and fever as high as 103. Elevated liver function tests.  EXAM: CT CHEST, ABDOMEN, AND PELVIS WITH CONTRAST  TECHNIQUE: Multidetector CT imaging of the chest, abdomen and pelvis was performed following the standard protocol during bolus administration of intravenous contrast.  CONTRAST:  OMNIPAQUE IOHEXOL 300 MG/ML  SOLN  COMPARISON:  Abdomen and pelvis CT dated 07/19/2014.  FINDINGS: CT CHEST FINDINGS  Left subclavian catheter tip at the superior cavoatrial junction. Thoracic spine fixation hardware and scoliosis. Minimal dependent atelectasis in both lower lobes. Otherwise, clear lungs. Minimal right pleural fluid. No lung nodules or enlarged lymph nodes.  CT ABDOMEN AND PELVIS FINDINGS  Surgically absent spleen. Persistent thrombus in the distal right portal vein and right hepatic lobe portal vein branch. Poorly distended gallbladder. Normal appearing pancreas, adrenal glands, kidneys, urinary bladder, uterus and ovaries. No gastrointestinal abnormalities. Mildly enlarged lymph nodes at the root of the mesentery without significant change. The largest has a short axis diameter of 8 mm on image number 58. Mild to moderate levoconvex lumbar rotary scoliosis.  IMPRESSION: 1. Persistent thrombus in the right portal vein and right hepatic lobe portal vein branch. 2. Stable mild reactive mesenteric adenopathy. 3. Minimal right pleural effusion.   Electronically Signed   By: Beckie Salts M.D.   On: 07/24/2014 19:04   US Abdomen Complete  07/19/2014   CLINICAL DATA:  Generalized abdominal pain. Fever of 102 degrees. Nausea and vomiting.  EXAM: ULTRASOUND ABDOMEN COMPLETE  COMPARISON:  None.  FINDINGS: Gallbladder: No gallstones or wall thickening visualized. No sonographic Murphy sign noted. Gallbladder wall thickness is within normal limits at 2 mm.  Common bile duct: Diameter: 2 mm, within normal limits  Liver: Echogenic material is noted within  the right portal vein extending to the main portal vein. There is no definite edema surrounding the vessel. There is decreased flow round the material. The portal veins are otherwise patent. The hepatic artery is patent.  IVC: No abnormality visualized.  Pancreas: Visualized portion unremarkable.  Spleen: Splenectomy  Right Kidney: Length: 10.6 cm, within normal limits. Echogenicity within normal limits. No mass or hydronephrosis visualized.  Left Kidney: Length: 10.5 cm, within normal limits. Echogenicity within normal limits. No mass or hydronephrosis visualized.  Abdominal aorta: No aneurysm visualized.  Other findings: None.  IMPRESSION: 1. Echogenic material within the right portal vein with decreased flow around this material is concerning for thrombus. Given the patient's fever, three-phase  liver CT is recommended for further evaluation. 2. Normal appearance of the gallbladder. 3. Status post splenectomy   Electronically Signed   By: Marin Roberts M.D.   On: 07/19/2014 12:33   Ct Abdomen Pelvis W Contrast  07/24/2014   CLINICAL DATA:  Portal vein thrombosis and fevers. Clinically, the patient has septic thrombophlebitis. 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 Admitted with diffuse abdominal pain and fever as high as 103. Elevated liver function tests.  EXAM: CT CHEST, ABDOMEN, AND PELVIS WITH CONTRAST  TECHNIQUE: Multidetector CT imaging of the chest, abdomen and pelvis was performed following the standard protocol during bolus administration of intravenous contrast.  CONTRAST:  OMNIPAQUE IOHEXOL 300 MG/ML  SOLN  COMPARISON:  Abdomen and pelvis CT dated 07/19/2014.  FINDINGS: CT CHEST FINDINGS  Left subclavian catheter tip at the superior cavoatrial junction. Thoracic spine fixation hardware and scoliosis. Minimal dependent atelectasis in both lower lobes. Otherwise, clear lungs. Minimal right pleural fluid. No lung nodules or enlarged lymph nodes.  CT ABDOMEN AND PELVIS  FINDINGS  Surgically absent spleen. Persistent thrombus in the distal right portal vein and right hepatic lobe portal vein branch. Poorly distended gallbladder. Normal appearing pancreas, adrenal glands, kidneys, urinary bladder, uterus and ovaries. No gastrointestinal abnormalities. Mildly enlarged lymph nodes at the root of the mesentery without significant change. The largest has a short axis diameter of 8 mm on image number 58. Mild to moderate levoconvex lumbar rotary scoliosis.  IMPRESSION: 1. Persistent thrombus in the right portal vein and right hepatic lobe portal vein branch. 2. Stable mild reactive mesenteric adenopathy. 3. Minimal right pleural effusion.   Electronically Signed   By: Beckie Salts M.D.   On: 07/24/2014 19:04   Ct Abdomen Pelvis W Contrast  07/19/2014   ADDENDUM REPORT: 07/19/2014 14:45  ADDENDUM: Recommend anti coagulation and hematology oncology consultation for workup for hypercoagulable physiology. Findings conveyed toDOUGLAS DELO on 07/19/2014 at14:44.   Electronically Signed   By: Genevive Bi M.D.   On: 07/19/2014 14:45   07/19/2014   CLINICAL DATA:  Abdominal pain all over.  100 cc Omnipaque  EXAM: CT ABDOMEN AND PELVIS WITH CONTRAST  TECHNIQUE: Multidetector CT imaging of the abdomen and pelvis was performed using the standard protocol following bolus administration of intravenous contrast.  CONTRAST:  100 Omnipaque  COMPARISON:  Ultrasound 07/19/2014  FINDINGS: Lower chest: Lung bases are clear.  Hepatobiliary: On the portal venous phase, there is a filling defect within the right main pulmonary artery (image 47, series 9). There is significant streak artifact through this region from the posterior spine fusion. The main portal vein and left portal vein are patent. Likewise the superior mesenteric vein is patent. Splenic vein is patent.  There is no focal lesion within the liver parenchyma on the arterial phase imaging or portal venous phase imaging.  Pancreas: Pancreas is  normal. No ductal dilatation. No pancreatic inflammation.  Spleen: Several small lobular splenules collects within the left suprarenal location beneath the hemidiaphragm (image 31, series 4). This likely represents regenerating splenic tissue from prior splenectomy.  Adrenals/urinary tract: Adrenal glands and kidneys are normal. The ureters and bladder normal.  Stomach/Bowel: Images  Vascular/Lymphatic: Abdominal aorta is normal caliber. There is no retroperitoneal or periportal lymphadenopathy. No pelvic lymphadenopathy.  Reproductive: Uterus and ovaries are normal.  Musculoskeletal: No aggressive osseous lesion.  Other: No free fluid.  IMPRESSION: 1. Thrombus within the right portal vein. No hepatic lesion identified. This likely represents bland thrombus. 2. Main portal vein left  portal vein are patent. 3. Prior splenectomy with splenic regeneration.  Electronically Signed: By: Genevive Bi M.D. On: 07/19/2014 14:12   Dg Chest Port 1 View  07/20/2014   CLINICAL DATA:  Shortness of breath, fever, sepsis  EXAM: PORTABLE CHEST - 1 VIEW  COMPARISON:  04/21/2013  FINDINGS: Heart and mediastinal contours are within normal limits. No focal opacities or effusions. No acute bony abnormality. Posterior spinal rods in the thoracic spine with scoliosis.  IMPRESSION: No active disease.   Electronically Signed   By: Charlett Nose M.D.   On: 07/20/2014 15:27    DISCHARGE EXAMINATION: Filed Vitals:   08/01/14 0500 08/01/14 1415 08/01/14 2051 08/02/14 0415  BP: 109/63 119/78 122/72 115/67  Pulse: 74 90 113 97  Temp: 98.2 F (36.8 C) 98 F (36.7 C) 98.5 F (36.9 C) 97.8 F (36.6 C)  TempSrc: Oral Oral Oral Oral  Resp: 16 16 17 16   Height:      Weight:      SpO2: 99% 98% 96% 99%   General appearance: alert, cooperative and Rash is improving Resp: clear to auscultation bilaterally Cardio: regular rate and rhythm, S1, S2 normal, no murmur, click, rub or gallop GI: Mild vague tenderness without any rebound,  rigidity or guarding. No masses or organomegaly.  DISPOSITION: Home with mother  Discharge Instructions    Call MD for:  extreme fatigue    Complete by:  As directed      Call MD for:  persistant dizziness or light-headedness    Complete by:  As directed      Call MD for:  persistant nausea and vomiting    Complete by:  As directed      Call MD for:  severe uncontrolled pain    Complete by:  As directed      Call MD for:  temperature >100.4    Complete by:  As directed      Discharge instructions    Complete by:  As directed   Please keep your follow up appointments.  You were cared for by a hospitalist during your hospital stay. If you have any questions about your discharge medications or the care you received while you were in the hospital after you are discharged, you can call the unit and asked to speak with the hospitalist on call if the hospitalist that took care of you is not available. Once you are discharged, your primary care physician will handle any further medical issues. Please note that NO REFILLS for any discharge medications will be authorized once you are discharged, as it is imperative that you return to your primary care physician (or establish a relationship with a primary care physician if you do not have one) for your aftercare needs so that they can reassess your need for medications and monitor your lab values. If you do not have a primary care physician, you can call [phone] for a physician referral.     Increase activity slowly    Complete by:  As directed            ALLERGIES:  Allergies  Allergen Reactions  . Tape Hives    Can use paper tape     Discharge Medication List as of 08/02/2014 10:09 AM    START taking these medications   Details  oxyCODONE (OXY IR/ROXICODONE) 5 MG immediate release tablet Take 1 tablet (5 mg total) by mouth every 4 (four) hours as needed for moderate pain., Starting 08/02/2014, Until Discontinued, Print  hydrOXYzine  (ATARAX/VISTARIL) 25 MG tablet Take 1 tablet (25 mg total) by mouth 3 (three) times daily as needed for itching., Starting 08/02/2014, Until Discontinued, No Print    Rivaroxaban (XARELTO STARTER PACK) 15 & 20 MG TBPK Take as directed on package: Tonight Start with one 15mg  tablet by mouth twice a day with food and take till 08/12/14. On 08/13/14 switch to one 20mg  tablet once a day with food., No Print    valGANciclovir (VALCYTE) 450 MG tablet Take 2 tablets (900 mg total) by mouth 2 (two) times daily. For 9 more days starting tonight, Starting 08/02/2014, Until Discontinued, No Print      CONTINUE these medications which have NOT CHANGED   Details  ibuprofen (ADVIL,MOTRIN) 800 MG tablet Take 800 mg by mouth every 6 (six) hours as needed for moderate pain (pain). , Until Discontinued, Historical Med    norethindrone-ethinyl estradiol (MICROGESTIN,JUNEL,LOESTRIN) 1-20 MG-MCG tablet Take 1 tablet by mouth daily., Until Discontinued, Historical Med    promethazine (PHENERGAN) 12.5 MG tablet Take 12.5 mg by mouth every 6 (six) hours as needed for nausea or vomiting (nausea). , Until Discontinued, Historical Med       Follow-up Information    Follow up with Lowella DellMAGRINAT,GUSTAV C, MD.   Specialty:  Oncology   Why:  Appt on 08/12/14 at 2:15 PM   Contact information:   [address] [place] [address] [phone]       Follow up with Cliffton AstersJohn Campbell, MD.   Specialty:  Infectious Diseases   Why:  his office will call for follow up appointment   Contact information:   [address] [place] [address] [phone]       TOTAL DISCHARGE TIME: 35 minutes  [facility] HillsKRISHNAN,Martisha Toulouse  [facility] Pager [phone]  08/02/2014, 2:36 PM

## 2014-08-03 LAB — CULTURE, BLOOD (ROUTINE X 2)
Culture: NO GROWTH
Culture: NO GROWTH

## 2014-08-10 ENCOUNTER — Ambulatory Visit (INDEPENDENT_AMBULATORY_CARE_PROVIDER_SITE_OTHER): Payer: Medicaid Other | Admitting: Internal Medicine

## 2014-08-10 ENCOUNTER — Encounter: Payer: Self-pay | Admitting: Internal Medicine

## 2014-08-10 DIAGNOSIS — B259 Cytomegaloviral disease, unspecified: Secondary | ICD-10-CM

## 2014-08-10 LAB — COMPREHENSIVE METABOLIC PANEL
ALK PHOS: 93 U/L (ref 39–117)
ALT: 26 U/L (ref 0–35)
AST: 38 U/L — ABNORMAL HIGH (ref 0–37)
Albumin: 4 g/dL (ref 3.5–5.2)
BILIRUBIN TOTAL: 0.4 mg/dL (ref 0.2–1.1)
BUN: 11 mg/dL (ref 6–23)
CO2: 20 mEq/L (ref 19–32)
CREATININE: 0.63 mg/dL (ref 0.50–1.10)
Calcium: 9.5 mg/dL (ref 8.4–10.5)
Chloride: 106 mEq/L (ref 96–112)
GLUCOSE: 99 mg/dL (ref 70–99)
POTASSIUM: 4.3 meq/L (ref 3.5–5.3)
SODIUM: 138 meq/L (ref 135–145)
Total Protein: 7.4 g/dL (ref 6.0–8.3)

## 2014-08-10 NOTE — Progress Notes (Signed)
Patient ID: Carol [name], female   DOB: [birth year], 19 y.o.   MRN: [id number]         [facility]  Patient Active Problem List   Diagnosis Date Noted  . Suppurative pylephlebitis 07/21/2014    Priority: High  . Elevated liver enzymes 07/21/2014    Priority: High  . Sepsis 07/20/2014    Priority: High  . Nausea and vomiting 07/20/2014    Priority: High  . Abdominal pain 07/19/2014    Priority: High  . Portal vein thrombosis 07/19/2014    Priority: High  . Rash 07/21/2014    Priority: Medium  . Cytomegalovirus   . DVT (deep venous thrombosis)   . Septic thrombophlebitis   . 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 Normocytic anemia 07/21/2014  . Obesity 07/21/2014  . Leukocytosis 07/20/2014  . Blood poisoning   . UTI (urinary tract infection) 07/19/2014    Patient's Medications  New Prescriptions   No medications on file  Previous Medications   HYDROXYZINE (ATARAX/VISTARIL) 25 MG TABLET    Take 1 tablet (25 mg total) by mouth 3 (three) times daily as needed for itching.   IBUPROFEN (ADVIL,MOTRIN) 800 MG TABLET    Take 800 mg by mouth every 6 (six) hours as needed for moderate pain (pain).    NORETHINDRONE-ETHINYL ESTRADIOL (MICROGESTIN,JUNEL,LOESTRIN) 1-20 MG-MCG TABLET    Take 1 tablet by mouth daily.   OXYCODONE (OXY IR/ROXICODONE) 5 MG IMMEDIATE RELEASE TABLET    Take 1 tablet (5 mg total) by mouth every 4 (four) hours as needed for moderate pain.   PROMETHAZINE (PHENERGAN) 12.5 MG TABLET    Take 12.5 mg by mouth every 6 (six) hours as needed for nausea or vomiting (nausea).    RIVAROXABAN (XARELTO STARTER PACK) 15 & 20 MG TBPK    Take as directed on package: Tonight Start with one  tablet by mouth twice a day with food and take till 08/12/14. On 08/13/14 switch to one  tablet once a day with food.  Modified Medications   No medications on file  Discontinued Medications   VALGANCICLOVIR (VALCYTE) 450 MG TABLET    Take 2 tablets (900 mg  total) by mouth 2 (two) times daily. For 9 more days starting tonight    Subjective: Carol [name] is in for her hospital follow-up visit. 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 An abdominal CT scan revealed what appeared to be an acute thrombus in the right portal vein. She had high spiking fevers. Initially I treated her for probable septic thrombophlebitis with empiric IV antibiotics. She was also anticoagulated. She continued to have high spiking fevers and developed leukocytosis with lymphocytosis and atypical lymphocytes. Her hepatic transaminases were also elevated. Cytomegalovirus IgM and Epstein-Barr virus IgM antibodies were both elevated. Her cytomegalovirus DNA PCR was also positive. Because of persistent fevers and a new pruritic rash on empiric antibiotics I stopped the antibiotics and started her on valganciclovir. She defervesced and started to improve. She was discharged home and completed 14 days of therapy yesterday. She is feeling much better. She still has some periods where she feels hot and cold but she has not had any fever. Her abdominal pain is improving. She still has intermittent nausea but has had only one bout of vomiting just after discharge. Her rash has resolved.  Review of Systems: Pertinent items are noted  in HPI.  No past medical history on file.  History  Substance Use Topics  . Smoking status: Never Smoker   . Smokeless tobacco: Not on file  . Alcohol Use: No    No family history on file.  Allergies  Allergen Reactions  . Tape Hives    Can use paper tape    Objective: Temp: 98.4 F (36.9 C) (05/03 1059) Temp Source: Oral (05/03 1059) BP: 127/85 mmHg (05/03 1059) Pulse Rate: 60 (05/03 1059)  General: She is smiling and in good spirits Skin: Very faint, residual erythema over her forearms Lungs: Clear Cor:  Regular S1 and S2 with no murmurs Abdomen: Soft and nontender with no palpable masses   Lab Results Lab Results  Component Value Date   WBC 16.3* 08/02/2014   HGB 10.3* 08/02/2014   HCT 32.6* 08/02/2014   MCV 94.8 08/02/2014   PLT 419* 08/02/2014   CMP     Component Value Date/Time   NA 139 08/02/2014 0415   K 3.9 08/02/2014 0415   CL 104 08/02/2014 0415   CO2 26 08/02/2014 0415   GLUCOSE 113* 08/02/2014 0415   BUN 8 08/02/2014 0415   CREATININE 0.55 08/02/2014 0415   CALCIUM 8.7 08/02/2014 0415   PROT 5.8* 07/31/2014 0520   ALBUMIN 2.5* 07/31/2014 0520   AST 56* 07/31/2014 0520   ALT 32 07/31/2014 0520   ALKPHOS 100 07/31/2014 0520   BILITOT 0.3 07/31/2014 0520   GFRNONAA >90 08/02/2014 0415   GFRAA >90 08/02/2014 0415   Cytomegalovirus DNA PCR 07/28/2014: 6930 international units per mL   Assessment: Her acute/reactivation cytomegalovirus mononucleosis is resolving. The past 10 years there have been numerous case reports in case series documenting that cytomegalovirus infection can be associated with acute venous thromboembolism. The exact mechanism is unclear.  Plan: 1. Repeat CBC with differential and complete metabolic panel. I will call her with the results. 2. Observe off of valganciclovir 3. Continue rivaroxaban. She has follow-up with Dr. Marikay AlarGus Magrinat.   Cliffton AstersJohn Antero Derosia, MD [facility] [phone] pager   [phone] cell 08/10/2014, 11:22 AM

## 2014-08-11 ENCOUNTER — Telehealth: Payer: Self-pay | Admitting: Internal Medicine

## 2014-08-11 LAB — CBC WITH DIFFERENTIAL/PLATELET
BASOS ABS: 0.1 10*3/uL (ref 0.0–0.1)
Basophils Relative: 1 % (ref 0–1)
Eosinophils Absolute: 0.3 10*3/uL (ref 0.0–0.7)
Eosinophils Relative: 3 % (ref 0–5)
HEMATOCRIT: 35.1 % — AB (ref 36.0–46.0)
Hemoglobin: 11.3 g/dL — ABNORMAL LOW (ref 12.0–15.0)
Lymphocytes Relative: 69 % — ABNORMAL HIGH (ref 12–46)
Lymphs Abs: 7.7 10*3/uL — ABNORMAL HIGH (ref 0.7–4.0)
MCH: 29.9 pg (ref 26.0–34.0)
MCHC: 32.2 g/dL (ref 30.0–36.0)
MCV: 92.9 fL (ref 78.0–100.0)
MPV: 10.1 fL (ref 8.6–12.4)
Monocytes Absolute: 0.2 10*3/uL (ref 0.1–1.0)
Monocytes Relative: 2 % — ABNORMAL LOW (ref 3–12)
NEUTROS ABS: 2.8 10*3/uL (ref 1.7–7.7)
Neutrophils Relative %: 25 % — ABNORMAL LOW (ref 43–77)
PLATELETS: 602 10*3/uL — AB (ref 150–400)
RBC: 3.78 MIL/uL — ABNORMAL LOW (ref 3.87–5.11)
RDW: 16.2 % — ABNORMAL HIGH (ref 11.5–15.5)
WBC: 11.2 10*3/uL — AB (ref 4.0–10.5)

## 2014-08-11 LAB — PATHOLOGIST SMEAR REVIEW

## 2014-08-12 ENCOUNTER — Encounter: Payer: Self-pay | Admitting: Nurse Practitioner

## 2014-08-12 ENCOUNTER — Ambulatory Visit: Payer: Medicaid Other

## 2014-08-12 ENCOUNTER — Encounter: Payer: Self-pay | Admitting: Oncology

## 2014-08-12 ENCOUNTER — Telehealth: Payer: Self-pay | Admitting: Oncology

## 2014-08-12 ENCOUNTER — Ambulatory Visit (HOSPITAL_BASED_OUTPATIENT_CLINIC_OR_DEPARTMENT_OTHER): Payer: Medicaid Other | Admitting: Nurse Practitioner

## 2014-08-12 DIAGNOSIS — Z9081 Acquired absence of spleen: Secondary | ICD-10-CM

## 2014-08-12 DIAGNOSIS — I81 Portal vein thrombosis: Secondary | ICD-10-CM | POA: Diagnosis not present

## 2014-08-12 DIAGNOSIS — Z7901 Long term (current) use of anticoagulants: Secondary | ICD-10-CM

## 2014-08-12 MED ORDER — RIVAROXABAN 20 MG PO TABS: 20.0000 mg | ORAL_TABLET | Freq: Every day | ORAL | Status: DC

## 2014-08-12 NOTE — Telephone Encounter (Signed)
I spoke with Carol [name] yesterday and let her know that her blood work continues to improve. This along with her clinical improvement suggests that her CMV mononucleosis is resolving. She will continue on rivaroxaban and follow-up with Dr. Darnelle CatalanMagrinat.

## 2014-08-12 NOTE — Progress Notes (Signed)
Checked in new pt with no financial concerns at this time.  Pt's ins should pay at 100% but she has my card for any billing questions or concerns. ° °

## 2014-08-12 NOTE — Telephone Encounter (Signed)
Gave avs & calendar for August. Also gave CT contrast for scan. °

## 2014-08-12 NOTE — Progress Notes (Signed)
ID: Carol [name] OB: May 04, 1995  MR#: [id number]  CSN#:[id number]  PCP: Kristine Garbe, MD GYN:   SU:  OTHER MD:  CHIEF COMPLAINT:Right portal vein clot  CURRENT THERAPY: xarelto  HISTORY OF PRESENT ILLNESS:  "Carol [name]" developed nausea and abdominal discomfort 07/09/2014. She felt better the next 2 days, then on 07/12/2014 again experienced vomiting, and now also temperatures up to 103 degrees. She saw her PCP 07/14/2014 and was started on prochlorperazine, with little effect. She was also on ibuprofen for the abdominal pain. The fevers persisted and this AM took her to the ED where a urinalysis was obtained, showing mod leukocytes. An abd US showed a normal GB but echogenic material in the R porta; v suggestive of thrombus. This was confirmed with CT of the abd and pelvis showing a filling defect in the R portal v. There were no other abnormalities other than the absence of a spleen--the pt being s/p traumatic splenectomy age 29. The patient was then transferred to [facility] for further evaluation   INTERVAL HISTORY:  Carol [name] returns today for follow up of the thrombus to her right portal vein. She was started on xarelto in the hospital and continued the 43m BID dosing as advised for 21 day. She is here to discuss moving to 212mdaily dosing. The interval history is generally unremarkable. She has had no abnormal bleeding or bruising. She continues to have some mild abdominal pain, rated 4/10. She typically takes ibuprofen for this, and avoids her narcotics as best she can. She has phenergan PRN nausea, but has not had to use this. She is somewhat constipated but plans to start stool softeners.  REVIEW OF SYSTEMS: A detailed review of systems is otherwise entirely negative, except where noted above.   PAST MEDICAL HISTORY: No past medical history on file.  PAST SURGICAL HISTORY: Past Surgical History  Procedure Laterality Date  . Back surgery    . Splenectomy, total     GYNECOLOGIC HISTORY:   Patient's last menstrual period was 12/18/2013. GX P0. Was on orac contraceptives until this admission, started September 2015  FAMILY HISTORY No family history on file. The patient's maternal grandfather had a LE-DVT in his 5056'safter a fall. He died of an MI age 2361The aptient's maternal grandmother is alove, age 426s/p multiple strokes (not a smoker). The patient's mother has no Hx of DVT. The patient knows little about her biological father. The patient has one brother and one sister, in good health  SOCIAL HISTORY:  Carol [name] lives with her mother JoShawnee Knappnd her stepfatehr MiHarless LittenJoJeral Fruits a haTheme park managernd MiRonalee Beltsorks for Ci[employer]-PalmoliveAbby is going to cosmetology school.    ADVANCED DIRECTIVES: not in place   HEALTH MAINTENANCE: History  Substance Use Topics  . Smoking status: Never Smoker   . Smokeless tobacco: Not on file  . Alcohol Use: No     Allergies  Allergen Reactions  . Tape Hives    Can use paper tape    Current Outpatient Prescriptions  Medication Sig Dispense Refill  . hydrOXYzine (ATARAX/VISTARIL) 25 MG tablet Take 1 tablet (25 mg total) by mouth 3 (three) times daily as needed for itching. 30 tablet 0  . ibuprofen (ADVIL,MOTRIN) 800 MG tablet Take 800 mg by mouth every 6 (six) hours as needed for moderate pain (pain).     . norethindrone-ethinyl estradiol (MICROGESTIN,JUNEL,LOESTRIN) 1-20 MG-MCG tablet Take 1 tablet by mouth daily.    . Marland KitchenxyCODONE (OXY IR/ROXICODONE) 5 MG immediate release tablet Take  1 tablet (5 mg total) by mouth every 4 (four) hours as needed for moderate pain. 15 tablet 0  . promethazine (PHENERGAN) 12.5 MG tablet Take 12.5 mg by mouth every 6 (six) hours as needed for nausea or vomiting (nausea).     . rivaroxaban (XARELTO) 20 MG TABS tablet Take 1 tablet (20 mg total) by mouth daily with supper. 30 tablet 3   No current facility-administered medications for this visit.    OBJECTIVE: young White Female Filed Vitals:   08/12/14  1410  BP: 135/86  Pulse: 74  Temp: 98.3 F (36.8 C)  Resp: 18     Body mass index is 34.78 kg/(m^2).   ECOG FS:1 - Symptomatic but completely ambulatory  Skin: warm, dry  HEENT: sclerae anicteric, conjunctivae pink, oropharynx clear. No thrush or mucositis.  Lymph Nodes: No cervical or supraclavicular lymphadenopathy  Lungs: clear to auscultation bilaterally, no rales, wheezes, or rhonci  Heart: regular rate and rhythm  Abdomen: round, soft, minimally tender, positive bowel sounds  Musculoskeletal: No focal spinal tenderness, no peripheral edema  Neuro: non focal, well oriented, positive affect  Breasts: deferred  LAB RESULTS: Office Visit on 08/10/2014  Component Date Value Ref Range Status  . WBC 08/10/2014 11.2* 4.0 - 10.5 K/uL Final  . RBC 08/10/2014 3.78* 3.87 - 5.11 MIL/uL Final  . Hemoglobin 08/10/2014 11.3* 12.0 - 15.0 g/dL Final  . HCT 08/10/2014 35.1* 36.0 - 46.0 % Final  . MCV 08/10/2014 92.9  78.0 - 100.0 fL Final  . MCH 08/10/2014 29.9  26.0 - 34.0 pg Final  . MCHC 08/10/2014 32.2  30.0 - 36.0 g/dL Final  . RDW 08/10/2014 16.2* 11.5 - 15.5 % Final  . Platelets 08/10/2014 602* 150 - 400 K/uL Final  . MPV 08/10/2014 10.1  8.6 - 12.4 fL Final  . Neutrophils Relative % 08/10/2014 25* 43 - 77 % Final  . Neutro Abs 08/10/2014 2.8  1.7 - 7.7 K/uL Final  . Lymphocytes Relative 08/10/2014 69* 12 - 46 % Final  . Lymphs Abs 08/10/2014 7.7* 0.7 - 4.0 K/uL Final  . Monocytes Relative 08/10/2014 2* 3 - 12 % Final  . Monocytes Absolute 08/10/2014 0.2  0.1 - 1.0 K/uL Final  . Eosinophils Relative 08/10/2014 3  0 - 5 % Final  . Eosinophils Absolute 08/10/2014 0.3  0.0 - 0.7 K/uL Final  . Basophils Relative 08/10/2014 1  0 - 1 % Final  . Basophils Absolute 08/10/2014 0.1  0.0 - 0.1 K/uL Final  . Smear Review 08/10/2014     Final   Pending path review  . Sodium 08/10/2014 138  135 - 145 mEq/L Final  . Potassium 08/10/2014 4.3  3.5 - 5.3 mEq/L Final  . Chloride 08/10/2014 106   96 - 112 mEq/L Final  . CO2 08/10/2014 20  19 - 32 mEq/L Final  . Glucose, Bld 08/10/2014 99  70 - 99 mg/dL Final  . BUN 08/10/2014 11  6 - 23 mg/dL Final  . Creat 08/10/2014 0.63  0.50 - 1.10 mg/dL Final  . Total Bilirubin 08/10/2014 0.4  0.2 - 1.1 mg/dL Final  . Alkaline Phosphatase 08/10/2014 93  39 - 117 U/L Final  . AST 08/10/2014 38* 0 - 37 U/L Final  . ALT 08/10/2014 26  0 - 35 U/L Final  . Total Protein 08/10/2014 7.4  6.0 - 8.3 g/dL Final  . Albumin 08/10/2014 4.0  3.5 - 5.2 g/dL Final  . Calcium 08/10/2014 9.5  8.4 - 10.5  mg/dL Final  . Path Review 08/10/2014 SEE NOTE   Final   Comment: Absolute lymphocytosis. Favor reactive process, recommend clinical correlation. Normocytic anemia with polychromasia and anisocytosis.   The overall findings in this smear are consistent with a reactive thrombocytosis.  Clinical correlation is recommended. Reviewed by Francis Gaines Mammarappallil MD (Electronic Signature on File) 08/11/2014   Admission on 07/19/2014, Discharged on 08/02/2014  No results displayed because visit has over 200 results.      Urinalysis    Component Value Date/Time   COLORURINE AMBER* 07/19/2014 0945   APPEARANCEUR CLOUDY* 07/19/2014 0945   LABSPEC 1.030 07/19/2014 0945   PHURINE 6.0 07/19/2014 0945   GLUCOSEU NEGATIVE 07/19/2014 0945   HGBUR NEGATIVE 07/19/2014 0945   BILIRUBINUR SMALL* 07/19/2014 0945   KETONESUR 15* 07/19/2014 0945   PROTEINUR NEGATIVE 07/19/2014 0945   UROBILINOGEN 1.0 07/19/2014 0945   NITRITE NEGATIVE 07/19/2014 0945   LEUKOCYTESUR MODERATE* 07/19/2014 0945    STUDIES: Dg Chest 2 View  07/27/2014   CLINICAL DATA:  Nonproductive cough for the past 2 days. Some shortness of breath and mid chest pain.  EXAM: CHEST  2 VIEW  COMPARISON:  07/20/2014 and chest CT dated 07/24/2014.  FINDINGS: Normal sized heart. Clear lungs. Left PICC tip at the superior cavoatrial junction. Thoracic spine fixation hardware and stable scoliosis.  IMPRESSION: No  acute abnormality.   Electronically Signed   By: Claudie Revering M.D.   On: 07/27/2014 14:37   Ct Chest W Contrast  07/24/2014   CLINICAL DATA:  Portal vein thrombosis and fevers. Clinically, the patient has septic thrombophlebitis. 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 Admitted with diffuse abdominal pain and fever as high as 103. Elevated liver function tests.  EXAM: CT CHEST, ABDOMEN, AND PELVIS WITH CONTRAST  TECHNIQUE: Multidetector CT imaging of the chest, abdomen and pelvis was performed following the standard protocol during bolus administration of intravenous contrast.  CONTRAST:  175m OMNIPAQUE IOHEXOL 300 MG/ML  SOLN  COMPARISON:  Abdomen and pelvis CT dated 07/19/2014.  FINDINGS: CT CHEST FINDINGS  Left subclavian catheter tip at the superior cavoatrial junction. Thoracic spine fixation hardware and scoliosis. Minimal dependent atelectasis in both lower lobes. Otherwise, clear lungs. Minimal right pleural fluid. No lung nodules or enlarged lymph nodes.  CT ABDOMEN AND PELVIS FINDINGS  Surgically absent spleen. Persistent thrombus in the distal right portal vein and right hepatic lobe portal vein branch. Poorly distended gallbladder. Normal appearing pancreas, adrenal glands, kidneys, urinary bladder, uterus and ovaries. No gastrointestinal abnormalities. Mildly enlarged lymph nodes at the root of the mesentery without significant change. The largest has a short axis diameter of 8 mm on image number 58. Mild to moderate levoconvex lumbar rotary scoliosis.  IMPRESSION: 1. Persistent thrombus in the right portal vein and right hepatic lobe portal vein branch. 2. Stable mild reactive mesenteric adenopathy. 3. Minimal right pleural effusion.   Electronically Signed   By: SClaudie ReveringM.D.   On: 07/24/2014 19:04   [place]oreaAbdomen Complete  07/19/2014   CLINICAL DATA:  Generalized abdominal pain. Fever of 102 degrees. Nausea and vomiting.  EXAM: ULTRASOUND ABDOMEN COMPLETE  COMPARISON:   None.  FINDINGS: Gallbladder: No gallstones or wall thickening visualized. No sonographic Murphy sign noted. Gallbladder wall thickness is within normal limits at 2 mm.  Common bile duct: Diameter: 2 mm, within normal limits  Liver: Echogenic material is noted within the right portal vein extending to the main portal vein. There is no definite edema surrounding the vessel. There  is decreased flow round the material. The portal veins are otherwise patent. The hepatic artery is patent.  IVC: No abnormality visualized.  Pancreas: Visualized portion unremarkable.  Spleen: Splenectomy  Right Kidney: Length: 10.6 cm, within normal limits. Echogenicity within normal limits. No mass or hydronephrosis visualized.  Left Kidney: Length: 10.5 cm, within normal limits. Echogenicity within normal limits. No mass or hydronephrosis visualized.  Abdominal aorta: No aneurysm visualized.  Other findings: None.  IMPRESSION: 1. Echogenic material within the right portal vein with decreased flow around this material is concerning for thrombus. Given the patient's fever, three-phase liver CT is recommended for further evaluation. 2. Normal appearance of the gallbladder. 3. Status post splenectomy   Electronically Signed   By: San Morelle M.D.   On: 07/19/2014 12:33   Ct Abdomen Pelvis W Contrast  07/24/2014   CLINICAL DATA:  Portal vein thrombosis and fevers. Clinically, the patient has septic thrombophlebitis. 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 Admitted with diffuse abdominal pain and fever as high as 103. Elevated liver function tests.  EXAM: CT CHEST, ABDOMEN, AND PELVIS WITH CONTRAST  TECHNIQUE: Multidetector CT imaging of the chest, abdomen and pelvis was performed following the standard protocol during bolus administration of intravenous contrast.  CONTRAST:  143m OMNIPAQUE IOHEXOL 300 MG/ML  SOLN  COMPARISON:  Abdomen and pelvis CT dated 07/19/2014.  FINDINGS: CT CHEST FINDINGS  Left subclavian  catheter tip at the superior cavoatrial junction. Thoracic spine fixation hardware and scoliosis. Minimal dependent atelectasis in both lower lobes. Otherwise, clear lungs. Minimal right pleural fluid. No lung nodules or enlarged lymph nodes.  CT ABDOMEN AND PELVIS FINDINGS  Surgically absent spleen. Persistent thrombus in the distal right portal vein and right hepatic lobe portal vein branch. Poorly distended gallbladder. Normal appearing pancreas, adrenal glands, kidneys, urinary bladder, uterus and ovaries. No gastrointestinal abnormalities. Mildly enlarged lymph nodes at the root of the mesentery without significant change. The largest has a short axis diameter of 8 mm on image number 58. Mild to moderate levoconvex lumbar rotary scoliosis.  IMPRESSION: 1. Persistent thrombus in the right portal vein and right hepatic lobe portal vein branch. 2. Stable mild reactive mesenteric adenopathy. 3. Minimal right pleural effusion.   Electronically Signed   By: SClaudie ReveringM.D.   On: 07/24/2014 19:04   Ct Abdomen Pelvis W Contrast  07/19/2014   ADDENDUM REPORT: 07/19/2014 14:45  ADDENDUM: Recommend anti coagulation and hematology oncology consultation for workup for hypercoagulable physiology. Findings conveyed toDOUGLAS DELO on 07/19/2014 at14:44.   Electronically Signed   By: SSuzy BouchardM.D.   On: 07/19/2014 14:45   07/19/2014   CLINICAL DATA:  Abdominal pain all over.  100 cc Omnipaque  EXAM: CT ABDOMEN AND PELVIS WITH CONTRAST  TECHNIQUE: Multidetector CT imaging of the abdomen and pelvis was performed using the standard protocol following bolus administration of intravenous contrast.  CONTRAST:  100 Omnipaque  COMPARISON:  Ultrasound 07/19/2014  FINDINGS: Lower chest: Lung bases are clear.  Hepatobiliary: On the portal venous phase, there is a filling defect within the right main pulmonary artery (image 47, series 9). There is significant streak artifact through this region from the posterior spine fusion.  The main portal vein and left portal vein are patent. Likewise the superior mesenteric vein is patent. Splenic vein is patent.  There is no focal lesion within the liver parenchyma on the arterial phase imaging or portal venous phase imaging.  Pancreas: Pancreas is normal. No ductal dilatation. No pancreatic inflammation.  Spleen:  Several small lobular splenules collects within the left suprarenal location beneath the hemidiaphragm (image 31, series 4). This likely represents regenerating splenic tissue from prior splenectomy.  Adrenals/urinary tract: Adrenal glands and kidneys are normal. The ureters and bladder normal.  Stomach/Bowel: Images  Vascular/Lymphatic: Abdominal aorta is normal caliber. There is no retroperitoneal or periportal lymphadenopathy. No pelvic lymphadenopathy.  Reproductive: Uterus and ovaries are normal.  Musculoskeletal: No aggressive osseous lesion.  Other: No free fluid.  IMPRESSION: 1. Thrombus within the right portal vein. No hepatic lesion identified. This likely represents bland thrombus. 2. Main portal vein left portal vein are patent. 3. Prior splenectomy with splenic regeneration.  Electronically Signed: By: Suzy Bouchard M.D. On: 07/19/2014 14:12   Dg Chest Port 1 View  07/20/2014   CLINICAL DATA:  Shortness of breath, fever, sepsis  EXAM: PORTABLE CHEST - 1 VIEW  COMPARISON:  04/21/2013  FINDINGS: Heart and mediastinal contours are within normal limits. No focal opacities or effusions. No acute bony abnormality. Posterior spinal rods in the thoracic spine with scoliosis.  IMPRESSION: No active disease.   Electronically Signed   By: Rolm Baptise M.D.   On: 07/20/2014 15:27    ASSESSMENT: 19 y.o. 19 y.o. s/p remote traumatic splenectomy age 20, now with Right portal v thrombus found in course of evaluation for a febrile illness accompanied by nausea, vomiting and abdominal pain  1) treat as acute clot, with 6 months of anticoagulation planned. Started xarelto 07/23/14.    (2) splenectomized patiens are at risk of fulminant infections with encapsulated organisms: patient tells me she had her last "triple vaccine" in 2013 years ago, which is adequate  (3) advised the patient to avoid oral contraceptives and consider IUDs or other contraceptive methods; she will need gynecology referral to optimize this  PLAN: Carol [name] is doing well today. She will complete her 21 days of 64m xarelto BID, and will begin 296mdaily tomorrow. I have sent the prescription for this new dose tomorrow, but she has a week's worth already packaged in her starter xarelto kit. She will continue to monitor for bleeding and excessive bruising. She asked about piercing and tattoos, but she plans to wait until her anticoagulation therapy is complete to pursue these ideas.   Carol [name] will return in 3 months for a follow up visit with Dr. MaJana HakimPrior to this visit she will have a repeat abdominal CT to reassess the right portal vein. She understands and agrees with this plan. She has been encouraged to call with any issues that might arise before her next visit here.                          Total time spent with patient was 30 minutes, with greater than 50% of the time spent face-to-face with the patient.   HeLaurie PandaNP  08/12/2014 3:36 PM

## 2014-11-16 ENCOUNTER — Ambulatory Visit (HOSPITAL_COMMUNITY): Payer: Medicaid Other

## 2014-11-24 ENCOUNTER — Encounter (HOSPITAL_COMMUNITY): Payer: Self-pay

## 2014-11-24 ENCOUNTER — Ambulatory Visit (HOSPITAL_COMMUNITY)
Admission: RE | Admit: 2014-11-24 | Discharge: 2014-11-24 | Disposition: A | Discharge status: Home / Self Care | Payer: Medicaid Other | Source: Ambulatory Visit | Attending: Nurse Practitioner | Admitting: Nurse Practitioner

## 2014-11-24 DIAGNOSIS — I81 Portal vein thrombosis: Secondary | ICD-10-CM

## 2014-11-24 DIAGNOSIS — M4185 Other forms of scoliosis, thoracolumbar region: Secondary | ICD-10-CM | POA: Diagnosis not present

## 2014-11-24 MED ORDER — IOHEXOL 300 MG/ML  SOLN
50.0000 mL | Freq: Once | INTRAMUSCULAR | Status: AC | PRN
  Administered 2014-11-24: 50 mL via ORAL

## 2014-11-24 MED ORDER — IOHEXOL 300 MG/ML  SOLN
100.0000 mL | Freq: Once | INTRAMUSCULAR | Status: AC | PRN
  Administered 2014-11-24: 100 mL via INTRAVENOUS

## 2014-11-25 ENCOUNTER — Telehealth: Payer: Self-pay | Admitting: Oncology

## 2014-11-25 ENCOUNTER — Ambulatory Visit (HOSPITAL_BASED_OUTPATIENT_CLINIC_OR_DEPARTMENT_OTHER): Payer: Medicaid Other | Admitting: Oncology

## 2014-11-25 VITALS — BP 116/60 | HR 65 | Temp 98.3°F | Resp 18 | Ht 59.0 in | Wt 171.7 lb

## 2014-11-25 DIAGNOSIS — Z86718 Personal history of other venous thrombosis and embolism: Secondary | ICD-10-CM | POA: Diagnosis present

## 2014-11-25 DIAGNOSIS — Z7901 Long term (current) use of anticoagulants: Secondary | ICD-10-CM

## 2014-11-25 DIAGNOSIS — Z9081 Acquired absence of spleen: Secondary | ICD-10-CM | POA: Diagnosis not present

## 2014-11-25 DIAGNOSIS — I82409 Acute embolism and thrombosis of unspecified deep veins of unspecified lower extremity: Secondary | ICD-10-CM

## 2014-11-25 DIAGNOSIS — B2710 Cytomegaloviral mononucleosis without complications: Secondary | ICD-10-CM

## 2014-11-25 NOTE — Telephone Encounter (Signed)
Appointments made and avs printed and mailed to patient °

## 2014-11-25 NOTE — Progress Notes (Signed)
ID: Cordelia Poche OB: 1995-06-03  MR#: [id number]  CSN#:[id number]  PCP: [name], MD GYN:   SU:  OTHER MD: Cliffton Asters M.D.  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 She felt better the next 2 days, then on 07/12/2014 again experienced vomiting, and now also temperatures up to 103 degrees. She saw her PCP 07/14/2014 and was started on prochlorperazine, with little effect. She was also on ibuprofen for the abdominal pain. The fevers persisted and this AM took her to the ED where a urinalysis was obtained, showing mod leukocytes. An abd US showed a normal GB but echogenic material in the R porta; v suggestive of thrombus. This was confirmed with CT of the abd and pelvis showing a filling defect in the R portal v. There were no other abnormalities other than the absence of a spleen--the pt being s/p traumatic splenectomy age 50. The patient was then transferred to [facility] for further evaluation. Extensive workup eventually revealed a high CMV IgM titer and CMV DNA quantitative reading. The patient was treated by ID with valganciclovir, with significant improvement in her symptoms. The association between cytomegalovirus infection and acute venous thromboembolism is poorly understood but has been amply reported (referred to Dr. 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 She has been on Rivaroxaban since April. She has had no bleeding complications so far. She obtains the drug at no cost.  REVIEW OF SYSTEMS: [name] tells me there is a lot of "drama " in her life right now, related partly to boyfriend issues and partly to her job. She does enjoy her work however. She still has some abdominal cramps at times  but this is very minimal. There is been no change in bowel or bladder habits. A detailed review of systems today was otherwise negative  PAST MEDICAL HISTORY: No past medical history on file.  PAST SURGICAL HISTORY: Past Surgical History  Procedure Laterality Date  . Back surgery    . Splenectomy, total     GYNECOLOGIC HISTORY:  Patient's last menstrual period was 12/18/2013. GX P0. Was on orac contraceptives until this admission, started September 2015  FAMILY HISTORY No family history on file. The patient's maternal grandfather had a LE-DVT in his 62's, after a fall. He died of an MI age 50. The aptient's maternal grandmother is alove, age 24, s/p multiple strokes (not a smoker). The patient's mother has no Hx of DVT. The patient knows little about her biological father. The patient has one brother and one sister, in good health  SOCIAL HISTORY:  [name] lives with her mother Lavetta Nielsen and her stepfatehr Lisabeth Register. 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 [name] is going to cosmetology school.    ADVANCED DIRECTIVES: not in place   HEALTH MAINTENANCE: Social History  Substance Use Topics  . Smoking status: Never Smoker   . Smokeless tobacco: Not on file  . Alcohol Use: No     Allergies  Allergen Reactions  . Tape Hives    Can use paper tape    Current Outpatient Prescriptions  Medication Sig Dispense Refill  . hydrOXYzine (ATARAX/VISTARIL) 25 MG tablet Take 1 tablet (25 mg total) by mouth 3 (three) times daily as needed for itching. 30 tablet 0  .  ibuprofen (ADVIL,MOTRIN) 800 MG tablet Take 800 mg by mouth every 6 (six) hours as needed for moderate pain (pain).     . norethindrone-ethinyl estradiol (MICROGESTIN,JUNEL,LOESTRIN) 1-20 MG-MCG tablet Take 1 tablet by mouth daily.    Marland Kitchen oxyCODONE (OXY IR/ROXICODONE) 5 MG immediate release tablet Take 1 tablet (5 mg total) by mouth every 4 (four) hours as needed for moderate pain. 15 tablet 0  . promethazine  (PHENERGAN) 12.5 MG tablet Take 12.5 mg by mouth every 6 (six) hours as needed for nausea or vomiting (nausea).     . rivaroxaban (XARELTO) 20 MG TABS tablet Take 1 tablet (20 mg total) by mouth daily with supper. 30 tablet 3   No current facility-administered medications for this visit.    OBJECTIVE: young White woman in no acute distress Filed Vitals:   11/25/14 0912  BP: 116/60  Pulse: 65  Temp: 98.3 F (36.8 C)  Resp: 18     Body mass index is 34.66 kg/(m^2).   ECOG FS:0 - Asymptomatic  Sclerae unicteric, pupils round and equal Oropharynx clear and moist-- no thrush or other lesions No cervical or supraclavicular adenopathy Lungs no rales or rhonchi Heart regular rate and rhythm Abd soft, nontender, positive bowel sounds, no masses palpated  MSK no focal spinal tenderness, no upper extremity lymphedema Neuro: nonfocal, well oriented, appropriate affect Breasts: Deferred  LAB RESULTS: No visits with results within 1 Month(s) from this visit. Latest known visit with results is:  Office Visit on 08/10/2014  Component Date Value Ref Range Status  . WBC 08/10/2014 11.2* 4.0 - 10.5 K/uL Final  . RBC 08/10/2014 3.78* 3.87 - 5.11 MIL/uL Final  . Hemoglobin 08/10/2014 11.3* 12.0 - 15.0 g/dL Final  . HCT 16/01/9603 35.1* 36.0 - 46.0 % Final  . MCV 08/10/2014 92.9  78.0 - 100.0 fL Final  . MCH 08/10/2014 29.9  26.0 - 34.0 pg Final  . MCHC 08/10/2014 32.2  30.0 - 36.0 g/dL Final  . RDW 54/12/8117 16.2* 11.5 - 15.5 % Final  . Platelets 08/10/2014 602* 150 - 400 K/uL Final  . MPV 08/10/2014 10.1  8.6 - 12.4 fL Final  . Neutrophils Relative % 08/10/2014 25* 43 - 77 % Final  . Neutro Abs 08/10/2014 2.8  1.7 - 7.7 K/uL Final  . Lymphocytes Relative 08/10/2014 69* 12 - 46 % Final  . Lymphs Abs 08/10/2014 7.7* 0.7 - 4.0 K/uL Final  . Monocytes Relative 08/10/2014 2* 3 - 12 % Final  . Monocytes Absolute 08/10/2014 0.2  0.1 - 1.0 K/uL Final  . Eosinophils Relative 08/10/2014 3  0 - 5  % Final  . Eosinophils Absolute 08/10/2014 0.3  0.0 - 0.7 K/uL Final  . Basophils Relative 08/10/2014 1  0 - 1 % Final  . Basophils Absolute 08/10/2014 0.1  0.0 - 0.1 K/uL Final  . Smear Review 08/10/2014     Final   Pending path review  . Sodium 08/10/2014 138  135 - 145 mEq/L Final  . Potassium 08/10/2014 4.3  3.5 - 5.3 mEq/L Final  . Chloride 08/10/2014 106  96 - 112 mEq/L Final  . CO2 08/10/2014 20  19 - 32 mEq/L Final  . Glucose, Bld 08/10/2014 99  70 - 99 mg/dL Final  . BUN 14/78/2956 11  6 - 23 mg/dL Final  . Creat 21/30/8657 0.63  0.50 - 1.10 mg/dL Final  . Total Bilirubin 08/10/2014 0.4  0.2 - 1.1 mg/dL Final  . Alkaline Phosphatase 08/10/2014 93  39 -  117 U/L Final  . AST 08/10/2014 38* 0 - 37 U/L Final  . ALT 08/10/2014 26  0 - 35 U/L Final  . Total Protein 08/10/2014 7.4  6.0 - 8.3 g/dL Final  . Albumin 40/98/1191 4.0  3.5 - 5.2 g/dL Final  . Calcium 47/82/9562 9.5  8.4 - 10.5 mg/dL Final  . Path Review 13/11/6576 SEE NOTE   Final   Comment: Absolute lymphocytosis. Favor reactive process, recommend clinical correlation. Normocytic anemia with polychromasia and anisocytosis.   The overall findings in this smear are consistent with a reactive thrombocytosis.  Clinical correlation is recommended. Reviewed by Nehemiah Massed Mammarappallil MD (Electronic Signature on File) 08/11/2014     Urinalysis    Component Value Date/Time   COLORURINE AMBER* 07/19/2014 0945   APPEARANCEUR CLOUDY* 07/19/2014 0945   LABSPEC 1.030 07/19/2014 0945   PHURINE 6.0 07/19/2014 0945   GLUCOSEU NEGATIVE 07/19/2014 0945   HGBUR NEGATIVE 07/19/2014 0945   BILIRUBINUR SMALL* 07/19/2014 0945   KETONESUR 15* 07/19/2014 0945   PROTEINUR NEGATIVE 07/19/2014 0945   UROBILINOGEN 1.0 07/19/2014 0945   NITRITE NEGATIVE 07/19/2014 0945   LEUKOCYTESUR MODERATE* 07/19/2014 0945    STUDIES: Ct Abdomen Pelvis W Contrast  11/24/2014   CLINICAL DATA:  Follow up portal vein thrombosis. Occasional mid  abdominal pain. History of back surgery and splenectomy. Subsequent encounter.  EXAM: CT ABDOMEN AND PELVIS WITH CONTRAST  TECHNIQUE: Multidetector CT imaging of the abdomen and pelvis was performed using the standard protocol following bolus administration of intravenous contrast.  CONTRAST:  50mL OMNIPAQUE IOHEXOL 300 MG/ML SOLN, OMNIPAQUE IOHEXOL 300 MG/ML SOLN  COMPARISON:  CTs 07/24/2014 and 07/19/2014.  FINDINGS: Lower chest: Interval clearing of the lung bases. No significant pleural or pericardial effusion.  Hepatobiliary: Interval re- cannulization of the right portal vein. No definite residual intravascular thrombus demonstrated. The liver demonstrates homogeneous enhancement and no focal abnormality. No evidence of gallstones, gallbladder wall thickening or biliary dilatation.  Pancreas: Unremarkable. No pancreatic ductal dilatation or surrounding inflammatory changes.  Spleen: Stable splenic lobularity consistent with regenerated splenic tissue following reported splenectomy.  Adrenals/Urinary Tract: Both adrenal glands appear normal. The kidneys appear normal without evidence of urinary tract calculus, suspicious lesion or hydronephrosis. No bladder abnormalities are seen.  Stomach/Bowel: No evidence of bowel wall thickening, distention or surrounding inflammatory change. The appendix appears normal.  Vascular/Lymphatic: There are no enlarged abdominal or pelvic lymph nodes. As above, re- cannulization of the right portal vein. The splenic and superior mesenteric veins are patent. No significant vascular findings.  Reproductive: Unremarkable.  Other: No evidence of abdominal wall mass or hernia.  Musculoskeletal: No acute or significant osseous findings. Stable thoracolumbar scoliosis status post thoracic Harrington rod fixation.  IMPRESSION: 1. Interval resolution of previously demonstrated portal vein thrombosis. No large vessel occlusion or residual thrombus demonstrated. 2. No acute abdominal  pelvic findings. 3. Scoliosis. 4. Resolved basilar atelectasis and small right pleural effusion.   Electronically Signed   By: Carey Bullocks M.D.   On: 11/24/2014 12:22    ASSESSMENT: 19 y.o. [place] woman   (1) s/p remote traumatic splenectomy age 62  (a) most recent "triple vaccination" 2013  (2) Right portal v thrombus documented 07/19/2014  (a) anticoagulated initially w heparin, transitioned to rivaroxaban 07/23/2014  (b) extensive workup found elevated CMV IgM antibodies and elevated CMV DNA (see #3 below)  (c) there was no evidence of a JAK2 mutation, prothrombin gene mutation, factor V Leiden mutation, or evidence of a lupus anticoagulant, abnormalities  in beta-2 glycoprotein, and only indeterminate elevations in anticardiolipin antibodies; CK, rheumatoid factor, RPR and cryoglobulins were negative; hepatitis viral studies and HIV titers were likewise negative  (d) repeat abdominal CT 11/24/2014 shows resolution of the portal vein clot  (3) CMV mononucleosis diagnosed April 2016, treated with valganciclovir 14 days  PLAN: [name] is tolerating the Xarelto without event and she will complete 6 months in October. We will stop anticoagulation at that time, and likely release her from follow-up here.  Chronically she will need prophylaxis secondary to her remote splenectomy and that can be performed through her primary care physician.  The patient knows to call for any problems that may develop before her next visit here. Lowella Dell, MD  11/25/2014 9:19 AM

## 2014-11-27 DIAGNOSIS — B2710 Cytomegaloviral mononucleosis without complications: Secondary | ICD-10-CM | POA: Insufficient documentation

## 2014-12-02 ENCOUNTER — Encounter (HOSPITAL_COMMUNITY): Payer: Self-pay | Admitting: Emergency Medicine

## 2014-12-02 ENCOUNTER — Emergency Department (HOSPITAL_COMMUNITY): Payer: No Typology Code available for payment source

## 2014-12-02 ENCOUNTER — Emergency Department (HOSPITAL_COMMUNITY)
Admission: EM | Admit: 2014-12-02 | Discharge: 2014-12-02 | Disposition: A | Discharge status: Home / Self Care | Payer: No Typology Code available for payment source | Attending: Emergency Medicine | Admitting: Emergency Medicine

## 2014-12-02 DIAGNOSIS — Y92410 Unspecified street and highway as the place of occurrence of the external cause: Secondary | ICD-10-CM | POA: Diagnosis not present

## 2014-12-02 DIAGNOSIS — Z8679 Personal history of other diseases of the circulatory system: Secondary | ICD-10-CM | POA: Diagnosis not present

## 2014-12-02 DIAGNOSIS — M25562 Pain in left knee: Secondary | ICD-10-CM

## 2014-12-02 DIAGNOSIS — S8992XA Unspecified injury of left lower leg, initial encounter: Secondary | ICD-10-CM | POA: Insufficient documentation

## 2014-12-02 DIAGNOSIS — S6991XA Unspecified injury of right wrist, hand and finger(s), initial encounter: Secondary | ICD-10-CM | POA: Insufficient documentation

## 2014-12-02 DIAGNOSIS — M79641 Pain in right hand: Secondary | ICD-10-CM

## 2014-12-02 DIAGNOSIS — R519 Headache, unspecified: Secondary | ICD-10-CM

## 2014-12-02 DIAGNOSIS — Z79899 Other long term (current) drug therapy: Secondary | ICD-10-CM | POA: Insufficient documentation

## 2014-12-02 DIAGNOSIS — S299XXA Unspecified injury of thorax, initial encounter: Secondary | ICD-10-CM | POA: Insufficient documentation

## 2014-12-02 DIAGNOSIS — S0990XA Unspecified injury of head, initial encounter: Secondary | ICD-10-CM | POA: Diagnosis not present

## 2014-12-02 DIAGNOSIS — Y999 Unspecified external cause status: Secondary | ICD-10-CM | POA: Insufficient documentation

## 2014-12-02 DIAGNOSIS — S4992XA Unspecified injury of left shoulder and upper arm, initial encounter: Secondary | ICD-10-CM | POA: Insufficient documentation

## 2014-12-02 DIAGNOSIS — R51 Headache: Secondary | ICD-10-CM

## 2014-12-02 DIAGNOSIS — Y9389 Activity, other specified: Secondary | ICD-10-CM | POA: Insufficient documentation

## 2014-12-02 HISTORY — DX: Portal vein thrombosis: I81

## 2014-12-02 MED ORDER — METHOCARBAMOL 500 MG PO TABS: 500.0000 mg | ORAL_TABLET | Freq: Two times a day (BID) | ORAL | Status: DC

## 2014-12-02 MED ORDER — HYDROCODONE-ACETAMINOPHEN 5-325 MG PO TABS
2.0000 | ORAL_TABLET | Freq: Once | ORAL | Status: AC
  Administered 2014-12-02: 2 via ORAL
  Filled 2014-12-02: qty 2

## 2014-12-02 MED ORDER — DIAZEPAM 5 MG/ML IJ SOLN
5.0000 mg | Freq: Once | INTRAMUSCULAR | Status: AC
  Administered 2014-12-02: 5 mg via INTRAMUSCULAR
  Filled 2014-12-02: qty 2

## 2014-12-02 MED ORDER — IBUPROFEN 800 MG PO TABS: 800.0000 mg | ORAL_TABLET | Freq: Three times a day (TID) | ORAL | Status: DC

## 2014-12-02 MED ORDER — HYDROCODONE-ACETAMINOPHEN 5-325 MG PO TABS: 2.0000 | ORAL_TABLET | ORAL | Status: DC | PRN
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
## 2014-12-02 NOTE — ED Notes (Signed)
PA at bedside.

## 2015-01-04 ENCOUNTER — Telehealth: Payer: Self-pay | Admitting: Oncology

## 2015-01-04 NOTE — Telephone Encounter (Signed)
Spoke with mother which is primary number listed. Spoke with mother in regards to confirming appointment change for 10/28 MD visit to 11/11 with labs still 10/28.Will have daughter call back.

## 2015-01-06 ENCOUNTER — Encounter (HOSPITAL_BASED_OUTPATIENT_CLINIC_OR_DEPARTMENT_OTHER): Payer: Self-pay

## 2015-01-06 ENCOUNTER — Emergency Department (HOSPITAL_BASED_OUTPATIENT_CLINIC_OR_DEPARTMENT_OTHER)
Admission: EM | Admit: 2015-01-06 | Discharge: 2015-01-06 | Disposition: A | Discharge status: Home / Self Care | Payer: Medicaid Other | Attending: Emergency Medicine | Admitting: Emergency Medicine

## 2015-01-06 ENCOUNTER — Emergency Department (HOSPITAL_BASED_OUTPATIENT_CLINIC_OR_DEPARTMENT_OTHER): Payer: Medicaid Other

## 2015-01-06 DIAGNOSIS — J40 Bronchitis, not specified as acute or chronic: Secondary | ICD-10-CM | POA: Diagnosis not present

## 2015-01-06 DIAGNOSIS — R05 Cough: Secondary | ICD-10-CM | POA: Diagnosis present

## 2015-01-06 DIAGNOSIS — Z86718 Personal history of other venous thrombosis and embolism: Secondary | ICD-10-CM | POA: Insufficient documentation

## 2015-01-06 DIAGNOSIS — Z3202 Encounter for pregnancy test, result negative: Secondary | ICD-10-CM | POA: Diagnosis not present

## 2015-01-06 DIAGNOSIS — R1011 Right upper quadrant pain: Secondary | ICD-10-CM | POA: Insufficient documentation

## 2015-01-06 DIAGNOSIS — R1010 Upper abdominal pain, unspecified: Secondary | ICD-10-CM

## 2015-01-06 LAB — CBC WITH DIFFERENTIAL/PLATELET
BASOS ABS: 0 10*3/uL (ref 0.0–0.1)
BASOS PCT: 0 %
Eosinophils Absolute: 0.4 10*3/uL (ref 0.0–0.7)
Eosinophils Relative: 3 %
HEMATOCRIT: 39.1 % (ref 36.0–46.0)
HEMOGLOBIN: 12.8 g/dL (ref 12.0–15.0)
LYMPHS PCT: 42 %
Lymphs Abs: 5.9 10*3/uL — ABNORMAL HIGH (ref 0.7–4.0)
MCH: 30.5 pg (ref 26.0–34.0)
MCHC: 32.7 g/dL (ref 30.0–36.0)
MCV: 93.1 fL (ref 78.0–100.0)
Monocytes Absolute: 1.4 10*3/uL — ABNORMAL HIGH (ref 0.1–1.0)
Monocytes Relative: 10 %
NEUTROS ABS: 6.3 10*3/uL (ref 1.7–7.7)
NEUTROS PCT: 45 %
Platelets: 309 10*3/uL (ref 150–400)
RBC: 4.2 MIL/uL (ref 3.87–5.11)
RDW: 15.3 % (ref 11.5–15.5)
WBC: 14 10*3/uL — ABNORMAL HIGH (ref 4.0–10.5)

## 2015-01-06 LAB — COMPREHENSIVE METABOLIC PANEL
ALBUMIN: 4.1 g/dL (ref 3.5–5.0)
ALK PHOS: 98 U/L (ref 38–126)
ALT: 56 U/L — AB (ref 14–54)
AST: 63 U/L — AB (ref 15–41)
Anion gap: 9 (ref 5–15)
BILIRUBIN TOTAL: 0.4 mg/dL (ref 0.3–1.2)
BUN: 13 mg/dL (ref 6–20)
CALCIUM: 9.9 mg/dL (ref 8.9–10.3)
CO2: 25 mmol/L (ref 22–32)
CREATININE: 0.65 mg/dL (ref 0.44–1.00)
Chloride: 105 mmol/L (ref 101–111)
GFR calc Af Amer: >60 mL/min (ref >60)
GFR calc non Af Amer: >60 mL/min (ref >60)
GLUCOSE: 97 mg/dL (ref 65–99)
Potassium: 3.8 mmol/L (ref 3.5–5.1)
Sodium: 139 mmol/L (ref 135–145)
TOTAL PROTEIN: 7.6 g/dL (ref 6.5–8.1)

## 2015-01-06 LAB — URINALYSIS, ROUTINE W REFLEX MICROSCOPIC
Bilirubin Urine: NEGATIVE
GLUCOSE, UA: NEGATIVE mg/dL
Hgb urine dipstick: NEGATIVE
Ketones, ur: NEGATIVE mg/dL
Nitrite: NEGATIVE
PROTEIN: NEGATIVE mg/dL
SPECIFIC GRAVITY, URINE: 1.024 (ref 1.005–1.030)
UROBILINOGEN UA: 0.2 mg/dL (ref 0.0–1.0)
pH: 8 (ref 5.0–8.0)

## 2015-01-06 LAB — URINE MICROSCOPIC-ADD ON

## 2015-01-06 LAB — PROTIME-INR
INR: 0.92 (ref 0.00–1.49)
Prothrombin Time: 12.6 seconds (ref 11.6–15.2)

## 2015-01-06 LAB — PREGNANCY, URINE: PREG TEST UR: NEGATIVE

## 2015-01-06 MED ORDER — IOHEXOL 300 MG/ML  SOLN
25.0000 mL | Freq: Once | INTRAMUSCULAR | Status: AC | PRN
  Administered 2015-01-06: 25 mL via ORAL

## 2015-01-06 MED ORDER — IOHEXOL 300 MG/ML  SOLN
100.0000 mL | Freq: Once | INTRAMUSCULAR | Status: AC | PRN
  Administered 2015-01-06: 100 mL via INTRAVENOUS

## 2015-01-06 MED ORDER — BENZONATATE 100 MG PO CAPS
100.0000 mg | ORAL_CAPSULE | Freq: Once | ORAL | Status: AC
  Administered 2015-01-06: 100 mg via ORAL
  Filled 2015-01-06: qty 1

## 2015-01-06 MED ORDER — HYDROCOD POLST-CPM POLST ER 10-8 MG/5ML PO SUER
5.0000 mL | Freq: Once | ORAL | Status: DC
  Filled 2015-01-06: qty 5

## 2015-01-06 MED ORDER — BENZONATATE 100 MG PO CAPS: 100.0000 mg | ORAL_CAPSULE | Freq: Three times a day (TID) | ORAL | Status: DC

## 2015-01-06 MED ORDER — ALBUTEROL SULFATE HFA 108 (90 BASE) MCG/ACT IN AERS
2.0000 | INHALATION_SPRAY | Freq: Once | RESPIRATORY_TRACT | Status: AC
  Administered 2015-01-06: 2 via RESPIRATORY_TRACT
  Filled 2015-01-06: qty 6.7

## 2015-01-06 MED ORDER — HYDROCOD POLST-CPM POLST ER 10-8 MG/5ML PO SUER: 5.0000 mL | Freq: Two times a day (BID) | ORAL | Status: DC | PRN

## 2015-01-06 NOTE — ED Provider Notes (Signed)
CSN: [id number]     Arrival date & time 01/06/15  1746 History   First MD Initiated Contact with Patient 01/06/15 1811     Chief Complaint  Patient presents with  . Cough     (Consider location/radiation/quality/duration/timing/severity/associated sxs/prior Treatment) HPI Comments: Patient with history of portal vein thrombosis -- presents with complaint of abdominal pain and cough. These symptoms have been ongoing for the past 4-5 days. Patient describes pain in her right upper abdomen similar to previous blood clots. She denies fever, nausea, vomiting, or diarrhea. Patient has also had a productive cough over this time without fever. She has had associated nasal congestion. She will occasionally have some posttussive emesis and shortness of breath that is associated with coughing. No treatments prior to arrival.  Patient states that she discontinued anticoagulation as of one month ago. She was told that her blood clots were possibly due to use of OCPs. She admits to being poorly compliant with this medication.  Patient is a 19 y.o. Carol [name] presenting with cough. The history is provided by the patient and medical records.  Cough Associated symptoms: no chest pain, no fever, no headaches, no myalgias, no rash, no rhinorrhea, no shortness of breath, no sore throat and no wheezing     Past Medical History  Diagnosis Date  . 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 Back surgery    . Splenectomy, total     No family history on file. Social History  Substance Use Topics  . Smoking status: Never Smoker   . Smokeless tobacco: None  . Alcohol Use: No   OB History    No data available     Review of Systems  Constitutional: Negative for fever.  HENT: Negative for rhinorrhea and sore throat.   Eyes: Negative for redness.  Respiratory: Positive for cough. Negative for shortness of breath and wheezing.    Cardiovascular: Negative for chest pain.  Gastrointestinal: Positive for abdominal pain. Negative for nausea, vomiting and diarrhea.  Genitourinary: Negative for dysuria.  Musculoskeletal: Negative for myalgias.  Skin: Negative for rash.  Neurological: Negative for headaches.      Allergies  Tape  Home Medications   Prior to Admission medications   Not on File   BP 128/67 mmHg  Pulse 79  Temp(Src) 98.6 F (37 C)  Resp 20  Ht  (1.499 m)  Wt 167 lb (75.751 kg)  BMI 33.71 kg/m2  SpO2 100%  LMP 12/16/2014   Physical Exam  Constitutional: She appears well-developed and well-nourished.  HENT:  Head: Normocephalic and atraumatic.  Eyes: Conjunctivae are normal. Right eye exhibits no discharge. Left eye exhibits no discharge.  Neck: Normal range of motion. Neck supple.  Cardiovascular: Normal rate, regular rhythm and normal heart sounds.   Pulmonary/Chest: Effort normal and breath sounds normal. No respiratory distress. She has no wheezes. She has no rales.  Frequent coughing during exam  Abdominal: Soft. She exhibits no distension. There is tenderness (right upper quadrant tenderness to palpation, mild to moderate). There is no rebound and no guarding.  Neurological: She is alert.  Skin: Skin is warm and dry.  Psychiatric: She has a normal mood and affect.  Nursing note and vitals reviewed.   ED Course  Procedures (including critical care time) Labs Review Labs Reviewed  URINALYSIS, ROUTINE W REFLEX MICROSCOPIC (NOT AT [facility]) - Abnormal; Notable for the following:    APPearance  CLOUDY (*)    Leukocytes, UA SMALL (*)    All other components within normal limits  CBC WITH DIFFERENTIAL/PLATELET - Abnormal; Notable for the following:    WBC 14.0 (*)    Lymphs Abs 5.9 (*)    Monocytes Absolute 1.4 (*)    All other components within normal limits  COMPREHENSIVE METABOLIC PANEL - Abnormal; Notable for the following:    AST 63 (*)    ALT 56 (*)    All other  components within normal limits  URINE MICROSCOPIC-ADD ON - Abnormal; Notable for the following:    Squamous Epithelial / LPF FEW (*)    Bacteria, UA FEW (*)    All other components within normal limits  PREGNANCY, URINE  PROTIME-INR  CBC WITH DIFFERENTIAL/PLATELET    Imaging Review Dg Chest 2 View  01/06/2015   CLINICAL DATA:  Cough and history bronchitis  EXAM: CHEST  2 VIEW  COMPARISON:  07/27/2014  FINDINGS: Normal heart size and mediastinal contours. No acute infiltrate or edema. No effusion or pneumothorax. Scoliosis with intact appearing spinal fixation hardware.  IMPRESSION: No active cardiopulmonary disease.   Electronically Signed   By: Marnee Spring M.D.   On: 01/06/2015 21:11   Ct Abdomen Pelvis W Contrast  01/06/2015   CLINICAL DATA:  Right upper quadrant pain for 2 days, nausea and vomiting, remote history of splenectomy and portal vein thrombosis  EXAM: CT ABDOMEN AND PELVIS WITH CONTRAST  TECHNIQUE: Multidetector CT imaging of the abdomen and pelvis was performed using the standard protocol following bolus administration of intravenous contrast.  CONTRAST:  25mL OMNIPAQUE IOHEXOL 300 MG/ML SOLN, OMNIPAQUE IOHEXOL 300 MG/ML SOLN  COMPARISON:  11/24/2014  FINDINGS: Lung bases are free of acute infiltrate or sizable effusion.  The liver, gallbladder, adrenal glands and kidneys are within normal limits. The spleen has been surgically removed. Some regenerated splenic tissue is noted. Portal vein is shown to be patent. The pancreas is within normal limits.  The bladder is partially distended. The uterus and ovaries are within normal limits and stable from the prior study. No free pelvic fluid is noted. No pelvic mass lesion is seen. The appendix is well visualized and within normal limits. Some scattered small lymph nodes are noted in the right lower quadrant likely reactive in nature. Postsurgical changes are again noted at the thoracolumbar junction. A scoliosis is again seen and  stable.  IMPRESSION: Chronic changes as described above.  No acute abnormality is noted.   Electronically Signed   By: Alcide Clever M.D.   On: 01/06/2015 21:21   I have personally reviewed and evaluated these images and lab results as part of my medical decision-making.   EKG Interpretation None      8:04 PM Patient seen and examined. Work-up initiated. Medications ordered.   Vital signs reviewed and are as follows: BP 128/67 mmHg  Pulse 79  Temp(Src) 98.6 F (37 C)  Resp 20  Ht  (1.499 m)  Wt 167 lb (75.751 kg)  BMI 33.71 kg/m2  SpO2 100%  LMP 12/16/2014   CT results reviewed by myself. There are negative for recurrent portal vein thrombosis. No other acute etiology noted. Chest x-ray is negative as well.  Lab work is reassuring. Leukocytosis explained by bronchitis. Will treat as a bronchitis given these abdominal CT findings. Patient given an albuterol inhaler prior to discharge. Tessalon and Tussionex for home. Patient updated on results and agrees with the plan. She was told that her liver  enzymes should be monitored by her PCP.  The patient was urged to return to the Emergency Department immediately with worsening of current symptoms, worsening abdominal pain, persistent vomiting, blood noted in stools, fever, or any other concerns. The patient verbalized understanding.   She is also urged to return with high persistent fever, difficulty breathing, shortness of breath, or other concerns.   MDM   Final diagnoses:  Bronchitis  Pain of upper abdomen   Patient with concurrent onset of cough and abdominal pain. To the patient, abdominal pain feels like previous right portal vein thrombosis. This was evaluated with CT scan here which was negative for recurrent thrombosis. No other acute findings on abdominal CT. Lab work is otherwise remarkable for a mild leukocytosis as well as mildly elevated liver function tests. Chest x-ray does not demonstrate pneumonia. This point,  her symptoms are clinically consistent with bronchitis. Her upper abdominal pain may be muscular tenderness due to her frequent coughing. No concern for emergent etiology of her symptoms tonight. This patient appears well and her symptoms are controlled without positive findings tonight on her evaluation, feel that she is appropriate for discharge to home with PCP follow-up.    Renne Crigler, PA-C 01/06/15 2246  [name], MD 01/07/15 [phone]

## 2015-01-06 NOTE — ED Notes (Signed)
C/o cough until vomits, nasal congestion x 4 days-pt NAD

## 2015-01-06 NOTE — Discharge Instructions (Signed)
Please read and follow all provided instructions.  Your diagnoses today include:  1. Bronchitis   2. Pain of upper abdomen    Tests performed today include:  Chest x-ray - does not show any pneumonia  Abdominal CT - shows normal portal vein without evidence of other blood clots  Blood counts and electrolytes  Liver function - slightly elevated  Urine test - normal  Vital signs. See below for your results today.   Medications prescribed:   Albuterol inhaler - medication that opens up your airway  Use inhaler as follows: 1-2 puffs with spacer every 4 hours as needed for wheezing, cough, or shortness of breath.    Tussinex - narcotic cough suppressant syrup  You have been prescribed narcotic cough suppressant such as Tussinex: DO NOT drive or perform any activities that require you to be awake and alert because this medicine can make you drowsy.    Tessalon Perles - cough suppressant medication  Take any prescribed medications only as directed.  Home care instructions:  Follow any educational materials contained in this packet.  Follow-up instructions: Please follow-up with your primary care provider in the next 3 days for further evaluation of your symptoms and a recheck if you are not feeling better.   Return instructions:   Please return to the Emergency Department if you experience worsening symptoms.  Please return with worsening wheezing, shortness of breath, or difficulty breathing.  Return with persistent fever above 101F.   Please return if you have any other emergent concerns.  Additional Information:  Your vital signs today were: BP 128/67 mmHg   Pulse 79   Temp(Src) 98.6 F (37 C)   Resp 20   Ht  (1.499 m)   Wt 167 lb (75.751 kg)   BMI 33.71 kg/m2   SpO2 100%   LMP 12/16/2014 If your blood pressure (BP) was elevated above 135/85 this visit, please have this repeated by your doctor within one month. --------------

## 2015-01-06 NOTE — ED Notes (Signed)
tussinex held pt has no ride

## 2015-02-04 ENCOUNTER — Encounter: Payer: Medicaid Other | Admitting: Oncology

## 2015-02-04 ENCOUNTER — Other Ambulatory Visit: Payer: Medicaid Other

## 2015-02-04 ENCOUNTER — Telehealth: Payer: Self-pay | Admitting: Oncology

## 2015-02-04 NOTE — Telephone Encounter (Signed)
Returned patient call re r/s appointments. R/s 10/28 lab to 11/1 and patient per patient she will KSA w/GM 11/11. Patient has both appointment date/times for 11/1 and 11/11.

## 2015-02-08 ENCOUNTER — Other Ambulatory Visit (HOSPITAL_BASED_OUTPATIENT_CLINIC_OR_DEPARTMENT_OTHER): Payer: Medicaid Other

## 2015-02-08 ENCOUNTER — Other Ambulatory Visit: Payer: Self-pay | Admitting: Oncology

## 2015-02-08 DIAGNOSIS — D689 Coagulation defect, unspecified: Secondary | ICD-10-CM | POA: Diagnosis present

## 2015-02-08 LAB — CBC WITH DIFFERENTIAL/PLATELET
BASO%: 0.5 % (ref 0.0–2.0)
Basophils Absolute: 0 10*3/uL (ref 0.0–0.1)
EOS%: 3.7 % (ref 0.0–7.0)
Eosinophils Absolute: 0.3 10*3/uL (ref 0.0–0.5)
HEMATOCRIT: 40.7 % (ref 34.8–46.6)
HGB: 13.5 g/dL (ref 11.6–15.9)
LYMPH#: 5.3 10*3/uL — AB (ref 0.9–3.3)
LYMPH%: 56.3 % — AB (ref 14.0–49.7)
MCH: 30.5 pg (ref 25.1–34.0)
MCHC: 33.1 g/dL (ref 31.5–36.0)
MCV: 92.2 fL (ref 79.5–101.0)
MONO#: 0.9 10*3/uL (ref 0.1–0.9)
MONO%: 10 % (ref 0.0–14.0)
NEUT#: 2.7 10*3/uL (ref 1.5–6.5)
NEUT%: 29.5 % — AB (ref 38.4–76.8)
Platelets: 326 10*3/uL (ref 145–400)
RBC: 4.42 10*6/uL (ref 3.70–5.45)
RDW: 15.6 % — ABNORMAL HIGH (ref 11.2–14.5)
WBC: 9.3 10*3/uL (ref 3.9–10.3)

## 2015-02-08 LAB — TECHNOLOGIST REVIEW

## 2015-02-08 LAB — D-DIMER, QUANTITATIVE: D-Dimer, Quant: 0.74 ug/mL-FEU — ABNORMAL HIGH (ref 0.00–0.48)

## 2015-02-18 ENCOUNTER — Ambulatory Visit (HOSPITAL_BASED_OUTPATIENT_CLINIC_OR_DEPARTMENT_OTHER): Payer: Medicaid Other | Admitting: Oncology

## 2015-02-18 VITALS — BP 92/75 | HR 74 | Temp 98.5°F | Resp 20 | Ht 59.0 in | Wt 171.9 lb

## 2015-02-18 DIAGNOSIS — Z86718 Personal history of other venous thrombosis and embolism: Secondary | ICD-10-CM

## 2015-02-18 DIAGNOSIS — I81 Portal vein thrombosis: Secondary | ICD-10-CM

## 2015-02-18 NOTE — Progress Notes (Signed)
ID: Carol [name] OB: Sep 02, 1995  MR#: [id number]  CSN#:[id number]  PCP: Karie ChimeraEESE,BETTI D, MD GYN:  Dahlia Bailiffarren Wright MD SU:  OTHER MD: Cliffton AstersJohn Campbell MD  CHIEF COMPLAINT: history of Right portal vein clot; remote splenectomy  CURRENT THERAPY: observaton  HISTORY OF PRESENT ILLNESS: From the earlier summary note:  "Carol [name]" developed nausea and abdominal discomfort 07/09/2014. She felt better the next 2 days, then on 07/12/2014 again experienced vomiting, and now also temperatures up to 103 degrees. She saw her PCP 07/14/2014 and was started on prochlorperazine, with little effect. She was also on ibuprofen for the abdominal pain. The fevers persisted and this AM took her to the ED where a urinalysis was obtained, showing mod leukocytes. An abd US showed a normal GB but echogenic material in the R porta; v suggestive of thrombus. This was confirmed with CT of the abd and pelvis showing a filling defect in the R portal v. There were no other abnormalities other than the absence of a spleen--the pt being s/p traumatic splenectomy age 19. ".  The patient's workup was guided by infectious diseases, and I am copying Dr. 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 An abdominal CT scan revealed what appeared to be an acute thrombus in the right portal vein. She had high spiking fevers. Initially I treated her for probable septic thrombophlebitis with empiric IV antibiotics. She was also anticoagulated. She continued to have high spiking fevers and developed leukocytosis with lymphocytosis and atypical lymphocytes. Her hepatic transaminases were also elevated. Cytomegalovirus IgM and Epstein-Barr virus IgM antibodies were both elevated. Her cytomegalovirus DNA PCR was also positive. Because of persistent fevers and a new pruritic  rash on empiric antibiotics I stopped the antibiotics and started her on valganciclovir. She defervesced and started to improve. She was discharged home and completed 14 days of therapy yesterday. She is feeling much better "  Her subsequent history is as detailed below.   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 She stopped all anticoagulation in September. There has been no clinical change except that generally she "feels better".   REVIEW OF SYSTEMS: She is having regular periods. Occasionally they are heavy. She is not using contraception at present. She had a recent episode of abdominal discomfort, which turned out to be apparently related to an upper respiratory infection. That has resolved. 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 She also has a second job. She is worried about her weight which goes "up and down" without her seeming to have much control on it one way or the other. She is not exercising regularly. A detailed review of systems today is otherwise entirely negative.   PAST MEDICAL HISTORY: Past Medical History  Diagnosis Date  . 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 Back surgery    . Splenectomy, total     GYNECOLOGIC HISTORY:  Premenopausal. GX P0.   FAMILY HISTORY No family history on file. The patient's maternal grandfather had a LE-DVT in his 5050's, after a fall. He died of an MI age 20. The aptient's maternal grandmother is alove, age 20, s/p multiple strokes (  not a smoker). The patient's mother has no Hx of DVT. The patient knows little about her biological father. The patient has one brother and one sister, in good health  SOCIAL HISTORY:  Carol [name] lives with her mother Lavetta Nielsen and her stepfatehr Lisabeth Register. 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 Carol [name] is going to cosmetology school.    ADVANCED DIRECTIVES: not in place   HEALTH MAINTENANCE: Social History  Substance Use Topics  . Smoking status: Never Smoker   . Smokeless tobacco: Not on file  . Alcohol Use: No     Allergies  Allergen Reactions  . Tape Hives    Can use paper tape    Current Outpatient Prescriptions  Medication Sig Dispense Refill  . benzonatate (TESSALON) 100 MG capsule Take 1 capsule (100 mg total) by mouth every 8 (eight) hours. 15 capsule 0  . chlorpheniramine-HYDROcodone (TUSSIONEX PENNKINETIC ER) 10-8 MG/5ML SUER Take 5 mLs by mouth every 12 (twelve) hours as needed for cough. 140 mL 0  . [DISCONTINUED] rivaroxaban (XARELTO) 20 MG TABS tablet Take 1 tablet (20 mg total) by mouth daily with supper. 30 tablet 3   No current facility-administered medications for this visit.    OBJECTIVE: young White woman who appears well Filed Vitals:   02/18/15 1032  BP: 92/75  Pulse: 74  Temp: 98.5 F (36.9 C)  Resp: 20     Body mass index is 34.7 kg/(m^2).   ECOG FS:0 - Asymptomatic  Sclerae unicteric, pupils round and equal Oropharynx clear and moist-- no thrush or other lesions No cervical or supraclavicular adenopathy Lungs no rales or rhonchi Heart regular rate and rhythm Abd soft, obese, nontender, positive bowel sounds MSK no focal spinal tenderness, no joint edema Neuro: nonfocal, well oriented, positive affect Breasts: Deferred    LAB RESULTS: Appointment on 02/08/2015  Component Date Value Ref Range Status  . WBC 02/08/2015 9.3  3.9 - 10.3 10e3/uL Final  . NEUT# 02/08/2015 2.7  1.5 - 6.5 10e3/uL Final  . HGB 02/08/2015 13.5  11.6 - 15.9 g/dL Final  . HCT 16/01/9603 40.7  34.8 - 46.6 % Final  . Platelets 02/08/2015 326  145 - 400 10e3/uL Final  . MCV 02/08/2015 92.2  79.5 - 101.0 fL Final  . MCH 02/08/2015 30.5  25.1 - 34.0 pg Final  . MCHC 02/08/2015 33.1  31.5 - 36.0 g/dL Final  . RBC 54/12/8117 4.42  3.70  - 5.45 10e6/uL Final  . RDW 02/08/2015 15.6* 11.2 - 14.5 % Final  . lymph# 02/08/2015 5.3* 0.9 - 3.3 10e3/uL Final  . MONO# 02/08/2015 0.9  0.1 - 0.9 10e3/uL Final  . Eosinophils Absolute 02/08/2015 0.3  0.0 - 0.5 10e3/uL Final  . Basophils Absolute 02/08/2015 0.0  0.0 - 0.1 10e3/uL Final  . NEUT% 02/08/2015 29.5* 38.4 - 76.8 % Final  . LYMPH% 02/08/2015 56.3* 14.0 - 49.7 % Final  . MONO% 02/08/2015 10.0  0.0 - 14.0 % Final  . EOS% 02/08/2015 3.7  0.0 - 7.0 % Final  . BASO% 02/08/2015 0.5  0.0 - 2.0 % Final  . D-Dimer, Quant 02/08/2015 0.74* 0.00 - 0.48 ug/mL-FEU Final   Comment: At the inhouse established cutoff value of 0.48 ug/mL FEU, thismethology has been documented in the literature to have a sensitivityand negative predictive value of at least 98-99%.  The test resultshould be correlated with an assessment of the clinical  probability ofDVT/VTE.   Marland Kitchen Technologist Review 02/08/2015 few variant lymphs  Final    Urinalysis    Component Value Date/Time   COLORURINE YELLOW 01/06/2015 1940   APPEARANCEUR CLOUDY* 01/06/2015 1940   LABSPEC 1.024 01/06/2015 1940   PHURINE 8.0 01/06/2015 1940   GLUCOSEU NEGATIVE 01/06/2015 1940   HGBUR NEGATIVE 01/06/2015 1940   BILIRUBINUR NEGATIVE 01/06/2015 1940   KETONESUR NEGATIVE 01/06/2015 1940   PROTEINUR NEGATIVE 01/06/2015 1940   UROBILINOGEN 0.2 01/06/2015 1940   NITRITE NEGATIVE 01/06/2015 1940   LEUKOCYTESUR SMALL* 01/06/2015 1940    STUDIES: CLINICAL DATA: Right upper quadrant pain for 2 days, nausea and vomiting, remote history of splenectomy and portal vein thrombosis  EXAM: CT ABDOMEN AND PELVIS WITH CONTRAST  TECHNIQUE: Multidetector CT imaging of the abdomen and pelvis was performed using the standard protocol following bolus administration of intravenous contrast.  CONTRAST: 25mL OMNIPAQUE IOHEXOL 300 MG/ML SOLN, OMNIPAQUE IOHEXOL 300 MG/ML SOLN  COMPARISON: 11/24/2014  FINDINGS: Lung bases are free  of acute infiltrate or sizable effusion.  The liver, gallbladder, adrenal glands and kidneys are within normal limits. The spleen has been surgically removed. Some regenerated splenic tissue is noted. Portal vein is shown to be patent. The pancreas is within normal limits.  The bladder is partially distended. The uterus and ovaries are within normal limits and stable from the prior study. No free pelvic fluid is noted. No pelvic mass lesion is seen. The appendix is well visualized and within normal limits. Some scattered small lymph nodes are noted in the right lower quadrant likely reactive in nature. Postsurgical changes are again noted at the thoracolumbar junction. A scoliosis is again seen and stable.  IMPRESSION: Chronic changes as described above. No acute abnormality is noted.   Electronically Signed  By: Alcide Clever M.D.  On: 01/06/2015 21:21   ASSESSMENT: 19 y.o. 19 y.o. 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   (a) repeat CT of the abdomen 01/06/2015 showed resolution of the portal vein clot.  (2) splenectomized patiens are at risk of fulminant infections with encapsulated organisms: patient tells me she had her last "triple vaccine" in 2013 years ago, which is adequate  (a) will need Haemophilus, meningococcus and pneumovax revaccination 2023  (3) advised the patient to avoid oral contraceptives and consider copper IUDs or barrier contraceptive methods   PLAN: Carol [name] is back to normal. We reviewed her situation and she understands that while her d-dimer has not completely normalized, it is considerably improved as compared to prior. Her periods are normal at this point.  We discussed pregnancy prevention but at this point she does not want to consider IUDs or other interventions.  We also discussed exercise and I strongly  encouraged her to start a walking program.  At this point I feel comfortable releasing her to her primary care physician. I will be glad to see her at any point in the future if I when the need arises. As of now however we are making no further routine appointments for her here.  Lowella Dell, MD  02/18/2015 10:36 AM

## 2015-03-08 ENCOUNTER — Emergency Department (HOSPITAL_BASED_OUTPATIENT_CLINIC_OR_DEPARTMENT_OTHER)
Admission: EM | Admit: 2015-03-08 | Discharge: 2015-03-08 | Disposition: A | Discharge status: Home / Self Care | Payer: Medicaid Other | Attending: Emergency Medicine | Admitting: Emergency Medicine

## 2015-03-08 ENCOUNTER — Encounter (HOSPITAL_BASED_OUTPATIENT_CLINIC_OR_DEPARTMENT_OTHER): Payer: Self-pay | Admitting: *Deleted

## 2015-03-08 DIAGNOSIS — Z8679 Personal history of other diseases of the circulatory system: Secondary | ICD-10-CM | POA: Insufficient documentation

## 2015-03-08 DIAGNOSIS — R1013 Epigastric pain: Secondary | ICD-10-CM | POA: Insufficient documentation

## 2015-03-08 DIAGNOSIS — J029 Acute pharyngitis, unspecified: Secondary | ICD-10-CM

## 2015-03-08 DIAGNOSIS — R5383 Other fatigue: Secondary | ICD-10-CM | POA: Diagnosis not present

## 2015-03-08 LAB — RAPID STREP SCREEN (MED CTR MEBANE ONLY): Streptococcus, Group A Screen (Direct): NEGATIVE

## 2015-03-08 MED ORDER — HYDROCODONE-ACETAMINOPHEN 7.5-325 MG/15ML PO SOLN: 15.0000 mL | Freq: Four times a day (QID) | ORAL | Status: DC | PRN

## 2015-03-08 MED ORDER — AMOXICILLIN 500 MG PO CAPS: 500.0000 mg | ORAL_CAPSULE | Freq: Two times a day (BID) | ORAL | Status: DC

## 2015-03-08 MED ORDER — DEXAMETHASONE 4 MG PO TABS
6.0000 mg | ORAL_TABLET | Freq: Once | ORAL | Status: AC
  Administered 2015-03-08: 6 mg via ORAL

## 2015-03-08 MED ORDER — DEXAMETHASONE 4 MG PO TABS
ORAL_TABLET | ORAL | Status: AC
  Filled 2015-03-08: qty 2

## 2015-03-08 NOTE — ED Notes (Signed)
Patient states she has a two week history of swollen tonsils.  States for the last two days she has had upper abdominal pain.  States she has vomited, but the vomiting is associated with a cough.  Denies fever or diarrhea.

## 2015-03-08 NOTE — Discharge Instructions (Signed)

## 2015-03-08 NOTE — ED Provider Notes (Signed)
CSN: [id number]     Arrival date & time 03/08/15  0831 History   First MD Initiated Contact with Patient 03/08/15 [phone]     Chief Complaint  Patient presents with  . Abdominal Pain  . Sore Throat     (Consider location/radiation/quality/duration/timing/severity/associated sxs/prior Treatment) Patient is a 19 y.o. female presenting with abdominal pain and pharyngitis. The history is provided by the patient.  Abdominal Pain Associated symptoms: fatigue and sore throat   Associated symptoms: no chills, no dysuria, no fever, no nausea, no shortness of breath and no vomiting   Sore Throat Associated symptoms include abdominal pain. Pertinent negatives include no shortness of breath.   patient has had sore throat for the last couple days. Worse with swallowing. Swelling of her tonsils. States she gets frequent strep throats. No fevers. [name] also got some dull upper abdominal pain. Upper abdominal pain is not unusual for her but she is somewhat worried because she had previous portal vein thrombosis with CMV infection. She had been on anticoagulation but has stopped that under direction from her hematologist. No headache. She has some fatigue. No nausea or vomiting. No diarrhea. She denies possibility of pregnancy. Denies dysuria. She states when the girls in her school had strep throat.  Past Medical History  Diagnosis Date  . 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 Back surgery    . Splenectomy, total     No family history on file. Social History  Substance Use Topics  . Smoking status: Never Smoker   . Smokeless tobacco: Never Used  . Alcohol Use: No   OB History    No data available     Review of Systems  Constitutional: Positive for fatigue. Negative for fever and chills.  HENT: Positive for sore throat and trouble swallowing. Negative for postnasal drip and voice change.   Respiratory: Negative for  shortness of breath.   Gastrointestinal: Positive for abdominal pain. Negative for nausea and vomiting.  Genitourinary: Negative for dysuria.  Musculoskeletal: Negative for back pain.  Skin: Negative for rash and wound.  Neurological: Negative for weakness and light-headedness.      Allergies  Tape  Home Medications   Prior to Admission medications   Medication Sig Start Date End Date Taking? Authorizing Provider  amoxicillin (AMOXIL) 500 MG capsule Take 1 capsule (500 mg total) by mouth 2 (two) times daily. 03/08/15   Carol CoreNathan Sanford Lindblad, MD  HYDROcodone-acetaminophen (HYCET) 7.5-325 mg/15 ml solution Take 15 mLs by mouth every 6 (six) hours as needed for moderate pain. 03/08/15   Carol CoreNathan Hiliana Eilts, MD   BP 111/67 mmHg  Pulse 71  Temp(Src) 98.5 F (36.9 C) (Oral)  Resp 20  SpO2 99%  LMP 03/07/2015 Physical Exam  Constitutional: She appears well-developed and well-nourished.  HENT:  Near kissing tonsils. No exudate. Uvula midline. No stridor.  Neck:  Moderate bilateral anterior cervical lymphadenopathy.  Cardiovascular: Normal rate and regular rhythm.   Pulmonary/Chest: Effort normal.  Abdominal: There is tenderness.  Mild epigastric tenderness without rebound or guarding.  Musculoskeletal: Normal range of motion.  Lymphadenopathy:    She has cervical adenopathy.  Neurological: She is alert.  Skin: Skin is warm.    ED Course  Procedures (including critical care time) Labs Review Labs Reviewed  RAPID STREP SCREEN (NOT AT [facility])  CULTURE, GROUP A STREP    Imaging Review No results found. I have personally reviewed and evaluated  these images and lab results as part of my medical decision-making.   EKG Interpretation None      MDM   Final diagnoses:  Pharyngitis    Patient with sore throat and swollen tonsils. Has had previous splenectomy. Will treat for strep empirically. His previous history of CMV with a portal vein clot. Strep culture sent for following  up if symptoms do not improve. At this time I doubt the abdominal pain is another thrombosis. She states she has these episodes of pain on and off. Will discharge home. May need follow-up. Will give dose of sterile it's to help with the swollen tonsils and will give antibiotics.    Carol Core, MD 03/10/15 [phone]

## 2015-03-10 LAB — CULTURE, GROUP A STREP: Strep A Culture: NEGATIVE

## 2015-09-12 ENCOUNTER — Emergency Department (HOSPITAL_BASED_OUTPATIENT_CLINIC_OR_DEPARTMENT_OTHER): Payer: Medicaid Other

## 2015-09-12 ENCOUNTER — Emergency Department (HOSPITAL_BASED_OUTPATIENT_CLINIC_OR_DEPARTMENT_OTHER)
Admission: EM | Admit: 2015-09-12 | Discharge: 2015-09-13 | Disposition: A | Discharge status: Home / Self Care | Payer: Medicaid Other | Attending: Emergency Medicine | Admitting: Emergency Medicine

## 2015-09-12 ENCOUNTER — Encounter (HOSPITAL_BASED_OUTPATIENT_CLINIC_OR_DEPARTMENT_OTHER): Payer: Self-pay | Admitting: Emergency Medicine

## 2015-09-12 DIAGNOSIS — N390 Urinary tract infection, site not specified: Secondary | ICD-10-CM | POA: Insufficient documentation

## 2015-09-12 DIAGNOSIS — Z79899 Other long term (current) drug therapy: Secondary | ICD-10-CM | POA: Insufficient documentation

## 2015-09-12 DIAGNOSIS — R0602 Shortness of breath: Secondary | ICD-10-CM | POA: Diagnosis not present

## 2015-09-12 DIAGNOSIS — R1030 Lower abdominal pain, unspecified: Secondary | ICD-10-CM

## 2015-09-12 LAB — CBC WITH DIFFERENTIAL/PLATELET
BASOS ABS: 0 10*3/uL (ref 0.0–0.1)
Basophils Relative: 0 %
Eosinophils Absolute: 0.3 10*3/uL (ref 0.0–0.7)
Eosinophils Relative: 2 %
HEMATOCRIT: 39 % (ref 36.0–46.0)
Hemoglobin: 13 g/dL (ref 12.0–15.0)
LYMPHS PCT: 51 %
Lymphs Abs: 7.7 10*3/uL — ABNORMAL HIGH (ref 0.7–4.0)
MCH: 31.5 pg (ref 26.0–34.0)
MCHC: 33.3 g/dL (ref 30.0–36.0)
MCV: 94.4 fL (ref 78.0–100.0)
MONOS PCT: 7 %
Monocytes Absolute: 1.1 10*3/uL — ABNORMAL HIGH (ref 0.1–1.0)
NEUTROS ABS: 6.1 10*3/uL (ref 1.7–7.7)
Neutrophils Relative %: 40 %
Platelets: 281 10*3/uL (ref 150–400)
RBC: 4.13 MIL/uL (ref 3.87–5.11)
RDW: 14.6 % (ref 11.5–15.5)
WBC: 15.2 10*3/uL — ABNORMAL HIGH (ref 4.0–10.5)

## 2015-09-12 LAB — URINALYSIS, ROUTINE W REFLEX MICROSCOPIC
Bilirubin Urine: NEGATIVE
GLUCOSE, UA: NEGATIVE mg/dL
HGB URINE DIPSTICK: NEGATIVE
Ketones, ur: NEGATIVE mg/dL
Nitrite: NEGATIVE
PROTEIN: NEGATIVE mg/dL
Specific Gravity, Urine: 1.022 (ref 1.005–1.030)
pH: 6 (ref 5.0–8.0)

## 2015-09-12 LAB — COMPREHENSIVE METABOLIC PANEL
ALT: 119 U/L — AB (ref 14–54)
ANION GAP: 9 (ref 5–15)
AST: 94 U/L — AB (ref 15–41)
Albumin: 4 g/dL (ref 3.5–5.0)
Alkaline Phosphatase: 98 U/L (ref 38–126)
BILIRUBIN TOTAL: 0.4 mg/dL (ref 0.3–1.2)
BUN: 11 mg/dL (ref 6–20)
CHLORIDE: 104 mmol/L (ref 101–111)
CO2: 24 mmol/L (ref 22–32)
Calcium: 9.3 mg/dL (ref 8.9–10.3)
Creatinine, Ser: 0.68 mg/dL (ref 0.44–1.00)
Glucose, Bld: 92 mg/dL (ref 65–99)
POTASSIUM: 3.8 mmol/L (ref 3.5–5.1)
Sodium: 137 mmol/L (ref 135–145)
TOTAL PROTEIN: 7.4 g/dL (ref 6.5–8.1)

## 2015-09-12 LAB — URINE MICROSCOPIC-ADD ON: RBC / HPF: NONE SEEN RBC/hpf (ref 0–5)

## 2015-09-12 LAB — PREGNANCY, URINE: PREG TEST UR: NEGATIVE

## 2015-09-12 MED ORDER — MORPHINE SULFATE (PF) 4 MG/ML IV SOLN
4.0000 mg | Freq: Once | INTRAVENOUS | Status: AC
  Administered 2015-09-12: 4 mg via INTRAVENOUS
  Filled 2015-09-12: qty 1

## 2015-09-12 MED ORDER — IOPAMIDOL (ISOVUE-370) INJECTION 76%
100.0000 mL | Freq: Once | INTRAVENOUS | Status: AC | PRN
  Administered 2015-09-12: 100 mL via INTRAVENOUS

## 2015-09-12 NOTE — ED Notes (Signed)
PA at bedside.

## 2015-09-12 NOTE — ED Notes (Signed)
Patient reports that she is having SOB with N/V and abdominal pain x 3 weeks.

## 2015-09-12 NOTE — ED Provider Notes (Signed)
CSN: [id number]     Arrival date & time 09/12/15  2141 History   First MD Initiated Contact with Patient 09/12/15 2157     Chief Complaint  Patient presents with  . Abdominal Pain     (Consider location/radiation/quality/duration/timing/severity/associated sxs/prior Treatment) HPI Comments: Patient presents to the emergency department with chief complaints of abdominal pain and shortness of breath. She has past medical history her markable for portal vein thrombosis, and was taking Xarelto until earlier this year, when it was discontinued. She was followed by hematology/oncology. She states that over the past 3 weeks she has had progressively worsening abdominal pain. She states this feels similar to when she had the portal vein thrombosis last year. She also states that she is now having some shortness of breath. She denies any chest pain. There are no modifying factors. She denies any association with eating. She has had some associated nausea and vomiting. Additionally, she reports some dysuria.  The history is provided by the patient. No language interpreter was used.    Past Medical History  Diagnosis Date  . 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 Back surgery    . Splenectomy, total     History reviewed. No pertinent family history. Social History  Substance Use Topics  . Smoking status: Never Smoker   . Smokeless tobacco: Never Used  . Alcohol Use: No   OB History    No data available     Review of Systems  All other systems reviewed and are negative.     Allergies  Tape  Home Medications   Prior to Admission medications   Medication Sig Start Date End Date Taking? Authorizing Provider  amphetamine-dextroamphetamine (ADDERALL) 20 MG tablet Take 20 mg by mouth 2 (two) times daily.   Yes Historical Provider, MD  amoxicillin (AMOXIL) 500 MG capsule Take 1 capsule (500 mg total) by mouth 2 (two)  times daily. 03/08/15   Benjiman CoreNathan Pickering, MD  HYDROcodone-acetaminophen (HYCET) 7.5-325 mg/15 ml solution Take 15 mLs by mouth every 6 (six) hours as needed for moderate pain. 03/08/15   Benjiman CoreNathan Pickering, MD   BP 138/92 mmHg  Pulse 81  Temp(Src) 98.1 F (36.7 C) (Oral)  Resp 18  Ht 4\' 11"  (1.499 m)  Wt 79.833 kg  BMI 35.53 kg/m2  SpO2 99%  LMP 08/16/2015 Physical Exam  Constitutional: She is oriented to person, place, and time. She appears well-developed and well-nourished.  HENT:  Head: Normocephalic and atraumatic.  Eyes: Conjunctivae and EOM are normal. Pupils are equal, round, and reactive to light.  Neck: Normal range of motion. Neck supple.  Cardiovascular: Normal rate and regular rhythm.  Exam reveals no gallop and no friction rub.   No murmur heard. Pulmonary/Chest: Effort normal and breath sounds normal. No respiratory distress. She has no wheezes. She has no rales. She exhibits no tenderness.  Abdominal: Soft. Bowel sounds are normal. She exhibits no distension and no mass. There is no tenderness. There is no rebound and no guarding.  No focal abdominal tenderness, no RLQ tenderness or pain at McBurney's point, no RUQ tenderness or Murphy's sign, no left-sided abdominal tenderness, no fluid wave, or signs of peritonitis   Musculoskeletal: Normal range of motion. She exhibits no edema or tenderness.  Neurological: She is alert and oriented to person, place, and time.  Skin: Skin is warm and dry.  Psychiatric: She has a normal mood and  affect. Her behavior is normal. Judgment and thought content normal.  Nursing note and vitals reviewed.   ED Course  Procedures (including critical care time) Results for orders placed or performed during the hospital encounter of 09/12/15  Pregnancy, urine  Result Value Ref Range   Preg Test, Ur NEGATIVE NEGATIVE  Urinalysis, Routine w reflex microscopic (not at [facility])  Result Value Ref Range   Color, Urine YELLOW YELLOW   APPearance  CLOUDY (A) CLEAR   Specific Gravity, Urine 1.022 1.005 - 1.030   pH 6.0 5.0 - 8.0   Glucose, UA NEGATIVE NEGATIVE mg/dL   Hgb urine dipstick NEGATIVE NEGATIVE   Bilirubin Urine NEGATIVE NEGATIVE   Ketones, ur NEGATIVE NEGATIVE mg/dL   Protein, ur NEGATIVE NEGATIVE mg/dL   Nitrite NEGATIVE NEGATIVE   Leukocytes, UA SMALL (A) NEGATIVE  Urine microscopic-add on  Result Value Ref Range   Squamous Epithelial / LPF 6-30 (A) NONE SEEN   WBC, UA 6-30 0 - 5 WBC/hpf   RBC / HPF NONE SEEN 0 - 5 RBC/hpf   Bacteria, UA FEW (A) NONE SEEN  CBC with Differential/Platelet  Result Value Ref Range   WBC 15.2 (H) 4.0 - 10.5 K/uL   RBC 4.13 3.87 - 5.11 MIL/uL   Hemoglobin 13.0 12.0 - 15.0 g/dL   HCT 40.9 81.1 - 91.4 %   MCV 94.4 78.0 - 100.0 fL   MCH 31.5 26.0 - 34.0 pg   MCHC 33.3 30.0 - 36.0 g/dL   RDW 78.2 95.6 - 21.3 %   Platelets 281 150 - 400 K/uL   Neutrophils Relative % 40 %   Lymphocytes Relative 51 %   Monocytes Relative 7 %   Eosinophils Relative 2 %   Basophils Relative 0 %   Neutro Abs 6.1 1.7 - 7.7 K/uL   Lymphs Abs 7.7 (H) 0.7 - 4.0 K/uL   Monocytes Absolute 1.1 (H) 0.1 - 1.0 K/uL   Eosinophils Absolute 0.3 0.0 - 0.7 K/uL   Basophils Absolute 0.0 0.0 - 0.1 K/uL  Comprehensive metabolic panel  Result Value Ref Range   Sodium 137 135 - 145 mmol/L   Potassium 3.8 3.5 - 5.1 mmol/L   Chloride 104 101 - 111 mmol/L   CO2 24 22 - 32 mmol/L   Glucose, Bld 92 65 - 99 mg/dL   BUN 11 6 - 20 mg/dL   Creatinine, Ser 0.86 0.44 - 1.00 mg/dL   Calcium 9.3 8.9 - 57.8 mg/dL   Total Protein 7.4 6.5 - 8.1 g/dL   Albumin 4.0 3.5 - 5.0 g/dL   AST 94 (H) 15 - 41 U/L   ALT 119 (H) 14 - 54 U/L   Alkaline Phosphatase 98 38 - 126 U/L   Total Bilirubin 0.4 0.3 - 1.2 mg/dL   GFR calc non Af Amer >60 >60 mL/min   GFR calc Af Amer >60 >60 mL/min   Anion gap 9 5 - 15   Dg Chest 2 View  09/12/2015  CLINICAL DATA:  Shortness of breath with nausea and vomiting EXAM: CHEST  2 VIEW COMPARISON:   01/06/2015 FINDINGS: Normal heart size and mediastinal contours. No acute infiltrate or edema. No effusion or pneumothorax. Scoliosis fixation. No acute osseous findings. IMPRESSION: No active cardiopulmonary disease. Electronically Signed   By: Marnee Spring M.D.   On: 09/12/2015 22:33   Ct Angio Chest Pe W/cm &/or Wo Cm  09/13/2015  CLINICAL DATA:  Abdominal pain. History of portal vein thrombosis and septic thrombophlebitis. Leukocytosis.  EXAM: CT ANGIOGRAPHY CHEST, ABDOMEN AND PELVIS TECHNIQUE: Multidetector CT imaging through the chest, abdomen and pelvis was performed using the standard protocol during bolus administration of intravenous contrast. Multiplanar reconstructed images and MIPs were obtained and reviewed to evaluate the vascular anatomy. CONTRAST:  Omnipaque 350 IV. COMPARISON:  None. FINDINGS: CTA CHEST FINDINGS THORACIC INLET/BODY WALL: No acute abnormality. MEDIASTINUM: Normal heart size. No pericardial effusion. No evidence of pulmonary embolism. Limited systemic arterial enhancement without acute finding. No adenopathy. Normal thymus volume for age. LUNG WINDOWS: There is no edema, consolidation, effusion, or pneumothorax. OSSEOUS: Dextroscoliosis status post fixation with solid bony fusion. No acute superimposed finding. Review of the MIP images confirms the above findings. CTA ABDOMEN AND PELVIS FINDINGS Abdominal wall:  No contributory findings. Hepatobiliary: No focal liver abnormality.No evidence of biliary obstruction or stone. Pancreas: Unremarkable. Spleen: History of splenectomy with localized splenosis. Adrenals/Urinary Tract: Negative adrenals. No hydronephrosis or stone. Unremarkable bladder. Reproductive:No pathologic findings. Stomach/Bowel:  No obstruction. No appendicitis. 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 Otherwise standard aortic branching. No dissection or aneurysm. Major vessel occlusion. No mass or adenopathy.  Peritoneal: No ascites or pneumoperitoneum. Musculoskeletal: Dextroscoliosis status post fixation. Review of the MIP images confirms the above findings. IMPRESSION: 1. Negative for pulmonary embolism or other acute intrathoracic finding. 2. Negative CTA of the abdomen. 3. History of portal venous thrombosis. No venous opacification on this arterial exam. Electronically Signed   By: Marnee Spring M.D.   On: 09/13/2015 00:16   Ct Cta Abd/pel W/cm &/or W/o Cm  09/13/2015  CLINICAL DATA:  Abdominal pain. History of portal vein thrombosis and septic thrombophlebitis. Leukocytosis. EXAM: CT ANGIOGRAPHY CHEST, ABDOMEN AND PELVIS TECHNIQUE: Multidetector CT imaging through the chest, abdomen and pelvis was performed using the standard protocol during bolus administration of intravenous contrast. Multiplanar reconstructed images and MIPs were obtained and reviewed to evaluate the vascular anatomy. CONTRAST:  Omnipaque 350 IV. COMPARISON:  None. FINDINGS: CTA CHEST FINDINGS THORACIC INLET/BODY WALL: No acute abnormality. MEDIASTINUM: Normal heart size. No pericardial effusion. No evidence of pulmonary embolism. Limited systemic arterial enhancement without acute finding. No adenopathy. Normal thymus volume for age. LUNG WINDOWS: There is no edema, consolidation, effusion, or pneumothorax. OSSEOUS: Dextroscoliosis status post fixation with solid bony fusion. No acute superimposed finding. Review of the MIP images confirms the above findings. CTA ABDOMEN AND PELVIS FINDINGS Abdominal wall:  No contributory findings. Hepatobiliary: No focal liver abnormality.No evidence of biliary obstruction or stone. Pancreas: Unremarkable. Spleen: History of splenectomy with localized splenosis. Adrenals/Urinary Tract: Negative adrenals. No hydronephrosis or stone. Unremarkable bladder. Reproductive:No pathologic findings. Stomach/Bowel:  No obstruction. No appendicitis. Vascular/Lymphatic: Separate origin splenic artery and duplicated  left renal artery. Otherwise standard aortic branching. No dissection or aneurysm. Major vessel occlusion. No mass or adenopathy. Peritoneal: No ascites or pneumoperitoneum. Musculoskeletal: Dextroscoliosis status post fixation. Review of the MIP images confirms the above findings. IMPRESSION: 1. Negative for pulmonary embolism or other acute intrathoracic finding. 2. Negative CTA of the abdomen. 3. History of portal venous thrombosis. No venous opacification on this arterial exam. Electronically Signed   By: Marnee Spring M.D.   On: 09/13/2015 00:16    I have personally reviewed and evaluated these images and lab results as part of my medical decision-making.   EKG Interpretation None      MDM   Final diagnoses:  UTI (lower urinary tract infection)  Lower abdominal pain    Patient with abdominal pain and shortness of breath. Concerning history  given her prior portal vein thrombosis. She is no longer anticoagulated. Symptoms have been building for the past 3 weeks. Patient discussed with Dr. Erin Hearing, who agrees with plan that advanced imaging is indicated. Will get a CT angiogram of chest and abdomen. We'll treat patient's pain, and will reassess.  CT scans are negative for PE, or other occlusion, negative CTA of the abdomen. Urinalysis and dysuria are consistent with UTI, will treat with Keflex, and recommend close follow-up with primary care provider. Return cautions given. Patient understands agrees the plan. She is stable and ready for discharge.      Roxy Horseman, PA-C 09/13/15 1610  Marily Memos, MD 09/13/15 [phone]

## 2015-09-13 MED ORDER — HYDROCODONE-ACETAMINOPHEN 5-325 MG PO TABS: 1.0000 | ORAL_TABLET | Freq: Four times a day (QID) | ORAL | Status: DC | PRN

## 2015-09-13 MED ORDER — CEPHALEXIN 500 MG PO CAPS: 500.0000 mg | ORAL_CAPSULE | Freq: Four times a day (QID) | ORAL | Status: DC

## 2015-09-13 NOTE — Discharge Instructions (Signed)
Abdominal Pain, Adult °Many things can cause abdominal pain. Usually, abdominal pain is not caused by a disease and will improve without treatment. It can often be observed and treated at home. Your health care provider will do a physical exam and possibly order blood tests and X-rays to help determine the seriousness of your pain. However, in many cases, more time must pass before a clear cause of the pain can be found. Before that point, your health care provider may not know if you need more testing or further treatment. °HOME CARE INSTRUCTIONS °Monitor your abdominal pain for any changes. The following actions may help to alleviate any discomfort you are experiencing: °· Only take over-the-counter or prescription medicines as directed by your health care provider. °· Do not take laxatives unless directed to do so by your health care provider. °· Try a clear liquid diet (broth, tea, or water) as directed by your health care provider. Slowly move to a bland diet as tolerated. °SEEK MEDICAL CARE IF: °· You have unexplained abdominal pain. °· You have abdominal pain associated with nausea or diarrhea. °· You have pain when you urinate or have a bowel movement. °· You experience abdominal pain that wakes you in the night. °· You have abdominal pain that is worsened or improved by eating food. °· You have abdominal pain that is worsened with eating fatty foods. °· You have a fever. °SEEK IMMEDIATE MEDICAL CARE IF: °· Your pain does not go away within 2 hours. °· You keep throwing up (vomiting). °· Your pain is felt only in portions of the abdomen, such as the right side or the left lower portion of the abdomen. °· You pass bloody or black tarry stools. °MAKE SURE YOU: °· Understand these instructions. °· Will watch your condition. °· Will get help right away if you are not doing well or get worse. °  °This information is not intended to replace advice given to you by your health care provider. Make sure you discuss  any questions you have with your health care provider. °  °Document Released: 01/03/2005 Document Revised: 12/15/2014 Document Reviewed: 12/03/2012 °Elsevier Interactive Patient Education ©2016 [employer] °Urinary Tract Infection °Urinary tract infections (UTIs) can develop anywhere along your urinary tract. Your urinary tract is your body's drainage system for removing wastes and extra water. Your urinary tract includes two kidneys, two ureters, a bladder, and a urethra. Your kidneys are a pair of bean-shaped organs. Each kidney is about the size of your fist. They are located below your ribs, one on each side of your spine. °CAUSES °Infections are caused by microbes, which are microscopic organisms, including fungi, viruses, and bacteria. These organisms are so small that they can only be seen through a microscope. Bacteria are the microbes that most commonly cause UTIs. °SYMPTOMS  °Symptoms of UTIs may vary by age and gender of the patient and by the location of the infection. Symptoms in young women typically include a frequent and intense urge to urinate and a painful, burning feeling in the bladder or urethra during urination. Older women and men are more likely to be tired, shaky, and weak and have muscle aches and abdominal pain. A fever may mean the infection is in your kidneys. Other symptoms of a kidney infection include pain in your back or sides below the ribs, nausea, and vomiting. °DIAGNOSIS °To diagnose a UTI, your caregiver will ask you about your symptoms. Your caregiver will also ask you to provide a urine sample. The   urine sample will be tested for bacteria and white blood cells. White blood cells are made by your body to help fight infection. °TREATMENT  °Typically, UTIs can be treated with medication. Because most UTIs are caused by a bacterial infection, they usually can be treated with the use of antibiotics. The choice of antibiotic and length of treatment depend on your symptoms and the  type of bacteria causing your infection. °HOME CARE INSTRUCTIONS °· If you were prescribed antibiotics, take them exactly as your caregiver instructs you. Finish the medication even if you feel better after you have only taken some of the medication. °· Drink enough water and fluids to keep your urine clear or pale yellow. °· Avoid caffeine, tea, and carbonated beverages. They tend to irritate your bladder. °· Empty your bladder often. Avoid holding urine for long periods of time. °· Empty your bladder before and after sexual intercourse. °· After a bowel movement, women should cleanse from front to back. Use each tissue only once. °SEEK MEDICAL CARE IF:  °· You have back pain. °· You develop a fever. °· Your symptoms do not begin to resolve within 3 days. °SEEK IMMEDIATE MEDICAL CARE IF:  °· You have severe back pain or lower abdominal pain. °· You develop chills. °· You have nausea or vomiting. °· You have continued burning or discomfort with urination. °MAKE SURE YOU:  °· Understand these instructions. °· Will watch your condition. °· Will get help right away if you are not doing well or get worse. °  °This information is not intended to replace advice given to you by your health care provider. Make sure you discuss any questions you have with your health care provider. °  °Document Released: 01/03/2005 Document Revised: 12/15/2014 Document Reviewed: 05/04/2011 °Elsevier Interactive Patient Education ©2016 [employer] Inc. ° °

## 2015-09-14 LAB — PATHOLOGIST SMEAR REVIEW

## 2015-09-15 LAB — URINE CULTURE: Special Requests: NORMAL

## 2015-09-16 ENCOUNTER — Telehealth: Payer: Self-pay | Admitting: *Deleted

## 2015-09-16 NOTE — ED Notes (Signed)
Post ED Visit - Positive Culture Follow-up  Culture report reviewed by antimicrobial stewardship pharmacist:  []  Enzo BiNathan Batchelder, Pharm.D. []  Celedonio MiyamotoJeremy Frens, Pharm.D., BCPS []  Garvin FilaMike Maccia, Pharm.D. []  Georgina PillionElizabeth Martin, Pharm.D., BCPS []  JagualMinh Pham, [address]Ph[address]D., BCPS, AAHIVP []  Estella HuskMichelle Turner, Pharm.D., BCPS, AAHIVP [x]  Tennis Mustassie Stewart, Pharm.D. []  Sherle Poeob Vincent, [address]Ph[address]  Positive urine culture Treated with cephalexin, organism sensitive to the same and no further patient follow-up is required at this time.  Virl AxeRobertson, Sharron [place] [facility] 09/16/2015, 2:48 PM

## 2015-10-18 ENCOUNTER — Emergency Department (HOSPITAL_BASED_OUTPATIENT_CLINIC_OR_DEPARTMENT_OTHER): Payer: Medicaid Other

## 2015-10-18 ENCOUNTER — Emergency Department (HOSPITAL_BASED_OUTPATIENT_CLINIC_OR_DEPARTMENT_OTHER)
Admission: EM | Admit: 2015-10-18 | Discharge: 2015-10-18 | Disposition: A | Discharge status: Home / Self Care | Payer: Medicaid Other | Attending: Emergency Medicine | Admitting: Emergency Medicine

## 2015-10-18 ENCOUNTER — Encounter (HOSPITAL_BASED_OUTPATIENT_CLINIC_OR_DEPARTMENT_OTHER): Payer: Self-pay

## 2015-10-18 DIAGNOSIS — R8271 Bacteriuria: Secondary | ICD-10-CM

## 2015-10-18 DIAGNOSIS — D72829 Elevated white blood cell count, unspecified: Secondary | ICD-10-CM | POA: Diagnosis not present

## 2015-10-18 DIAGNOSIS — R1031 Right lower quadrant pain: Secondary | ICD-10-CM | POA: Diagnosis present

## 2015-10-18 DIAGNOSIS — R110 Nausea: Secondary | ICD-10-CM | POA: Insufficient documentation

## 2015-10-18 DIAGNOSIS — M549 Dorsalgia, unspecified: Secondary | ICD-10-CM | POA: Insufficient documentation

## 2015-10-18 DIAGNOSIS — R42 Dizziness and giddiness: Secondary | ICD-10-CM | POA: Insufficient documentation

## 2015-10-18 DIAGNOSIS — R102 Pelvic and perineal pain: Secondary | ICD-10-CM

## 2015-10-18 LAB — RAPID URINE DRUG SCREEN, HOSP PERFORMED
Amphetamines: NOT DETECTED
Barbiturates: NOT DETECTED
Benzodiazepines: NOT DETECTED
COCAINE: NOT DETECTED
OPIATES: POSITIVE — AB
TETRAHYDROCANNABINOL: POSITIVE — AB

## 2015-10-18 LAB — COMPREHENSIVE METABOLIC PANEL
ALT: 24 U/L (ref 14–54)
AST: 21 U/L (ref 15–41)
Albumin: 3.8 g/dL (ref 3.5–5.0)
Alkaline Phosphatase: 89 U/L (ref 38–126)
Anion gap: 9 (ref 5–15)
BUN: 13 mg/dL (ref 6–20)
CHLORIDE: 105 mmol/L (ref 101–111)
CO2: 24 mmol/L (ref 22–32)
CREATININE: 0.71 mg/dL (ref 0.44–1.00)
Calcium: 9 mg/dL (ref 8.9–10.3)
Glucose, Bld: 83 mg/dL (ref 65–99)
POTASSIUM: 3.2 mmol/L — AB (ref 3.5–5.1)
Sodium: 138 mmol/L (ref 135–145)
TOTAL PROTEIN: 7.6 g/dL (ref 6.5–8.1)
Total Bilirubin: 0.5 mg/dL (ref 0.3–1.2)

## 2015-10-18 LAB — URINALYSIS, ROUTINE W REFLEX MICROSCOPIC
Bilirubin Urine: NEGATIVE
Glucose, UA: NEGATIVE mg/dL
HGB URINE DIPSTICK: NEGATIVE
Ketones, ur: NEGATIVE mg/dL
Nitrite: NEGATIVE
PROTEIN: NEGATIVE mg/dL
SPECIFIC GRAVITY, URINE: 1.022 (ref 1.005–1.030)
pH: 7 (ref 5.0–8.0)

## 2015-10-18 LAB — LIPASE, BLOOD: Lipase: 18 U/L (ref 11–51)

## 2015-10-18 LAB — CBC WITH DIFFERENTIAL/PLATELET
Basophils Absolute: 0 10*3/uL (ref 0.0–0.1)
Basophils Relative: 0 %
EOS ABS: 0.2 10*3/uL (ref 0.0–0.7)
EOS PCT: 1 %
HCT: 37.2 % (ref 36.0–46.0)
Hemoglobin: 12.3 g/dL (ref 12.0–15.0)
LYMPHS ABS: 5.8 10*3/uL — AB (ref 0.7–4.0)
Lymphocytes Relative: 23 %
MCH: 31.4 pg (ref 26.0–34.0)
MCHC: 33.1 g/dL (ref 30.0–36.0)
MCV: 94.9 fL (ref 78.0–100.0)
MONOS PCT: 6 %
Monocytes Absolute: 1.5 10*3/uL — ABNORMAL HIGH (ref 0.1–1.0)
Neutro Abs: 17.4 10*3/uL — ABNORMAL HIGH (ref 1.7–7.7)
Neutrophils Relative %: 70 %
PLATELETS: 362 10*3/uL (ref 150–400)
RBC: 3.92 MIL/uL (ref 3.87–5.11)
RDW: 14.8 % (ref 11.5–15.5)
WBC: 24.8 10*3/uL — AB (ref 4.0–10.5)

## 2015-10-18 LAB — I-STAT CG4 LACTIC ACID, ED: LACTIC ACID, VENOUS: 1.39 mmol/L (ref 0.5–1.9)

## 2015-10-18 LAB — URINE MICROSCOPIC-ADD ON

## 2015-10-18 LAB — WET PREP, GENITAL
SPERM: NONE SEEN
TRICH WET PREP: NONE SEEN
Yeast Wet Prep HPF POC: NONE SEEN

## 2015-10-18 LAB — PREGNANCY, URINE: PREG TEST UR: NEGATIVE

## 2015-10-18 MED ORDER — HYDROMORPHONE HCL 1 MG/ML IJ SOLN
0.5000 mg | Freq: Once | INTRAMUSCULAR | Status: AC
  Administered 2015-10-18: 0.5 mg via INTRAVENOUS
  Filled 2015-10-18: qty 1

## 2015-10-18 MED ORDER — SODIUM CHLORIDE 0.9 % IV BOLUS (SEPSIS)
1000.0000 mL | Freq: Once | INTRAVENOUS | Status: AC
  Administered 2015-10-18: 1000 mL via INTRAVENOUS

## 2015-10-18 MED ORDER — CEPHALEXIN 500 MG PO CAPS: 500.0000 mg | ORAL_CAPSULE | Freq: Four times a day (QID) | ORAL | Status: DC

## 2015-10-18 MED ORDER — IOPAMIDOL (ISOVUE-300) INJECTION 61%
100.0000 mL | Freq: Once | INTRAVENOUS | Status: AC | PRN
  Administered 2015-10-18: 100 mL via INTRAVENOUS

## 2015-10-18 MED ORDER — DEXTROSE 5 % IV SOLN
1.0000 g | Freq: Once | INTRAVENOUS | Status: AC
  Administered 2015-10-18: 1 g via INTRAVENOUS
  Filled 2015-10-18: qty 10

## 2015-10-18 MED ORDER — METOCLOPRAMIDE HCL 5 MG/ML IJ SOLN
5.0000 mg | Freq: Once | INTRAMUSCULAR | Status: AC
  Administered 2015-10-18: 5 mg via INTRAVENOUS
  Filled 2015-10-18: qty 2

## 2015-10-18 MED ORDER — HYDROMORPHONE HCL 1 MG/ML IJ SOLN
1.0000 mg | Freq: Once | INTRAMUSCULAR | Status: AC
  Administered 2015-10-18: 1 mg via INTRAVENOUS
  Filled 2015-10-18: qty 1

## 2015-10-18 NOTE — ED Provider Notes (Signed)
CSN: [id number]     Arrival date & time 10/18/15  1613 History  By signing my name below, I, Carol [name], attest that this documentation has been prepared under the direction and in the presence of Arthor Captain, PA-C. Electronically Signed: Phillis [name], ED Scribe. 10/18/2015. 4:39 PM.   Chief Complaint  Patient presents with  . Abdominal Pain   The history is provided by the patient. No language interpreter was used.  HPI Comments: Carol [name] is a 20 y.o. Female with a hx of portal vein thrombosis and splenectomy who presents to the Emergency Department complaining of gradually worsening, severe, lower abdominal pain onset 3 days ago. Pt reports associated lower back pain, lightheadedness, diaphoresis, and nausea. She reports worsening pain with walking, talking, and deep breathing. Pt states that this pain feels similar to when she had portal vein thrombosis, but much worse. She was never given a specific cause for her blood clot. Pt has since stopped using BC after the blood clot, but uses condoms regularly. Pt states that her sister was recently diagnosed with a rare blood disorder after having a blood clot. Pt's last BM was this morning and was normal. Pt is not on anti-coagulants. She denies fever, vomiting, diarrhea, vaginal bleeding, vaginal discharge, dysuria, hematuria, frequency, urgency, or malodorous urine. LMP 09/22/15.   Past Medical History  Diagnosis Date  . 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 Back surgery    . Splenectomy, total     No family history on file. Social History  Substance Use Topics  . Smoking status: Never Smoker   . Smokeless tobacco: Never Used  . Alcohol Use: No   OB History    No data available     Review of Systems  Constitutional: Positive for diaphoresis. Negative for fever.  Gastrointestinal: Positive for nausea and abdominal pain. Negative for vomiting and  diarrhea.  Genitourinary: Negative for dysuria, urgency, frequency, hematuria, vaginal bleeding and vaginal discharge.  Musculoskeletal: Positive for back pain.  Neurological: Positive for light-headedness.  All other systems reviewed and are negative.  Allergies  Tape  Home Medications   Prior to Admission medications   Medication Sig Start Date End Date Taking? Authorizing Provider  amphetamine-dextroamphetamine (ADDERALL) 20 MG tablet Take 20 mg by mouth 2 (two) times daily.    Historical Provider, MD   BP 138/85 mmHg  Pulse 98  Temp(Src) 98.8 F (37.1 C) (Oral)  Resp 18  Ht 4\' 11"  (1.499 m)  Wt 175 lb (79.379 kg)  BMI 35.33 kg/m2  SpO2 100%  LMP 09/23/2015 Physical Exam  Constitutional: She is oriented to person, place, and time. She appears well-developed and well-nourished.  HENT:  Head: Normocephalic and atraumatic.  Eyes: EOM are normal. Pupils are equal, round, and reactive to light.  Neck: Normal range of motion. Neck supple.  Cardiovascular: Normal rate, regular rhythm and normal heart sounds.  Exam reveals no gallop and no friction rub.   No murmur heard. Pulmonary/Chest: Effort normal and breath sounds normal. She has no wheezes.  Abdominal: Soft. There is tenderness in the right lower quadrant and left lower quadrant. There is CVA tenderness.  Bilateral lower quadrant abdominal tenderness; bilateral CVA tenderness  Musculoskeletal: Normal range of motion.  Neurological: She is alert and oriented to person, place, and time.  Skin: Skin is warm and dry.  Psychiatric: She has a normal mood and affect. Her behavior is  normal.  Nursing note and vitals reviewed.   ED Course  Procedures (including critical care time) DIAGNOSTIC STUDIES: Oxygen Saturation is 100% on RA, normal by my interpretation.    COORDINATION OF CARE: 4:32 PM-Discussed treatment plan which includes labs with pt at bedside and pt agreed to plan.   Labs Review Labs Reviewed  WET PREP,  GENITAL - Abnormal; Notable for the following:    Clue Cells Wet Prep HPF POC PRESENT (*)    WBC, Wet Prep HPF POC MANY (*)    All other components within normal limits  URINALYSIS, ROUTINE W REFLEX MICROSCOPIC (NOT AT [facility]) - Abnormal; Notable for the following:    APPearance CLOUDY (*)    Leukocytes, UA SMALL (*)    All other components within normal limits  CBC WITH DIFFERENTIAL/PLATELET - Abnormal; Notable for the following:    WBC 24.8 (*)    Neutro Abs 17.4 (*)    Lymphs Abs 5.8 (*)    Monocytes Absolute 1.5 (*)    All other components within normal limits  COMPREHENSIVE METABOLIC PANEL - Abnormal; Notable for the following:    Potassium 3.2 (*)    All other components within normal limits  URINE MICROSCOPIC-ADD ON - Abnormal; Notable for the following:    Squamous Epithelial / LPF 6-30 (*)    Bacteria, UA MANY (*)    All other components within normal limits  URINE RAPID DRUG SCREEN, HOSP PERFORMED - Abnormal; Notable for the following:    Opiates POSITIVE (*)    Tetrahydrocannabinol POSITIVE (*)    All other components within normal limits  CULTURE, BLOOD (ROUTINE X 2)  CULTURE, BLOOD (ROUTINE X 2)  URINE CULTURE  PREGNANCY, URINE  LIPASE, BLOOD  CBC WITH DIFFERENTIAL/PLATELET  I-STAT CG4 LACTIC ACID, ED  GC/CHLAMYDIA PROBE AMP ([place]) NOT AT [facility]    Imaging Review Ct Abdomen Pelvis W Contrast  10/18/2015  CLINICAL DATA:  Bilateral abdominal pain EXAM: CT ABDOMEN AND PELVIS WITH CONTRAST TECHNIQUE: Multidetector CT imaging of the abdomen and pelvis was performed using the standard protocol following bolus administration of intravenous contrast. CONTRAST:  ISOVUE-300 IOPAMIDOL (ISOVUE-300) INJECTION 61% COMPARISON:  None. FINDINGS: Lower chest:  No acute findings. Hepatobiliary: No masses or other significant abnormality. Pancreas: No mass, inflammatory changes, or other significant abnormality. Spleen: Within normal limits in size and appearance.  Adrenals/Urinary Tract: No masses identified. No evidence of hydronephrosis. Stomach/Bowel: No evidence of obstruction, inflammatory process, or abnormal fluid collections. Normal appendix. Vascular/Lymphatic: No pathologically enlarged lymph nodes. No evidence of abdominal aortic aneurysm. Reproductive: No mass or other significant abnormality. Other: None. Musculoskeletal: No suspicious bone lesions identified. Dextroscoliosis of the thoracic spine. IMPRESSION: 1. No acute abdominal or pelvic pathology. Electronically Signed   By: Elige Ko   On: 10/18/2015 19:08   I have personally reviewed and evaluated these images and lab results as part of my medical decision-making.   EKG Interpretation None      MDM   Patient is nontoxic, nonseptic appearing, in no apparent distress.  Patient's pain and other symptoms adequately managed in emergency department.  Fluid bolus given.  Labs, imaging and vitals reviewed.  Patient does not meet the SIRS or Sepsis criteria.  On repeat exam patient does not have a surgical abdomin and there are no peritoneal signs.  No indication of appendicitis, bowel obstruction, bowel perforation, cholecystitis, diverticulitis, PID or ectopic pregnancy.  Patient discharged home with symptomatic treatment and given strict instructions for follow-up with their primary  care physician.  I have also discussed reasons to return immediately to the ER.  Patient expresses understanding and agrees with plan.  Final diagnoses:  None    6:37 PM BP 138/85 mmHg  Pulse 98  Temp(Src) 98.8 F (37.1 C) (Oral)  Resp 18  Ht 4\' 11"  (1.499 m)  Wt 79.379 kg  BMI 35.33 kg/m2  SpO2 100%  LMP 09/23/2015 Pelvic exam: normal external genitalia, vulva, vagina, cervix, uterus and adnexa.   Patient with negative CT scan. Her urine is equivocal, however I suspect UTI as the issue b/c of clinical appearance. Suprapubic, and BL flank pain. Given rocephin IV and will be d/c with keflex. Pain  improved. SHe appears safe for discharge at this time. Discussed return precautions.   I personally performed the services described in this documentation, which was scribed in my presence. The recorded information has been reviewed and is accurate.  d   Arthor Captainbigail Tadeo Besecker, PA-C 10/21/15 1951  Melene Planan Floyd, DO 10/21/15 1954

## 2015-10-18 NOTE — ED Notes (Signed)
Pt verbalizes understanding of d/c instructions and denies any further need at this time. 

## 2015-10-18 NOTE — ED Notes (Addendum)
C/o abd pain, lower back pain x 3 days-denies v/d-NAD-steady gait

## 2015-10-18 NOTE — ED Notes (Signed)
Pt reports bilat lower abd pain which started last night, endorses nausea, denies vomiting. States the pain feels like someone is stabbing her and radiates into her back. Denies urinary symptoms, vaginal discharge or bleeding.

## 2015-10-18 NOTE — Discharge Instructions (Signed)
Abdominal (belly) pain can be caused by many things. Your caregiver performed an examination and possibly ordered blood/urine tests and imaging (CT scan, x-rays, ultrasound). Many cases can be observed and treated at home after initial evaluation in the emergency department. Even though you are being discharged home, abdominal pain can be unpredictable. Therefore, you need a repeated exam if your pain does not resolve, returns, or worsens. Most patients with abdominal pain don't have to be admitted to the hospital or have surgery, but serious problems like appendicitis and gallbladder attacks can start out as nonspecific pain. Many abdominal conditions cannot be diagnosed in one visit, so follow-up evaluations are very important. SEEK IMMEDIATE MEDICAL ATTENTION IF: The pain does not go away or becomes severe.  A temperature above 101 develops.  Repeated vomiting occurs (multiple episodes).  The pain becomes localized to portions of the abdomen. The right side could possibly be appendicitis. In an adult, the left lower portion of the abdomen could be colitis or diverticulitis.  Blood is being passed in stools or vomit (bright red or black tarry stools).  Return also if you develop chest pain, difficulty breathing, dizziness or fainting, or become confused, poorly responsive, or inconsolable (young children). Urinary Tract Infection Urinary tract infections (UTIs) can develop anywhere along your urinary tract. Your urinary tract is your body's drainage system for removing wastes and extra water. Your urinary tract includes two kidneys, two ureters, a bladder, and a urethra. Your kidneys are a pair of bean-shaped organs. Each kidney is about the size of your fist. They are located below your ribs, one on each side of your spine. CAUSES Infections are caused by microbes, which are microscopic organisms, including fungi, viruses, and bacteria. These organisms are so small that they can only be seen through a  microscope. Bacteria are the microbes that most commonly cause UTIs. SYMPTOMS  Symptoms of UTIs may vary by age and gender of the patient and by the location of the infection. Symptoms in young women typically include a frequent and intense urge to urinate and a painful, burning feeling in the bladder or urethra during urination. Older women and men are more likely to be tired, shaky, and weak and have muscle aches and abdominal pain. A fever may mean the infection is in your kidneys. Other symptoms of a kidney infection include pain in your back or sides below the ribs, nausea, and vomiting. DIAGNOSIS To diagnose a UTI, your caregiver will ask you about your symptoms. Your caregiver will also ask you to provide a urine sample. The urine sample will be tested for bacteria and white blood cells. White blood cells are made by your body to help fight infection. TREATMENT  Typically, UTIs can be treated with medication. Because most UTIs are caused by a bacterial infection, they usually can be treated with the use of antibiotics. The choice of antibiotic and length of treatment depend on your symptoms and the type of bacteria causing your infection. HOME CARE INSTRUCTIONS  If you were prescribed antibiotics, take them exactly as your caregiver instructs you. Finish the medication even if you feel better after you have only taken some of the medication.  Drink enough water and fluids to keep your urine clear or pale yellow.  Avoid caffeine, tea, and carbonated beverages. They tend to irritate your bladder.  Empty your bladder often. Avoid holding urine for long periods of time.  Empty your bladder before and after sexual intercourse.  After a bowel movement, women should cleanse  from front to back. Use each tissue only once. SEEK MEDICAL CARE IF:   You have back pain.  You develop a fever.  Your symptoms do not begin to resolve within 3 days. SEEK IMMEDIATE MEDICAL CARE IF:   You have severe  back pain or lower abdominal pain.  You develop chills.  You have nausea or vomiting.  You have continued burning or discomfort with urination. MAKE SURE YOU:   Understand these instructions.  Will watch your condition.  Will get help right away if you are not doing well or get worse.   This information is not intended to replace advice given to you by your health care provider. Make sure you discuss any questions you have with your health care provider.   Document Released: 01/03/2005 Document Revised: 12/15/2014 Document Reviewed: 05/04/2011 Elsevier Interactive Patient Education [employer]

## 2015-10-19 LAB — GC/CHLAMYDIA PROBE AMP (~~LOC~~) NOT AT ARMC
Chlamydia: NEGATIVE
NEISSERIA GONORRHEA: NEGATIVE

## 2015-10-21 LAB — URINE CULTURE

## 2015-10-22 ENCOUNTER — Telehealth: Payer: Self-pay | Admitting: *Deleted

## 2015-10-22 NOTE — Telephone Encounter (Signed)
Post ED Visit - Positive Culture Follow-up  Culture report reviewed by antimicrobial stewardship pharmacist:  []  Enzo BiNathan Batchelder, Pharm.D. []  Celedonio MiyamotoJeremy Frens, Pharm.D., BCPS []  Garvin FilaMike Maccia, Pharm.D. []  Georgina PillionElizabeth Martin, Pharm.D., BCPS []  PearlingtonMinh Pham, [address]Pharm[address]D., BCPS, AAHIVP []  Estella HuskMichelle Turner, Pharm.D., BCPS, AAHIVP []  Tennis Mustassie Stewart, Pharm.D. [x]  Sherle Poeob Vincent, [address]Pharm[address]  Positive urine culture Treated with Cephalexin, organism sensitive to the same and no further patient follow-up is required at this time.  Virl AxeRobertson, Yader Criger [facility] 10/22/2015, 1:07 PM

## 2015-10-24 LAB — CULTURE, BLOOD (ROUTINE X 2)
CULTURE: NO GROWTH
CULTURE: NO GROWTH

## 2015-11-06 ENCOUNTER — Emergency Department (HOSPITAL_BASED_OUTPATIENT_CLINIC_OR_DEPARTMENT_OTHER): Payer: Medicaid Other

## 2015-11-06 ENCOUNTER — Emergency Department (HOSPITAL_BASED_OUTPATIENT_CLINIC_OR_DEPARTMENT_OTHER)
Admission: EM | Admit: 2015-11-06 | Discharge: 2015-11-06 | Disposition: A | Discharge status: Home / Self Care | Payer: Medicaid Other | Attending: Emergency Medicine | Admitting: Emergency Medicine

## 2015-11-06 DIAGNOSIS — S93401A Sprain of unspecified ligament of right ankle, initial encounter: Secondary | ICD-10-CM | POA: Insufficient documentation

## 2015-11-06 DIAGNOSIS — Y999 Unspecified external cause status: Secondary | ICD-10-CM | POA: Insufficient documentation

## 2015-11-06 DIAGNOSIS — W208XXA Other cause of strike by thrown, projected or falling object, initial encounter: Secondary | ICD-10-CM | POA: Insufficient documentation

## 2015-11-06 DIAGNOSIS — Y92832 Beach as the place of occurrence of the external cause: Secondary | ICD-10-CM | POA: Diagnosis not present

## 2015-11-06 DIAGNOSIS — Y939 Activity, unspecified: Secondary | ICD-10-CM | POA: Diagnosis not present

## 2015-11-06 DIAGNOSIS — S99911A Unspecified injury of right ankle, initial encounter: Secondary | ICD-10-CM | POA: Diagnosis present

## 2015-11-06 NOTE — Discharge Instructions (Signed)
Please read and follow all provided instructions.  Your diagnoses today include:  1. Ankle sprain, right, initial encounter     Tests performed today include: An x-ray of your ankle - does NOT show any broken bones Vital signs. See below for your results today.   Medications prescribed:  Ibuprofen (Motrin, Advil) - anti-inflammatory pain medication Do not exceed 600mg  ibuprofen every 6 hours, take with food  You have been prescribed an anti-inflammatory medication or NSAID. Take with food. Take smallest effective dose for the shortest duration needed for your pain. Stop taking if you experience stomach pain or vomiting.   Take any prescribed medications only as directed.  Home care instructions:  Follow any educational materials contained in this packet Follow R.I.C.E. Protocol: R - rest your injury  I  - use ice on injury without applying directly to skin C - compress injury with bandage or splint E - elevate the injury as much as possible  Follow-up instructions: Please follow-up with the provided orthopedic (bone specialist) for further care.   Return instructions:  Please return if your toes are numb or tingling, appear gray or blue, or you have severe pain (also elevate leg and loosen splint or wrap) Please return to the Emergency Department if you experience worsening symptoms.  Please return if you have any other emergent concerns.  Additional Information:  Your vital signs today were: BP 133/76 (BP Location: Left Arm)    Pulse 65    Temp 98.3 F (36.8 C) (Oral)    Resp 18    Ht 4\' 11"  (1.499 m)    Wt 79.4 kg    LMP 10/22/2015    SpO2 100%    BMI 35.35 kg/m  If your blood pressure (BP) was elevated above 135/85 this visit, please have this repeated by your doctor within one month. -------------- Your caregiver has diagnosed you as suffering from an ankle sprain. Ankle sprain occurs when the ligaments that hold the ankle joint together are stretched or torn. It may  take 4 to 6 weeks to heal. --------------

## 2015-11-06 NOTE — ED Triage Notes (Signed)
Right ankle sprain x 2 weeks.  Was seen by PCP and given a brace.  Reports pain has progressed.  Denies xrays at PCP

## 2015-11-06 NOTE — ED Triage Notes (Signed)
Pt ambulatory.

## 2015-11-06 NOTE — ED Provider Notes (Signed)
[facility] Note   CSN: [id number] Arrival date & time: 11/06/15  1836  First Provider Contact:   First MD Initiated Contact with Patient 11/06/15 1956     By signing my name below, I, Evon Slack, attest that this documentation has been prepared under the direction and in the presence of Renne Crigler, PA-C. Electronically Signed: Evon Slack, ED Scribe. 11/06/15. 8:14 PM.   History   Chief Complaint Chief Complaint  Patient presents with  . Ankle Injury    HPI Carol [name] is a 20 y.o. female.  The history is provided by the patient. No language interpreter was used.   HPI Comments: Carol [name] is a 20 y.o. female who presents to the Emergency Department complaining of right ankle injury onset 2 weeks prior. [place] and [place] her [place]. She presents with associated swelling. Pt state that that she saw her PCP for same injury and was given a brace but has not had any relief. Pt states that she has been resting, icing and elevating the ankle with no relief. Pt states she has tried ibuprofen with no pain relief. Pt states that the pain is worse when ambulating. Pt doesn't report numbness or tingling.   Past Medical History:  Diagnosis Date  . Portal vein thrombosis 4/16   R portal vein blood clot    Patient Active Problem List   Diagnosis Date Noted  . CMV mononucleosis 11/27/2014  . Cytomegalovirus (HCC)   . Septic thrombophlebitis   . 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 Normocytic anemia 07/21/2014  . Obesity 07/21/2014  . Elevated liver enzymes 07/21/2014  . Rash 07/21/2014  . Sepsis (HCC) 07/20/2014  . Leukocytosis 07/20/2014  . Nausea and vomiting 07/20/2014  . Blood poisoning (HCC)   . Abdominal pain 07/19/2014  . UTI (urinary tract infection) 07/19/2014  . Portal vein thrombosis 07/19/2014    Past Surgical History:  Procedure Laterality Date  .  BACK SURGERY    . SPLENECTOMY, TOTAL      OB History    No data available       Home Medications    Prior to Admission medications   Medication Sig Start Date End Date Taking? Authorizing Provider  amphetamine-dextroamphetamine (ADDERALL) 20 MG tablet Take 20 mg by mouth 2 (two) times daily.    Historical Provider, MD    Family History No family history on file.  Social History Social History  Substance Use Topics  . Smoking status: Never Smoker  . Smokeless tobacco: Never Used  . Alcohol use No     Allergies   Tape   Review of Systems Review of Systems  Constitutional: Negative for activity change.  Musculoskeletal: Positive for arthralgias, gait problem and joint swelling. Negative for back pain and neck pain.  Skin: Negative for wound.  Neurological: Negative for weakness and numbness.     Physical Exam Updated Vital Signs BP 133/76 (BP Location: Left Arm)   Pulse 65   Temp 98.3 F (36.8 C) (Oral)   Resp 18   Ht  (1.499 m)   Wt 175 lb (79.4 kg)   LMP 10/22/2015   SpO2 100%   BMI 35.35 kg/m   Physical Exam  Constitutional: She appears well-developed and well-nourished. No distress.  HENT:  Head: Normocephalic and atraumatic.  Eyes: Conjunctivae and EOM are normal.  Neck: Normal range of motion. Neck supple. No tracheal deviation present.  Cardiovascular:  Normal rate.   Pulses:      Dorsalis pedis pulses are 2+ on the right side, and 2+ on the left side.       Posterior tibial pulses are 2+ on the right side, and 2+ on the left side.  Pulmonary/Chest: Effort normal. No respiratory distress.  Musculoskeletal: She exhibits edema and tenderness.       Right knee: Normal.       Right ankle: She exhibits decreased range of motion and swelling. She exhibits no ecchymosis, no deformity, no laceration and normal pulse. Tenderness. Lateral malleolus tenderness found. No medial malleolus, no head of 5th metatarsal and no proximal fibula tenderness  found.       Right lower leg: Normal.       Legs:      Right foot: Normal.  Neurological: She is alert.  Distal motor, sensation, and vascular intact.   Skin: Skin is warm and dry.  Psychiatric: She has a normal mood and affect. Her behavior is normal.  Nursing note and vitals reviewed.    ED Treatments / Results  DIAGNOSTIC STUDIES: Oxygen Saturation is 100% on RA, normal by my interpretation.    COORDINATION OF CARE: 8:14 PM-Discussed treatment plan which includes right ankle x-ray and sports medicine follow up with pt at bedside and pt agreed to plan.     Labs (all labs ordered are listed, but only abnormal results are displayed) Labs Reviewed - No data to display  EKG  EKG Interpretation None       Radiology Dg Ankle Complete Right  Result Date: 11/06/2015 CLINICAL DATA:  Right ankle sprain. EXAM: RIGHT ANKLE - COMPLETE 3+ VIEW COMPARISON:  None. FINDINGS: There is no evidence of fracture, dislocation, or joint effusion. There is no evidence of arthropathy or other focal bone abnormality. Soft tissues demonstrates lateral ankle swelling. IMPRESSION: No acute fracture or dislocation identified about the right ankle. Lateral ankle soft tissue swelling. Electronically Signed   By: Ted Mcalpine M.D.   On: 11/06/2015 19:15   Procedures Procedures (including critical care time)  Medications Ordered in ED Medications - No data to display   Initial Impression / Assessment and Plan / ED Course  I have reviewed the triage vital signs and the nursing notes.  Pertinent labs & imaging results that were available during my care of the patient were reviewed by me and considered in my medical decision making (see chart for details).  Clinical Course   Patient was counseled on RICE protocol and told to rest injury, use ice for no longer than 15 minutes every hour, compress the area, and elevate above the level of their heart as much as possible to reduce swelling.  Discussed use of NSAIDs. Questions answered. Patient verbalized understanding.     Final Clinical Impressions(s) / ED Diagnoses   Final diagnoses:  Ankle sprain, right, initial encounter   Patient with continued pain after ankle injury 2 weeks ago. There is laxity of the ankle. Suspect higher grade ankle sprain. Sports medicine follow-up given. Patient to continue to use brace and NSAIDs, Rice protocol for supportive care.  New Prescriptions New Prescriptions   No medications on file   I personally performed the services described in this documentation, which was scribed in my presence. The recorded information has been reviewed and is accurate.     Renne Crigler, PA-C 11/06/15 [id number]    Jacalyn Lefevre, MD 11/06/15 2135

## 2015-11-07 ENCOUNTER — Ambulatory Visit: Payer: Medicaid Other | Admitting: Family Medicine

## 2015-11-09 ENCOUNTER — Encounter: Payer: Self-pay | Admitting: Family Medicine

## 2015-11-09 ENCOUNTER — Ambulatory Visit (INDEPENDENT_AMBULATORY_CARE_PROVIDER_SITE_OTHER): Payer: Medicaid Other | Admitting: Family Medicine

## 2015-11-09 DIAGNOSIS — S99911A Unspecified injury of right ankle, initial encounter: Secondary | ICD-10-CM

## 2015-11-09 MED ORDER — HYDROCODONE-ACETAMINOPHEN 5-325 MG PO TABS: 1.0000 | ORAL_TABLET | Freq: Four times a day (QID) | ORAL | 0 refills | Status: AC | PRN

## 2015-11-09 MED ORDER — DICLOFENAC SODIUM 75 MG PO TBEC: 75.0000 mg | DELAYED_RELEASE_TABLET | Freq: Two times a day (BID) | ORAL | 1 refills | Status: AC

## 2015-11-09 NOTE — Patient Instructions (Addendum)
You have an ankle sprain and peroneal tendon strain. Ice the area for 15 minutes at a time, 3-4 times a day Diclofenac 75mg  twice a day with food for pain and inflammation. Norco as needed for severe pain (no driving on this medicine). Elevate above the level of your heart when possible Crutches if needed to help with walking Bear weight when tolerated Consider cam walker with arch supports (our scaphoid pads or dr scholls active series). Come out of the boot/brace twice a day to do Up/down and alphabet exercises 2-3 sets of each. Consider physical therapy for strengthening and balance exercises in the future. If not improving as expected, we may repeat x-rays or consider further testing like an MRI. Follow up with me in 2 weeks for reevaluation.

## 2015-11-12 ENCOUNTER — Emergency Department (HOSPITAL_BASED_OUTPATIENT_CLINIC_OR_DEPARTMENT_OTHER): Payer: Medicaid Other

## 2015-11-12 ENCOUNTER — Emergency Department (HOSPITAL_BASED_OUTPATIENT_CLINIC_OR_DEPARTMENT_OTHER)
Admission: EM | Admit: 2015-11-12 | Discharge: 2015-11-13 | Disposition: A | Discharge status: Home / Self Care | Payer: Medicaid Other | Attending: Emergency Medicine | Admitting: Emergency Medicine

## 2015-11-12 ENCOUNTER — Encounter (HOSPITAL_BASED_OUTPATIENT_CLINIC_OR_DEPARTMENT_OTHER): Payer: Self-pay

## 2015-11-12 DIAGNOSIS — R109 Unspecified abdominal pain: Secondary | ICD-10-CM | POA: Insufficient documentation

## 2015-11-12 DIAGNOSIS — R531 Weakness: Secondary | ICD-10-CM | POA: Insufficient documentation

## 2015-11-12 DIAGNOSIS — R42 Dizziness and giddiness: Secondary | ICD-10-CM | POA: Insufficient documentation

## 2015-11-12 DIAGNOSIS — R0602 Shortness of breath: Secondary | ICD-10-CM | POA: Diagnosis not present

## 2015-11-12 DIAGNOSIS — R079 Chest pain, unspecified: Secondary | ICD-10-CM | POA: Insufficient documentation

## 2015-11-12 DIAGNOSIS — R110 Nausea: Secondary | ICD-10-CM | POA: Insufficient documentation

## 2015-11-12 HISTORY — DX: Budd-Chiari syndrome: I82.0

## 2015-11-12 HISTORY — DX: Other specified problems related to primary support group: Z63.8

## 2015-11-12 LAB — URINALYSIS, ROUTINE W REFLEX MICROSCOPIC
Bilirubin Urine: NEGATIVE
GLUCOSE, UA: NEGATIVE mg/dL
Hgb urine dipstick: NEGATIVE
KETONES UR: NEGATIVE mg/dL
Leukocytes, UA: NEGATIVE
Nitrite: NEGATIVE
PH: 7 (ref 5.0–8.0)
Protein, ur: NEGATIVE mg/dL
SPECIFIC GRAVITY, URINE: 1.021 (ref 1.005–1.030)

## 2015-11-12 LAB — CBC WITH DIFFERENTIAL/PLATELET
BASOS PCT: 0 %
Basophils Absolute: 0 10*3/uL (ref 0.0–0.1)
EOS ABS: 0.3 10*3/uL (ref 0.0–0.7)
EOS PCT: 2 %
HEMATOCRIT: 36 % (ref 36.0–46.0)
Hemoglobin: 11.9 g/dL — ABNORMAL LOW (ref 12.0–15.0)
LYMPHS PCT: 52 %
Lymphs Abs: 8.1 10*3/uL — ABNORMAL HIGH (ref 0.7–4.0)
MCH: 31.6 pg (ref 26.0–34.0)
MCHC: 33.1 g/dL (ref 30.0–36.0)
MCV: 95.5 fL (ref 78.0–100.0)
Monocytes Absolute: 1.6 10*3/uL — ABNORMAL HIGH (ref 0.1–1.0)
Monocytes Relative: 10 %
NEUTROS ABS: 5.7 10*3/uL (ref 1.7–7.7)
Neutrophils Relative %: 36 %
Platelets: 302 10*3/uL (ref 150–400)
RBC: 3.77 MIL/uL — ABNORMAL LOW (ref 3.87–5.11)
RDW: 15.5 % (ref 11.5–15.5)
WBC: 15.7 10*3/uL — ABNORMAL HIGH (ref 4.0–10.5)

## 2015-11-12 LAB — TROPONIN I

## 2015-11-12 LAB — COMPREHENSIVE METABOLIC PANEL
ALBUMIN: 4.1 g/dL (ref 3.5–5.0)
ALK PHOS: 77 U/L (ref 38–126)
ALT: 33 U/L (ref 14–54)
ANION GAP: 7 (ref 5–15)
AST: 32 U/L (ref 15–41)
BILIRUBIN TOTAL: 0.4 mg/dL (ref 0.3–1.2)
BUN: 23 mg/dL — ABNORMAL HIGH (ref 6–20)
CALCIUM: 9.3 mg/dL (ref 8.9–10.3)
CO2: 23 mmol/L (ref 22–32)
Chloride: 109 mmol/L (ref 101–111)
Creatinine, Ser: 0.76 mg/dL (ref 0.44–1.00)
Glucose, Bld: 87 mg/dL (ref 65–99)
POTASSIUM: 4 mmol/L (ref 3.5–5.1)
Sodium: 139 mmol/L (ref 135–145)
TOTAL PROTEIN: 7.6 g/dL (ref 6.5–8.1)

## 2015-11-12 LAB — PREGNANCY, URINE: PREG TEST UR: NEGATIVE

## 2015-11-12 LAB — D-DIMER, QUANTITATIVE (NOT AT ARMC)

## 2015-11-12 LAB — CBG MONITORING, ED: GLUCOSE-CAPILLARY: 86 mg/dL (ref 65–99)

## 2015-11-12 MED ORDER — SODIUM CHLORIDE 0.9 % IV SOLN
INTRAVENOUS | Status: DC
  Administered 2015-11-12: 20:00:00 via INTRAVENOUS

## 2015-11-12 MED ORDER — ONDANSETRON HCL 4 MG/2ML IJ SOLN
4.0000 mg | Freq: Once | INTRAMUSCULAR | Status: AC | PRN
  Administered 2015-11-12: 4 mg via INTRAVENOUS
  Filled 2015-11-12: qty 2

## 2015-11-12 MED ORDER — METOCLOPRAMIDE HCL 5 MG/ML IJ SOLN
10.0000 mg | Freq: Once | INTRAMUSCULAR | Status: AC
  Administered 2015-11-12: 10 mg via INTRAVENOUS
  Filled 2015-11-12: qty 2

## 2015-11-12 MED ORDER — KETOROLAC TROMETHAMINE 30 MG/ML IJ SOLN
30.0000 mg | Freq: Once | INTRAMUSCULAR | Status: AC
  Administered 2015-11-12: 30 mg via INTRAVENOUS
  Filled 2015-11-12: qty 1

## 2015-11-12 MED ORDER — SODIUM CHLORIDE 0.9 % IV BOLUS (SEPSIS)
1000.0000 mL | Freq: Once | INTRAVENOUS | Status: AC
  Administered 2015-11-12: 1000 mL via INTRAVENOUS

## 2015-11-12 MED ORDER — IOPAMIDOL (ISOVUE-370) INJECTION 76%
100.0000 mL | Freq: Once | INTRAVENOUS | Status: AC | PRN
  Administered 2015-11-12: 100 mL via INTRAVENOUS

## 2015-11-12 NOTE — ED Notes (Signed)
Resting/sleeping with s.o. In stretcher, "feel better", denies nausea.

## 2015-11-12 NOTE — ED Notes (Signed)
Back from CT, alert, NAD, calm, interactive, no changes.  

## 2015-11-12 NOTE — ED Provider Notes (Signed)
[facility] Provider Note   CSN: [id number] Arrival date & time: 11/12/15  1454  First Provider Contact:  First MD Initiated Contact with Patient 11/12/15 1739     By signing my name below, I, Vista Mink, attest that this documentation has been prepared under the direction and in the presence of [employer].  Electronically Signed: Vista Mink, ED Scribe. 11/12/15. 6:13 PM.   History   Chief Complaint Chief Complaint  Patient presents with  . Flank Pain    HPI HPI Comments: Carol [name] is a 20 y.o. Female with a PMHx of Portal vein thrombosis, spinal fusion, UTI, who presents to the Emergency Department complaining of sudden onset, constant, sharp right sided flank pain that started yesterday night. Pt states she was laying down in bed last night when the pain started. Pt reports that she passed out at 0200 when her pain worsened. Pt also reports dizziness last night; that felt like the room was spinning. Pt reports chest pain and shortness of breath described as "something sitting on my chest". Pt further reports generalized weakness and nausea today. Pt denies any recent change in medications. Pt denies urinary symptoms. Pt denies diarrhea, constipation, vomiting. No modifying or aggravating factors.  No prior treatment.       The history is provided by the patient. No language interpreter was used.    Past Medical History:  Diagnosis Date  . Budd-Chiari syndrome (HCC)   . Other family problem    Sibling has Antiphospholipid Syndrome  . Portal vein thrombosis 4/16   R portal vein blood clot    Patient Active Problem List   Diagnosis Date Noted  . CMV mononucleosis 11/27/2014  . Cytomegalovirus (HCC)   . Septic thrombophlebitis   . 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 Normocytic anemia 07/21/2014  . Obesity 07/21/2014  . Elevated liver enzymes 07/21/2014  . Rash 07/21/2014  . Sepsis (HCC) 07/20/2014  .  Leukocytosis 07/20/2014  . Nausea and vomiting 07/20/2014  . Blood poisoning (HCC)   . Abdominal pain 07/19/2014  . UTI (urinary tract infection) 07/19/2014  . Portal vein thrombosis 07/19/2014    Past Surgical History:  Procedure Laterality Date  . BACK SURGERY    . SPLENECTOMY, TOTAL      OB History    No data available       Home Medications    Prior to Admission medications   Medication Sig Start Date End Date Taking? Authorizing Provider  amphetamine-dextroamphetamine (ADDERALL) 20 MG tablet Take 20 mg by mouth 2 (two) times daily.    Historical Provider, MD  diclofenac (VOLTAREN) 75 MG EC tablet Take 1 tablet (75 mg total) by mouth 2 (two) times daily. 11/09/15   Lenda Kelp, MD  HYDROcodone-acetaminophen (NORCO) 5-325 MG tablet Take 1 tablet by mouth every 6 (six) hours as needed for moderate pain. 11/09/15   Lenda Kelp, MD  naproxen (NAPROSYN) 500 MG tablet Take 1 tablet (500 mg total) by mouth 2 (two) times daily. 11/13/15   Cheri Fowler, PA-C  ondansetron (ZOFRAN) 4 MG tablet Take 1 tablet (4 mg total) by mouth every 6 (six) hours. 11/13/15   Cheri Fowler, PA-C    Family History History reviewed. No pertinent family history.  Social History Social History  Substance Use Topics  . Smoking status: Never Smoker  . Smokeless tobacco: Never Used  . Alcohol use No     Allergies   Other and Tape  Review of Systems Review of Systems  Respiratory: Positive for shortness of breath.   Cardiovascular: Positive for chest pain.  Gastrointestinal: Positive for nausea. Negative for constipation, diarrhea and vomiting.  Genitourinary: Positive for flank pain (right sided). Negative for dysuria and hematuria.  Neurological: Positive for dizziness (room spinning) and weakness.  All other systems reviewed and are negative.    Physical Exam Updated Vital Signs BP 99/62 (BP Location: Left Arm)   Pulse 79   Temp 98.2 F (36.8 C) (Oral)   Resp 18   Ht 4\' 11"  (1.499 m)    Wt 79.4 kg   LMP 10/28/2015   SpO2 97%   BMI 35.35 kg/m   Physical Exam  Constitutional: She is oriented to person, place, and time. She appears well-developed and well-nourished.  Non-toxic appearance. She does not have a sickly appearance. She does not appear ill.  Patient laughing and talking on the phone.   HENT:  Head: Normocephalic and atraumatic.  Mouth/Throat: Oropharynx is clear and moist.  Eyes: Conjunctivae are normal. Pupils are equal, round, and reactive to light.  Neck: Normal range of motion. Neck supple.  Cardiovascular: Normal rate and regular rhythm.   No lower extremity edema.   Pulmonary/Chest: Effort normal and breath sounds normal. No accessory muscle usage or stridor. No respiratory distress. She has no wheezes. She has no rhonchi. She has no rales.  Abdominal: Soft. Bowel sounds are normal. She exhibits no distension. There is no tenderness.  Musculoskeletal: Normal range of motion.       Back:  Lymphadenopathy:    She has no cervical adenopathy.  Neurological: She is alert and oriented to person, place, and time.  Mental Status:   AOx3.  Speech clear without dysarthria. Cranial Nerves:  I-not tested  II-PERRLA  III, IV, VI-EOMs intact  V-temporal and masseter strength intact  VII-symmetrical facial movements intact, no facial droop  VIII-hearing grossly intact bilaterally  IX, X-gag intact  XI-strength of sternomastoid and trapezius muscles 5/5  XII-tongue midline Motor:   Good muscle bulk and tone  Strength 5/5 bilaterally in upper and lower extremities   Cerebellar--intact RAMs, finger to nose intact bilaterally.  Gait normal without ataxia.   No pronator drift Sensory:  Intact in upper and lower extremities   Skin: Skin is warm and dry.  Psychiatric: She has a normal mood and affect. Her behavior is normal.     ED Treatments / Results  .DIAGNOSTIC STUDIES: Oxygen Saturation is 100% on RA, normal by my interpretation.  COORDINATION OF  CARE: 5:59 PM-Will order blood work, UA. Discussed treatment plan with pt at bedside and pt agreed to plan.   Labs (all labs ordered are listed, but only abnormal results are displayed) Labs Reviewed  URINALYSIS, ROUTINE W REFLEX MICROSCOPIC (NOT AT [facility]) - Abnormal; Notable for the following:       Result Value   APPearance CLOUDY (*)    All other components within normal limits  CBC WITH DIFFERENTIAL/PLATELET - Abnormal; Notable for the following:    WBC 15.7 (*)    RBC 3.77 (*)    Hemoglobin 11.9 (*)    Lymphs Abs 8.1 (*)    Monocytes Absolute 1.6 (*)    All other components within normal limits  COMPREHENSIVE METABOLIC PANEL - Abnormal; Notable for the following:    BUN 23 (*)    All other components within normal limits  PREGNANCY, URINE  D-DIMER, QUANTITATIVE (NOT AT [facility])  TROPONIN I  PATHOLOGIST SMEAR REVIEW  POCT CBG (FASTING - GLUCOSE)-MANUAL ENTRY  CBG MONITORING, ED    EKG  EKG Interpretation None      ED ECG REPORT   Date: 11/13/2015  Rate: 69  Rhythm: normal sinus rhythm  QRS Axis: normal  Intervals: normal  ST/T Wave abnormalities: nonspecific T wave changes  Conduction Disutrbances:none  Narrative Interpretation:   Old EKG Reviewed: unchanged  I have personally reviewed the EKG tracing and agree with the computerized printout as noted.   Radiology Dg Chest 2 View  Result Date: 11/12/2015 CLINICAL DATA:  Shortness of breath.  Chest pain. EXAM: CHEST  2 VIEW COMPARISON:  Chest radiograph 09/12/2015.  CT chest 09/12/2015 FINDINGS: The heart size and mediastinal contours are within normal limits. Both lungs are clear. 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 No acute bony abnormality identified. Upper abdomen is unremarkable. IMPRESSION: No active cardiopulmonary disease. Electronically Signed   By: Britta Mccreedy M.D.   On: 11/12/2015 19:05   Ct Angio Chest Pe W/cm &/or Wo Cm  Result Date: 11/12/2015 CLINICAL DATA:  RIGHT flank pain  beginning yesterday evening, syncopal episode at 0200 hours. Dizziness, chest pain and shortness of breath. Leukocytosis. History of portal vein thrombosis, Budd-Chiari syndrome. EXAM: CT ANGIOGRAPHY CHEST WITH CONTRAST TECHNIQUE: Multidetector CT imaging of the chest was performed using the standard protocol during bolus administration of intravenous contrast. Multiplanar CT image reconstructions and MIPs were obtained to evaluate the vascular anatomy. CONTRAST:  100 cc Isovue 370 COMPARISON:  Chest radiograph November 12, 2015 and CT chest September 12, 2015 FINDINGS: PULMONARY ARTERY: Adequate contrast opacification of the pulmonary artery's. Main pulmonary artery is not enlarged. No pulmonary arterial filling defects to the level of the subsegmental branches. MEDIASTINUM: Heart and pericardium are unremarkable, no right heart strain. Thoracic aorta is normal course and caliber, unremarkable. No lymphadenopathy by CT size criteria. Small amount of residual thymic tissue. LUNGS: Tracheobronchial tree is patent, no pneumothorax. No pleural effusions, focal consolidations, pulmonary nodules or masses. SOFT TISSUES AND OSSEOUS STRUCTURES: Included view of the abdomen is nonacute; hypodense liver suggests steatosis. Examination is not tailored for assessment of have hepatic vein or portal vein patency. Status post splenectomy. Soft tissues non osseous structure nonsuspicious; streak artifact from Harrington rods. Review of the MIP images confirms the above findings. IMPRESSION: Negative CTA chest. Electronically Signed   By: Awilda Metro M.D.   On: 11/12/2015 22:31    Procedures Procedures (including critical care time)  Medications Ordered in ED Medications  sodium chloride 0.9 % bolus 1,000 mL (0 mLs Intravenous Stopped 11/12/15 1934)    And  0.9 %  sodium chloride infusion ( Intravenous Stopped 11/12/15 2259)  ondansetron (ZOFRAN) injection 4 mg (4 mg Intravenous Given 11/12/15 1745)  ketorolac (TORADOL) 30 MG/ML  injection 30 mg (30 mg Intravenous Given 11/12/15 2013)  iopamidol (ISOVUE-370) 76 % injection 100 mL (100 mLs Intravenous Contrast Given 11/12/15 2155)  metoCLOPramide (REGLAN) injection 10 mg (10 mg Intravenous Given 11/12/15 2259)     Initial Impression / Assessment and Plan / ED Course  I have reviewed the triage vital signs and the nursing notes.  Pertinent labs & imaging results that were available during my care of the patient were reviewed by me and considered in my medical decision making (see chart for details).  Clinical Course   Patient presents with sudden onset right pain with syncopal episode due to pain with shortness of breath and chest pain. She has a history of portal vein thrombosis taking OCPs.  VSS, NAD. On exam, patient appears well, nontoxic or septic. She does have right flank tenderness as well as left flank tenderness. Heart RRR, lungs clear to auscultation bilaterally, abdomen soft and benign. She has no urinary symptoms or gynecologic complaints. No fever. She does have a white count of 15.7. Otherwise, labs without acute abnormalities. D-dimer negative however, given her history CT a chest was obtained to evaluate for PE, this was negative. UA appears noninfectious. EKG showed no acute changes. Patient showed improvement after Toradol Zofran, and Reglan. She is able to tolerate by mouth without difficulty. Doubt pyelonephritis, nephrolithiasis, cholelithiasis, gynecologic etiology.  Doubt PE, PNA, or ACS. Possible musculoskeletal etiology versus and/or viral illness. Plan to discharge home with Naprosyn and Zofran. Return precautions discussed. Patient agrees and acknowledges the above plan for discharge.  Final Clinical Impressions(s) / ED Diagnoses   Final diagnoses:  Right flank pain  Nausea    New Prescriptions New Prescriptions   NAPROXEN (NAPROSYN) 500 MG TABLET    Take 1 tablet (500 mg total) by mouth 2 (two) times daily.   ONDANSETRON (ZOFRAN) 4 MG TABLET     Take 1 tablet (4 mg total) by mouth every 6 (six) hours.   I personally performed the services described in this documentation, which was scribed in my presence. The recorded information has been reviewed and is accurate.     Cheri Fowler, PA-C 11/13/15 0009    Loren Racer, MD 11/15/15 2240

## 2015-11-12 NOTE — ED Notes (Addendum)
Pt reports R flank pain with nausea since yesterday. Denies vomiting. Pt family reports she had a syncopal episode last night but refused to go to the hospital. Denies urinary symptoms

## 2015-11-12 NOTE — ED Notes (Addendum)
Bed locked in lowest position, denies any other needs at this time.

## 2015-11-12 NOTE — ED Triage Notes (Signed)
Patient presents to ED after having dizziness, nausea and pain in abdomen. Patient reports bilateral flank pain and lower back pain.

## 2015-11-12 NOTE — ED Notes (Signed)
Tolerated PO fluids, denies nausea.

## 2015-11-12 NOTE — ED Notes (Signed)
Up to b/r, steady gait, s.o. at side, NAD, calm, reglan infusing.

## 2015-11-13 MED ORDER — NAPROXEN 500 MG PO TABS: 500.0000 mg | ORAL_TABLET | Freq: Two times a day (BID) | ORAL | 0 refills | Status: DC

## 2015-11-13 MED ORDER — ONDANSETRON HCL 4 MG PO TABS: 4.0000 mg | ORAL_TABLET | Freq: Four times a day (QID) | ORAL | 0 refills | Status: AC

## 2015-11-13 NOTE — ED Notes (Addendum)
Dressing self, steady on feet, "ready to go to [employer] to eat".

## 2015-11-14 LAB — PATHOLOGIST SMEAR REVIEW

## 2015-11-15 DIAGNOSIS — S99911A Unspecified injury of right ankle, initial encounter: Secondary | ICD-10-CM | POA: Insufficient documentation

## 2015-11-15 NOTE — Assessment & Plan Note (Signed)
consistent with peroneal tendon strain, lateral sprain.  Icing, diclofenac with norco as needed.  Cam walker with scaphoid pads vs ASO (felt uncomfortable with ankle brace previously).  Shown home ROM exercises to do daily.  F/u in 2 weeks.

## 2015-11-15 NOTE — Progress Notes (Signed)
PCP: Karie ChimeraEESE,BETTI D, MD  Subjective:   HPI: Patient is a 20 y.o. female here for right ankle injury.  Patient reports 2 [place] Then last week stepped off a curb and again inverted her right ankle. + swelling. Remote prior sprain. Pain level is 8/10, sharp and lateral. Taking ibuprofen. Worse with ambulation. Using an OTC brace. No skin changes, numbness.  Past Medical History:  Diagnosis Date  . Budd-Chiari syndrome (HCC)   . Other family problem    Sibling has Antiphospholipid Syndrome  . Portal vein thrombosis 4/16   R portal vein blood clot    Current Outpatient Prescriptions on File Prior to Visit  Medication Sig Dispense Refill  . amphetamine-dextroamphetamine (ADDERALL) 20 MG tablet Take 20 mg by mouth 2 (two) times daily.    . [DISCONTINUED] rivaroxaban (XARELTO) 20 MG TABS tablet Take 1 tablet (20 mg total) by mouth daily with supper. 30 tablet 3   No current facility-administered medications on file prior to visit.     Past Surgical History:  Procedure Laterality Date  . BACK SURGERY    . SPLENECTOMY, TOTAL      Allergies  Allergen Reactions  . Other     Any form of birth control   . Tape Hives    Can use paper tape    Social History   Social History  . Marital status: Single    Spouse name: N/A  . Number of children: N/A  . Years of education: N/A   Occupational History  . Not on file.   Social History Main Topics  . Smoking status: Never Smoker  . Smokeless tobacco: Never Used  . Alcohol use No  . Drug use: No  . Sexual activity: Yes    Birth control/ protection: None   Other Topics Concern  . Not on file   Social History Narrative  . No narrative on file    No family history on file.  BP 115/77   Pulse 73   Ht 4\' 11"  (1.499 m)   Wt 175 lb (79.4 kg)   LMP 10/22/2015   BMI 35.35 kg/m   Review of Systems: See HPI above.    Objective:  Physical Exam:  Gen: NAD, comfortable in exam  room  Right ankle: Mild lateral swelling.  No bruising, other deformity. Mod limitation motion all directions with pain on IR and ER TTP  Over peroneal tendons, ATFL.  No other tenderness. Trace ant drawer, negative talar tilt. Negative syndesmotic compression. Thompsons test negative. NV intact distally.  Left ankle: FROM without pain.  MSK u/s right ankle: peroneal tendons thickened, mild neovascularity and target signs.  No evidence tearing.  No other abnormalities on u/s of ankle.    Assessment & Plan:  1. Right ankle injury - consistent with peroneal tendon strain, lateral sprain.  Icing, diclofenac with norco as needed.  Cam walker with scaphoid pads vs ASO (felt uncomfortable with ankle brace previously).  Shown home ROM exercises to do daily.  F/u in 2 weeks.

## 2015-11-30 ENCOUNTER — Emergency Department (HOSPITAL_BASED_OUTPATIENT_CLINIC_OR_DEPARTMENT_OTHER): Payer: Medicaid Other

## 2015-11-30 ENCOUNTER — Encounter (HOSPITAL_BASED_OUTPATIENT_CLINIC_OR_DEPARTMENT_OTHER): Payer: Self-pay | Admitting: *Deleted

## 2015-11-30 ENCOUNTER — Emergency Department (HOSPITAL_BASED_OUTPATIENT_CLINIC_OR_DEPARTMENT_OTHER)
Admission: EM | Admit: 2015-11-30 | Discharge: 2015-11-30 | Disposition: A | Discharge status: Home / Self Care | Payer: Medicaid Other | Attending: Emergency Medicine | Admitting: Emergency Medicine

## 2015-11-30 DIAGNOSIS — Z79899 Other long term (current) drug therapy: Secondary | ICD-10-CM | POA: Diagnosis not present

## 2015-11-30 DIAGNOSIS — J020 Streptococcal pharyngitis: Secondary | ICD-10-CM | POA: Diagnosis not present

## 2015-11-30 DIAGNOSIS — M549 Dorsalgia, unspecified: Secondary | ICD-10-CM | POA: Diagnosis not present

## 2015-11-30 DIAGNOSIS — J029 Acute pharyngitis, unspecified: Secondary | ICD-10-CM | POA: Diagnosis present

## 2015-11-30 LAB — CBC WITH DIFFERENTIAL/PLATELET
BASOS ABS: 0 10*3/uL (ref 0.0–0.1)
BASOS PCT: 0 %
Band Neutrophils: 21 %
Eosinophils Absolute: 0 10*3/uL (ref 0.0–0.7)
Eosinophils Relative: 0 %
HCT: 35.4 % — ABNORMAL LOW (ref 36.0–46.0)
Hemoglobin: 11.8 g/dL — ABNORMAL LOW (ref 12.0–15.0)
LYMPHS ABS: 3.2 10*3/uL (ref 0.7–4.0)
Lymphocytes Relative: 13 %
MCH: 31.3 pg (ref 26.0–34.0)
MCHC: 33.3 g/dL (ref 30.0–36.0)
MCV: 93.9 fL (ref 78.0–100.0)
MONO ABS: 2.5 10*3/uL — AB (ref 0.1–1.0)
Monocytes Relative: 10 %
NEUTROS ABS: 18.8 10*3/uL — AB (ref 1.7–7.7)
NEUTROS PCT: 56 %
PLATELETS: 341 10*3/uL (ref 150–400)
RBC: 3.77 MIL/uL — AB (ref 3.87–5.11)
RDW: 15.5 % (ref 11.5–15.5)
WBC: 24.5 10*3/uL — AB (ref 4.0–10.5)

## 2015-11-30 LAB — URINALYSIS, ROUTINE W REFLEX MICROSCOPIC
Bilirubin Urine: NEGATIVE
Glucose, UA: NEGATIVE mg/dL
Hgb urine dipstick: NEGATIVE
KETONES UR: NEGATIVE mg/dL
LEUKOCYTES UA: NEGATIVE
Nitrite: NEGATIVE
PROTEIN: NEGATIVE mg/dL
Specific Gravity, Urine: 1.025 (ref 1.005–1.030)
pH: 6 (ref 5.0–8.0)

## 2015-11-30 LAB — PREGNANCY, URINE: PREG TEST UR: NEGATIVE

## 2015-11-30 LAB — BASIC METABOLIC PANEL
ANION GAP: 9 (ref 5–15)
BUN: 13 mg/dL (ref 6–20)
CALCIUM: 9 mg/dL (ref 8.9–10.3)
CO2: 22 mmol/L (ref 22–32)
CREATININE: 0.65 mg/dL (ref 0.44–1.00)
Chloride: 105 mmol/L (ref 101–111)
GFR calc Af Amer: >60 mL/min (ref >60)
GLUCOSE: 98 mg/dL (ref 65–99)
Potassium: 3.3 mmol/L — ABNORMAL LOW (ref 3.5–5.1)
Sodium: 136 mmol/L (ref 135–145)

## 2015-11-30 LAB — RAPID STREP SCREEN (MED CTR MEBANE ONLY): Streptococcus, Group A Screen (Direct): POSITIVE — AB

## 2015-11-30 MED ORDER — NAPROXEN 250 MG PO TABS: 250.0000 mg | ORAL_TABLET | Freq: Two times a day (BID) | ORAL | 0 refills | Status: DC

## 2015-11-30 MED ORDER — IBUPROFEN 800 MG PO TABS
800.0000 mg | ORAL_TABLET | Freq: Once | ORAL | Status: AC
  Administered 2015-11-30: 800 mg via ORAL
  Filled 2015-11-30: qty 1

## 2015-11-30 MED ORDER — PENICILLIN G BENZATHINE 1200000 UNIT/2ML IM SUSP
1.2000 10*6.[IU] | Freq: Once | INTRAMUSCULAR | Status: AC
  Administered 2015-11-30: 1.2 10*6.[IU] via INTRAMUSCULAR
  Filled 2015-11-30: qty 2

## 2015-11-30 MED ORDER — SODIUM CHLORIDE 0.9 % IV BOLUS (SEPSIS)
1000.0000 mL | Freq: Once | INTRAVENOUS | Status: AC
  Administered 2015-11-30: 1000 mL via INTRAVENOUS

## 2015-11-30 NOTE — ED Triage Notes (Signed)
Pt c/o  Fever and body aches x 1 day.

## 2015-11-30 NOTE — ED Provider Notes (Signed)
[facility] Provider Note   CSN: [id number] Arrival date & time: 11/30/15  1729  By signing my name below, I, Christy Sartorius, attest that this documentation has been prepared under the direction and in the presence of  Everlene Farrier, PA-C. Electronically Signed: Christy Sartorius, ED Scribe. 11/30/15. 6:35 PM.  History   Chief Complaint Chief Complaint  Patient presents with  . Fever   The history is provided by the patient. No language interpreter was used.     HPI Comments:  Carol [name] is a 20 y.o. female who presents to the Emergency Department complaining of body aches and malaise beginning today and sore throat beginning a few days ago.  She also notes associated nausea, nasal congestion, abdominal pain, back pain and SOB.  Today she had a fever of 101.  She took nyquil at 4 PM with minimal relief.  She denies cough, Chest pain, neck pain, neck stiffness, headache, vomiting, diarrhea, dysuria and frequency of urination.  She is asplenic.   Past Medical History:  Diagnosis Date  . Budd-Chiari syndrome (HCC)   . Other family problem    Sibling has Antiphospholipid Syndrome  . Portal vein thrombosis 4/16   R portal vein blood clot    Patient Active Problem List   Diagnosis Date Noted  . Right ankle injury 11/15/2015  . CMV mononucleosis 11/27/2014  . Cytomegalovirus (HCC)   . Septic thrombophlebitis   . 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 Normocytic anemia 07/21/2014  . Obesity 07/21/2014  . Elevated liver enzymes 07/21/2014  . Rash 07/21/2014  . Sepsis (HCC) 07/20/2014  . Leukocytosis 07/20/2014  . Nausea and vomiting 07/20/2014  . Blood poisoning (HCC)   . Abdominal pain 07/19/2014  . UTI (urinary tract infection) 07/19/2014  . Portal vein thrombosis 07/19/2014    Past Surgical History:  Procedure Laterality Date  . BACK SURGERY    . SPLENECTOMY, TOTAL      OB History    No data available         Home Medications    Prior to Admission medications   Medication Sig Start Date End Date Taking? Authorizing Provider  Pseudoeph-Doxylamine-DM-APAP (NYQUIL PO) Take by mouth.   Yes Historical Provider, MD  amphetamine-dextroamphetamine (ADDERALL) 20 MG tablet Take 20 mg by mouth 2 (two) times daily.    Historical Provider, MD  diclofenac (VOLTAREN) 75 MG EC tablet Take 1 tablet (75 mg total) by mouth 2 (two) times daily. 11/09/15   Lenda Kelp, MD  HYDROcodone-acetaminophen (NORCO) 5-325 MG tablet Take 1 tablet by mouth every 6 (six) hours as needed for moderate pain. 11/09/15   Lenda Kelp, MD  naproxen (NAPROSYN) 500 MG tablet Take 1 tablet (500 mg total) by mouth 2 (two) times daily. 11/13/15   Cheri Fowler, PA-C  ondansetron (ZOFRAN) 4 MG tablet Take 1 tablet (4 mg total) by mouth every 6 (six) hours. 11/13/15   Cheri Fowler, PA-C    Family History History reviewed. No pertinent family history.  Social History Social History  Substance Use Topics  . Smoking status: Never Smoker  . Smokeless tobacco: Never Used  . Alcohol use No     Allergies   Other and Tape   Review of Systems Review of Systems  Constitutional: Positive for fever. Negative for chills.  HENT: Positive for congestion and sore throat. Negative for ear discharge, ear pain, trouble swallowing and voice change.   Eyes: Negative for visual disturbance.  Respiratory:  Negative for cough, shortness of breath and wheezing.   Cardiovascular: Negative for chest pain and palpitations.  Gastrointestinal: Positive for abdominal pain and nausea. Negative for diarrhea and vomiting.  Genitourinary: Negative for decreased urine volume, difficulty urinating, dysuria, frequency, hematuria and urgency.  Musculoskeletal: Positive for back pain. Negative for neck pain.  Skin: Negative for rash.  Neurological: Negative for headaches.     Physical Exam Updated Vital Signs BP 134/77   Pulse 113   Temp 98.5 F (36.9 C)    Resp 16   Ht 4\' 11"  (1.499 m)   Wt 175 lb (79.4 kg)   LMP 11/21/2015   SpO2 96%   BMI 35.35 kg/m   Physical Exam  Constitutional: She is oriented to person, place, and time. She appears well-developed and well-nourished. No distress.  Non toxic appearing.  HENT:  Head: Normocephalic and atraumatic.  Right Ear: External ear normal.  Left Ear: External ear normal.  Mouth/Throat: Oropharynx is clear and moist. No oropharyngeal exudate.  No TM erythema or loss of landmarks.  Mild posterior oropharyngeal erythema without edema.  Tonsils are surgically absent.  Uvula midline without edema.  No trismus. no drooling.  Boggy nasal turbinates bilaterally.   Eyes: Conjunctivae are normal. Pupils are equal, round, and reactive to light. Right eye exhibits no discharge. Left eye exhibits no discharge.  Neck: Normal range of motion. Neck supple. No JVD present. No tracheal deviation present.  No meningeal signs.  Cardiovascular: Normal rate, regular rhythm, normal heart sounds and intact distal pulses.  Exam reveals no gallop and no friction rub.   No murmur heard. HR 96.    Pulmonary/Chest: Effort normal and breath sounds normal. No stridor. No respiratory distress. She has no wheezes. She has no rales.  Lungs clear to auscultation bilaterally.  Abdominal: Soft. Bowel sounds are normal. She exhibits no distension and no mass. There is tenderness. There is no rebound and no guarding.  Mild generalized abdominal tenderness without focal tenderness.  No peritoneal signs.  No CVA or flank tenderness.  Musculoskeletal: She exhibits no edema or tenderness.  No lower extremity edema or tenderness.  Lymphadenopathy:    She has no cervical adenopathy.  Neurological: She is alert and oriented to person, place, and time. Coordination normal.  Skin: Skin is warm and dry. Capillary refill takes less than 2 seconds. No rash noted. She is not diaphoretic. No erythema. No pallor.  Psychiatric: She has a  normal mood and affect. Her behavior is normal.  Nursing note and vitals reviewed.    ED Treatments / Results   DIAGNOSTIC STUDIES:  Oxygen Saturation is 96% on RA, nml by my interpretation.    COORDINATION OF CARE:  6:31 PM Discussed treatment plan with pt at bedside and pt agreed to plan.  Labs (all labs ordered are listed, but only abnormal results are displayed) Labs Reviewed  RAPID STREP SCREEN (NOT AT [facility]) - Abnormal; Notable for the following:       Result Value   Streptococcus, Group A Screen (Direct) POSITIVE (*)    All other components within normal limits  URINALYSIS, ROUTINE W REFLEX MICROSCOPIC (NOT AT [facility]) - Abnormal; Notable for the following:    Color, Urine AMBER (*)    APPearance CLOUDY (*)    All other components within normal limits  CBC WITH DIFFERENTIAL/PLATELET - Abnormal; Notable for the following:    WBC 24.5 (*)    RBC 3.77 (*)    Hemoglobin 11.8 (*)    HCT  35.4 (*)    Neutro Abs 18.8 (*)    Monocytes Absolute 2.5 (*)    All other components within normal limits  BASIC METABOLIC PANEL - Abnormal; Notable for the following:    Potassium 3.3 (*)    All other components within normal limits  PREGNANCY, URINE    EKG  EKG Interpretation None       Radiology No results found.  Procedures Procedures (including critical care time)  Medications Ordered in ED Medications  sodium chloride 0.9 % bolus 1,000 mL (0 mLs Intravenous Stopped 11/30/15 1949)  ibuprofen (ADVIL,MOTRIN) tablet 800 mg (800 mg Oral Given 11/30/15 1832)  penicillin g benzathine (BICILLIN LA) 1200000 UNIT/2ML injection 1.2 Million Units (1.2 Million Units Intramuscular Given 11/30/15 1914)     Initial Impression / Assessment and Plan / ED Course  I have reviewed the triage vital signs and the nursing notes.  Pertinent labs & imaging results that were available during my care of the patient were reviewed by me and considered in my medical decision making (see chart for  details).  Clinical Course   Patient presented to the emergency department complaining of fever, body aches, sore throat and generalized abdominal pain. On exam the patient is afebrile nontoxic appearing. She does have mild oropharyngeal erythema without edema. Tonsils are surgically absent. Uvula is midline without edema. No trismus. No drooling. She has mild generalized abdominal tenderness to palpation without focal tenderness. No back tenderness to palpation. Lungs clear to auscultation bilaterally. Strep test returned positive. Patient elected for penicillin injection in the emergency department. Blood work revealed a white count of 24,000. BMP is unremarkable. Urine showed no sign of infection. Chest x-ray showed no pneumonia. Patient treated with penicillin for strep throat. I discussed excited course and treatment of strep throat with the patient and mother. I discussed return precautions. Naproxen for pain and fever at discharge. I encouraged her to follow-up closely with primary care. I advised the patient to follow-up with their primary care provider this week. I advised the patient to return to the emergency department with new or worsening symptoms or new concerns. The patient verbalized understanding and agreement with plan.    Final Clinical Impressions(s) / ED Diagnoses   Final diagnoses:  Strep throat    New Prescriptions Discharge Medication List as of 11/30/2015  7:44 PM     I personally performed the services described in this documentation, which was scribed in my presence. The recorded information has been reviewed and is accurate.       Everlene FarrierWilliam Chevon Laufer, PA-C 12/01/15 0111    Jacalyn LefevreJulie Haviland, MD 12/01/15 [phone]

## 2015-12-27 ENCOUNTER — Other Ambulatory Visit: Payer: Self-pay | Admitting: Family Medicine

## 2016-02-12 ENCOUNTER — Encounter (HOSPITAL_BASED_OUTPATIENT_CLINIC_OR_DEPARTMENT_OTHER): Payer: Self-pay | Admitting: Emergency Medicine

## 2016-02-12 ENCOUNTER — Emergency Department (HOSPITAL_BASED_OUTPATIENT_CLINIC_OR_DEPARTMENT_OTHER)
Admission: EM | Admit: 2016-02-12 | Discharge: 2016-02-12 | Disposition: A | Discharge status: Home / Self Care | Attending: Emergency Medicine | Admitting: Emergency Medicine

## 2016-02-12 DIAGNOSIS — Y9389 Activity, other specified: Secondary | ICD-10-CM | POA: Diagnosis not present

## 2016-02-12 DIAGNOSIS — Y999 Unspecified external cause status: Secondary | ICD-10-CM | POA: Diagnosis not present

## 2016-02-12 DIAGNOSIS — Y9289 Other specified places as the place of occurrence of the external cause: Secondary | ICD-10-CM | POA: Insufficient documentation

## 2016-02-12 DIAGNOSIS — R0981 Nasal congestion: Secondary | ICD-10-CM | POA: Diagnosis not present

## 2016-02-12 DIAGNOSIS — W182XXA Fall in (into) shower or empty bathtub, initial encounter: Secondary | ICD-10-CM | POA: Insufficient documentation

## 2016-02-12 DIAGNOSIS — R55 Syncope and collapse: Secondary | ICD-10-CM | POA: Diagnosis not present

## 2016-02-12 LAB — COMPREHENSIVE METABOLIC PANEL
ALT: 35 U/L (ref 14–54)
AST: 30 U/L (ref 15–41)
Albumin: 3.9 g/dL (ref 3.5–5.0)
Alkaline Phosphatase: 86 U/L (ref 38–126)
Anion gap: 7 (ref 5–15)
BUN: 13 mg/dL (ref 6–20)
CHLORIDE: 108 mmol/L (ref 101–111)
CO2: 25 mmol/L (ref 22–32)
CREATININE: 0.58 mg/dL (ref 0.44–1.00)
Calcium: 9.3 mg/dL (ref 8.9–10.3)
GFR calc Af Amer: >60 mL/min (ref >60)
GFR calc non Af Amer: >60 mL/min (ref >60)
Glucose, Bld: 102 mg/dL — ABNORMAL HIGH (ref 65–99)
Potassium: 3.4 mmol/L — ABNORMAL LOW (ref 3.5–5.1)
SODIUM: 140 mmol/L (ref 135–145)
Total Bilirubin: 0.3 mg/dL (ref 0.3–1.2)
Total Protein: 7.6 g/dL (ref 6.5–8.1)

## 2016-02-12 LAB — URINE MICROSCOPIC-ADD ON

## 2016-02-12 LAB — CBC WITH DIFFERENTIAL/PLATELET
BASOS ABS: 0 10*3/uL (ref 0.0–0.1)
BASOS PCT: 0 %
Band Neutrophils: 4 %
EOS ABS: 0.5 10*3/uL (ref 0.0–0.7)
Eosinophils Relative: 4 %
HCT: 40.9 % (ref 36.0–46.0)
HEMOGLOBIN: 13.4 g/dL (ref 12.0–15.0)
Lymphocytes Relative: 50 %
Lymphs Abs: 5.8 10*3/uL — ABNORMAL HIGH (ref 0.7–4.0)
MCH: 31.1 pg (ref 26.0–34.0)
MCHC: 32.8 g/dL (ref 30.0–36.0)
MCV: 94.9 fL (ref 78.0–100.0)
MONO ABS: 0.9 10*3/uL (ref 0.1–1.0)
Monocytes Relative: 8 %
NEUTROS PCT: 34 %
Neutro Abs: 4.4 10*3/uL (ref 1.7–7.7)
Platelets: 341 10*3/uL (ref 150–400)
RBC: 4.31 MIL/uL (ref 3.87–5.11)
RDW: 14.9 % (ref 11.5–15.5)
WBC: 11.6 10*3/uL — ABNORMAL HIGH (ref 4.0–10.5)

## 2016-02-12 LAB — URINALYSIS, ROUTINE W REFLEX MICROSCOPIC
Bilirubin Urine: NEGATIVE
Glucose, UA: NEGATIVE mg/dL
Ketones, ur: NEGATIVE mg/dL
LEUKOCYTES UA: NEGATIVE
Nitrite: NEGATIVE
PROTEIN: NEGATIVE mg/dL
Specific Gravity, Urine: 1.014 (ref 1.005–1.030)
pH: 6.5 (ref 5.0–8.0)

## 2016-02-12 LAB — LIPASE, BLOOD: Lipase: 28 U/L (ref 11–51)

## 2016-02-12 LAB — PREGNANCY, URINE: PREG TEST UR: NEGATIVE

## 2016-02-12 MED ORDER — ACETAMINOPHEN 325 MG PO TABS
650.0000 mg | ORAL_TABLET | Freq: Once | ORAL | Status: AC
  Administered 2016-02-12: 650 mg via ORAL
  Filled 2016-02-12: qty 2

## 2016-02-12 NOTE — ED Triage Notes (Signed)
Pt in c/o syncope events x 1 week, today was in the shower and hit her head very hard when she fell. States this is not normal, denies medical problems that cause syncope or new meds. Pt currently alert, interactive, ambulatory in NAD.

## 2016-02-12 NOTE — Discharge Instructions (Signed)
Continue drinking fluids at home to remain hydrated. Continue taking Tylenol and ibuprofen as prescribed over-the-counter as needed for pain relief. I recommend refraining from taking hot steam showers over the next few weeks and take showers with more lukewarm water temperature to try to prevent future episodes. Please follow up with a primary care provider from the Resource Guide provided below in one week for follow-up evaluation regarding your  near syncopal episodes.  Please return to the Emergency Department if symptoms worsen or new onset of fever, headache, dizziness, visual changes, neck stiffness, chest pain, difficult to breathing, palpitations, abdominal pain, vomiting, numbness, tingling, weakness, confusion, syncope, seizure.

## 2016-02-12 NOTE — ED Provider Notes (Signed)
[facility] Note   CSN: [id number] Arrival date & time: 02/12/16  1610  By signing my name below, I, Phillis Haggis, attest that this documentation has been prepared under the direction and in the presence of Melburn Hake, [place]. Electronically Signed: Phillis Haggis, ED Scribe. 02/12/16. 5:04 PM.  History   Chief Complaint Chief Complaint  Patient presents with  . Loss of Consciousness   The history is provided by the patient. No language interpreter was used.    HPI Comments: Carol [name] is a 20 y.o. female with a hx Budd-Chiari syndrome who presents to the Emergency Department complaining of pre-syncopal events onset one week ago. Pt says that she experienced pre-syncope today in the hot shower around 10 AM, fell backwards against the shower wall, and hit the back of her head. She said that she felt lightheaded and dizzy and notes that her hearing will get "fuzzy" and her vision will go black during the episode . Denies LOC. Pt says that she also hit her head 4 days ago. She felt dizzy and lightheaded then hit the front of her head, denies LOC. She did not experience syncope at that time. Pt has been experiencing intermittent headaches, mild cough and congestion, tingling in the right arm, subjective weakness in the legs, and nausea. The episodes will last about 5-10 minutes. Pt has not been evaluated for these symptoms as they just began this past week. She denies recent alcohol or drug use, fever, chest pain, palpitations, abdominal pain, constipation, diarrhea, vomiting, seizures, confusion, dysuria, or hematuria. Pt was previously on blood thinners for portal vein thrombosis, but no longer takes it. Reports hx of splenectomy at age 30 due to splenic laceration from injury.  Past Medical History:  Diagnosis Date  . Budd-Chiari syndrome (HCC)   . Other family problem    Sibling has Antiphospholipid Syndrome  . 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 Right ankle injury 11/15/2015  . CMV mononucleosis 11/27/2014  . Cytomegalovirus (HCC)   . Septic thrombophlebitis   . 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 Normocytic anemia 07/21/2014  . Obesity 07/21/2014  . Elevated liver enzymes 07/21/2014  . Rash 07/21/2014  . Sepsis (HCC) 07/20/2014  . Leukocytosis 07/20/2014  . Nausea and vomiting 07/20/2014  . Blood poisoning (HCC)   . Abdominal pain 07/19/2014  . UTI (urinary tract infection) 07/19/2014  . Portal vein thrombosis 07/19/2014    Past Surgical History:  Procedure Laterality Date  . BACK SURGERY    . SPLENECTOMY, TOTAL      OB History    No data available       Home Medications    Prior to Admission medications   Medication Sig Start Date End Date Taking? Authorizing Provider  amphetamine-dextroamphetamine (ADDERALL) 20 MG tablet Take 20 mg by mouth 2 (two) times daily.    Historical Provider, MD  diclofenac (VOLTAREN) 75 MG EC tablet Take 1 tablet (75 mg total) by mouth 2 (two) times daily. 11/09/15   Lenda Kelp, MD  HYDROcodone-acetaminophen (NORCO) 5-325 MG tablet Take 1 tablet by mouth every 6 (six) hours as needed for moderate pain. 11/09/15   Lenda Kelp, MD  naproxen (NAPROSYN) 250 MG tablet Take 1 tablet (250 mg total) by mouth 2 (two) times daily with a meal. 11/30/15   Everlene Farrier, PA-C  ondansetron [facility]) 4  MG tablet Take 1 tablet (4 mg total) by mouth every 6 (six) hours. 11/13/15   Cheri FowlerKayla Rose, PA-C  Pseudoeph-Doxylamine-DM-APAP (NYQUIL PO) Take by mouth.    Historical Provider, MD    Family History History reviewed. No pertinent family history.  Social History Social History  Substance Use Topics  . Smoking status: Never Smoker  . Smokeless tobacco: Never Used  . Alcohol use No     Allergies   Other and Tape   Review of Systems Review of Systems  Constitutional: Negative for  fever.  HENT: Positive for congestion.   Respiratory: Positive for cough.   Cardiovascular: Negative for chest pain and palpitations.  Gastrointestinal: Positive for nausea. Negative for abdominal pain, constipation, diarrhea and vomiting.  Genitourinary: Negative for dysuria and hematuria.  Neurological: Positive for dizziness, syncope, weakness, light-headedness and headaches. Negative for seizures.  Psychiatric/Behavioral: Negative for confusion.  All other systems reviewed and are negative.  Physical Exam Updated Vital Signs BP 114/81 (BP Location: Right Arm)   Pulse 72   Temp 98.1 F (36.7 C) (Oral)   Resp 18   Ht 4\' 11"  (1.499 m)   Wt 79.4 kg   LMP 02/11/2016   SpO2 100%   BMI 35.35 kg/m   Physical Exam  Constitutional: She is oriented to person, place, and time. She appears well-developed and well-nourished. No distress.  HENT:  Head: Normocephalic and atraumatic. Head is without raccoon's eyes, without Battle's sign, without abrasion and without laceration.  Right Ear: Tympanic membrane normal. No hemotympanum.  Left Ear: Tympanic membrane normal. No hemotympanum.  Nose: Nose normal. No sinus tenderness, nasal deformity, septal deviation or nasal septal hematoma. No epistaxis.  Mouth/Throat: Uvula is midline, oropharynx is clear and moist and mucous membranes are normal. No oropharyngeal exudate, posterior oropharyngeal edema, posterior oropharyngeal erythema or tonsillar abscesses.  Eyes: Conjunctivae and EOM are normal. Pupils are equal, round, and reactive to light. Right eye exhibits no discharge. Left eye exhibits no discharge. No scleral icterus.  Neck: Normal range of motion. Neck supple.  Cardiovascular: Normal rate, regular rhythm, normal heart sounds and intact distal pulses.   Pulmonary/Chest: Effort normal and breath sounds normal. No respiratory distress. She has no wheezes. She has no rales. She exhibits no tenderness.  Abdominal: Soft. Bowel sounds are  normal. She exhibits no distension and no mass. There is no tenderness. There is no rebound and no guarding. No hernia.  Musculoskeletal: Normal range of motion. She exhibits no edema, tenderness or deformity.  No cervical, thoracic, or lumbar spine midline TTP.  Full ROM of bilateral upper and lower extremities with 5/5 strength.   2+ radial and PT pulses. Sensation grossly intact.  Patient able to stand and ambulate without ataxia noted.  Neurological: She is alert and oriented to person, place, and time. She has normal strength. No cranial nerve deficit or sensory deficit. Coordination and gait normal.  Skin: Skin is warm and dry. Capillary refill takes less than 2 seconds. She is not diaphoretic.  Nursing note and vitals reviewed.    ED Treatments / Results  DIAGNOSTIC STUDIES: Oxygen Saturation is 100% on RA, normal by my interpretation.    COORDINATION OF CARE: 5:02 PM-Discussed treatment plan which includes labs with pt at bedside and pt agreed to plan.    Labs (all labs ordered are listed, but only abnormal results are displayed) Labs Reviewed  CBC WITH DIFFERENTIAL/PLATELET - Abnormal; Notable for the following:       Result Value   WBC  11.6 (*)    Lymphs Abs 5.8 (*)    All other components within normal limits  URINALYSIS, ROUTINE W REFLEX MICROSCOPIC (NOT AT [facility]) - Abnormal; Notable for the following:    APPearance CLOUDY (*)    Hgb urine dipstick LARGE (*)    All other components within normal limits  COMPREHENSIVE METABOLIC PANEL - Abnormal; Notable for the following:    Potassium 3.4 (*)    Glucose, Bld 102 (*)    All other components within normal limits  URINE MICROSCOPIC-ADD ON - Abnormal; Notable for the following:    Squamous Epithelial / LPF 0-5 (*)    Bacteria, UA RARE (*)    All other components within normal limits  PREGNANCY, URINE  LIPASE, BLOOD    EKG  EKG Interpretation  Date/Time:  Sunday February 12 2016 16:47:45 EST Ventricular Rate:   67 PR Interval:    QRS Duration: 94 QT Interval:  374 QTC Calculation: 395 R Axis:   91 Text Interpretation:  Sinus rhythm Borderline right axis deviation Baseline wander in lead(s) II aVF V1 V6 No significant change since last tracing Confirmed by [facility] MD, PEDRO ([id number]) on 02/12/2016 4:50:37 PM       Radiology No results found.  Procedures Procedures (including critical care time)  Medications Ordered in ED Medications  acetaminophen (TYLENOL) tablet 650 mg (650 mg Oral Given 02/12/16 1723)     Initial Impression / Assessment and Plan / ED Course  I have reviewed the triage vital signs and the nursing notes.  Pertinent labs & imaging results that were available during my care of the patient were reviewed by me and considered in my medical decision making (see chart for details).  Clinical Course     Patient presents with multiple near syncopal episodes that have occurred over the past week while she is taking a hot shower. Reports headache injury denies LOC or seizure like activity. Boyfriend reports witnessing the patient's near syncopal episode today and denies any postictal behavior. VSS. Exam unremarkable, no evidence of head injury. No neuro deficits. Patient able to stand and ambulate, no ataxia noted. GCS 15. No hemotympanum, raccoon eyes, battle sign. Patient denies vomiting. CT head imaging not indicated at this time. Negative orthostatics. Labs and urine with no significant findings. Suspect pt's near syncopal episodes are likely due to vasovagal response related with hot showers. On reevaluation patient is resting comfortably in bed. Discussed results and discharge the patient. Advised patient to follow up with PCP within the next week for reevaluation regarding her near syncopal episodes. Discussed return precautions.  Final Clinical Impressions(s) / ED Diagnoses   Final diagnoses:  Near syncope   I personally performed the services described in this  documentation, which was scribed in my presence. The recorded information has been reviewed and is accurate.   New Prescriptions New Prescriptions   No medications on file     Barrett Henleicole Elizabeth Nadeau, [place]-C 02/12/16 1925    Nira ConnPedro Eduardo Cardama, MD 02/13/16 [phone]

## 2016-02-12 NOTE — ED Notes (Signed)
When doing orthostatics, pt states her head was spinning. Returned to Doctor, general practicestretcher.

## 2016-02-12 NOTE — ED Notes (Signed)
ED Provider at bedside. 

## 2016-02-12 NOTE — ED Notes (Signed)
Pt states this has happened several times since hitting head the day after Halloween. Not sure if she blacked out, fell asleep or what. A&O at present. Follows commands and responds approp to questions. Neuro WNL.

## 2016-11-03 IMAGING — CT CT CHEST W/ CM
2 of 4 series · 17 of 46 positions shown, 19 images · IV contrast (OMNIPAQUE)
Comparison: Abdomen and pelvis CT dated 07/19/2014.

CLINICAL DATA: Portal vein thrombosis and fevers. Clinically, the
patient has septic thrombophlebitis. Previous splenectomy related to
hypercoagulable physiology. Admitted with diffuse abdominal pain and
fever as high as 103. Elevated liver function tests.

EXAM:
CT CHEST, ABDOMEN, AND PELVIS WITH CONTRAST
TECHNIQUE: Multidetector CT imaging of the chest, abdomen and pelvis was
performed following the standard protocol during bolus
administration of intravenous contrast.
CONTRAST:  100mL OMNIPAQUE IOHEXOL 300 MG/ML  SOLN

[Series 2: cap with st · axial · 0.72mm/px · z∈[-568,-48]mm · 14 of 116 slices shown, 16 images]
[im 6/116  soft-tissue]
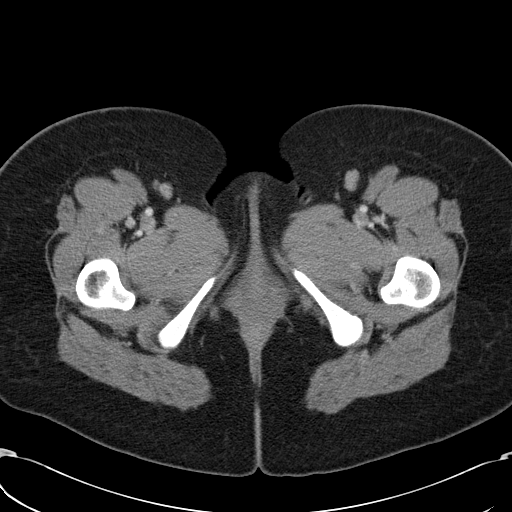
[im 6/116  bone]
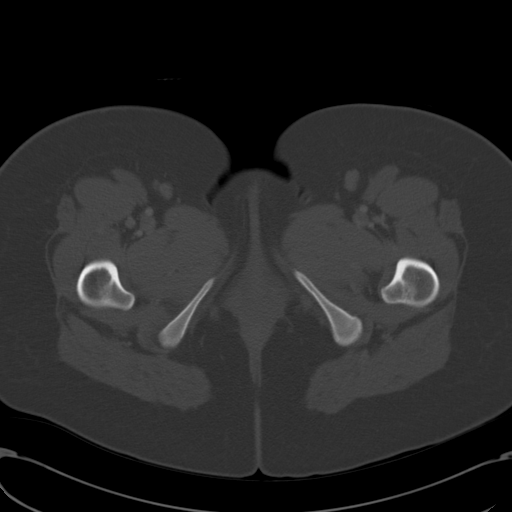
[im 16/116  soft-tissue]
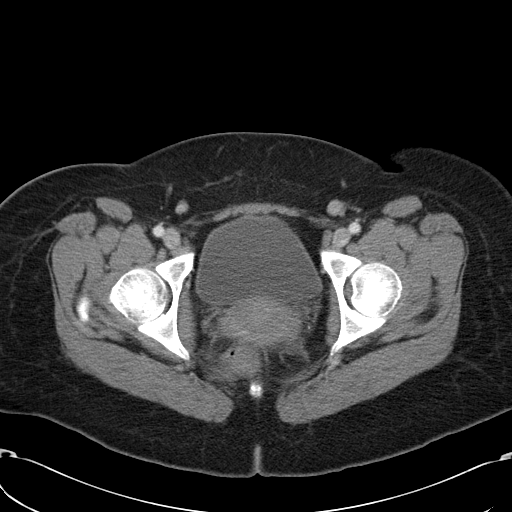
[im 21/116  soft-tissue]
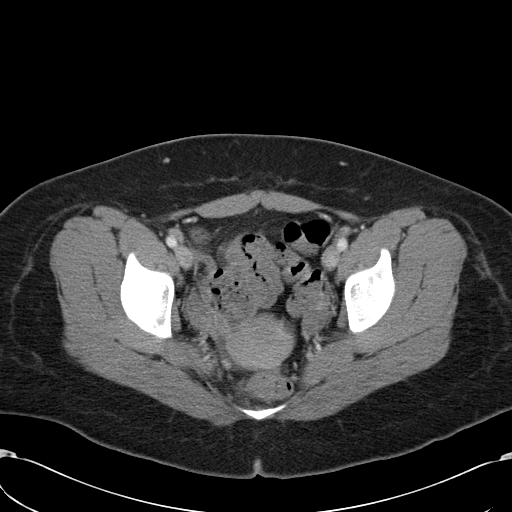
[im 32/116  soft-tissue]
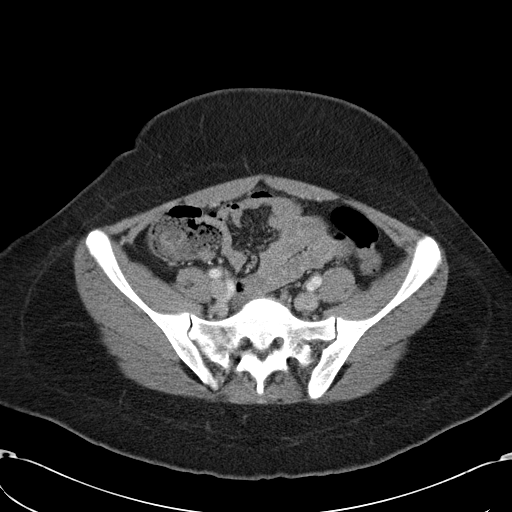
[im 37/116  soft-tissue]
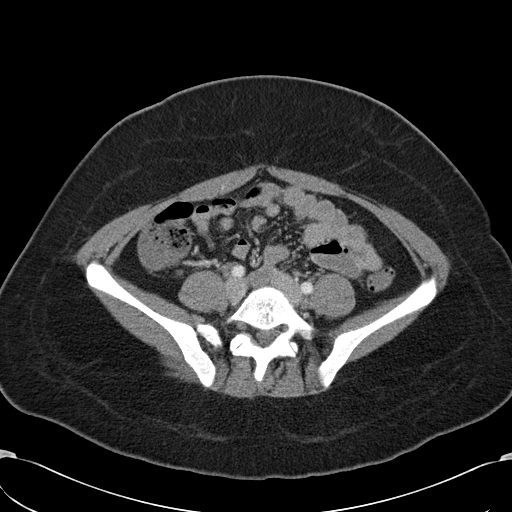
[im 48/116  soft-tissue]
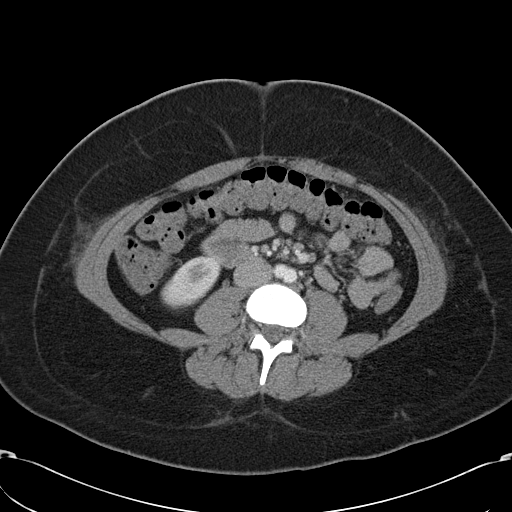
[im 53/116  soft-tissue]
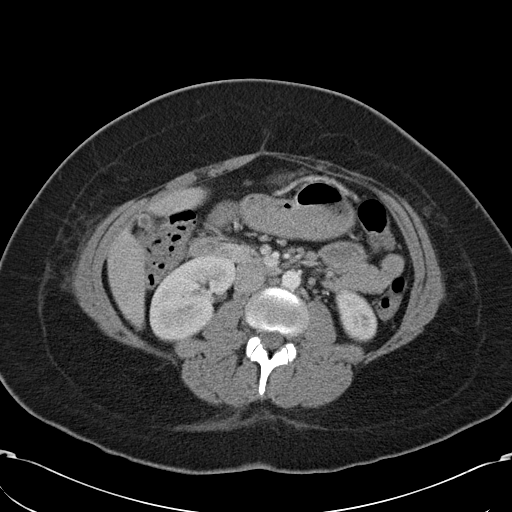
[im 63/116  soft-tissue]
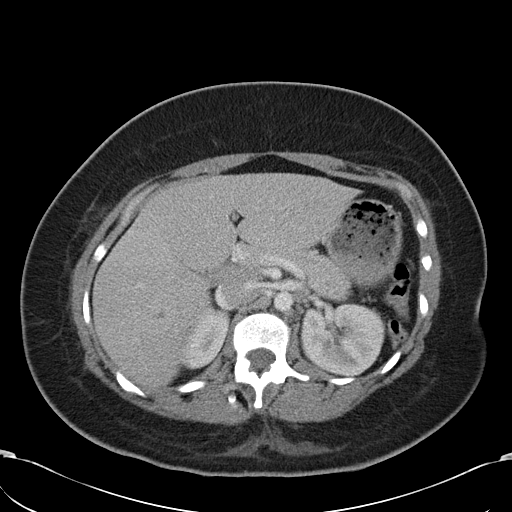
[im 68/116  soft-tissue]
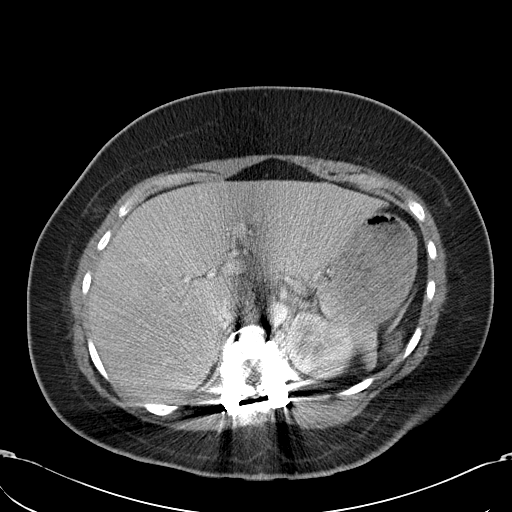
[im 68/116  bone]
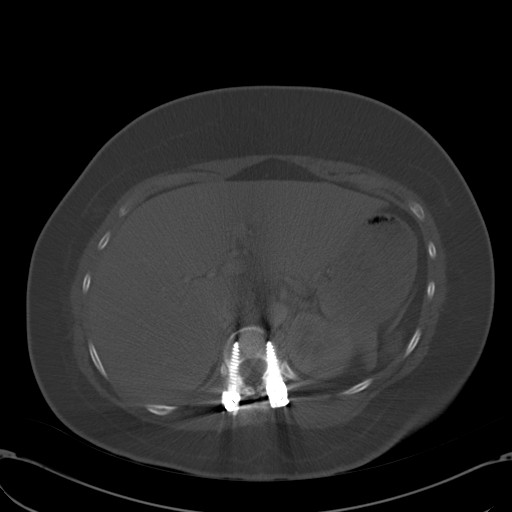
[im 79/116  soft-tissue]
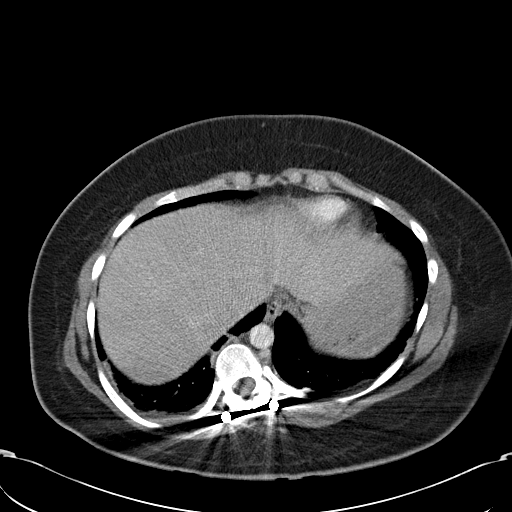
[im 84/116  soft-tissue]
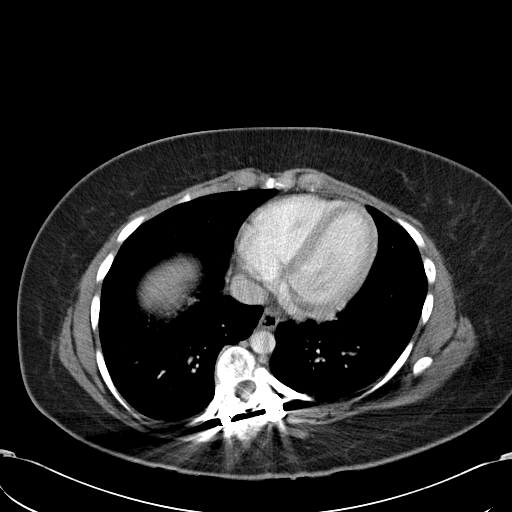
[im 95/116  soft-tissue]
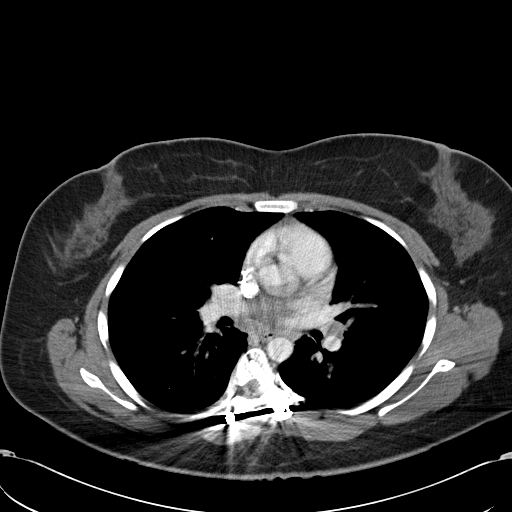
[im 100/116  soft-tissue]
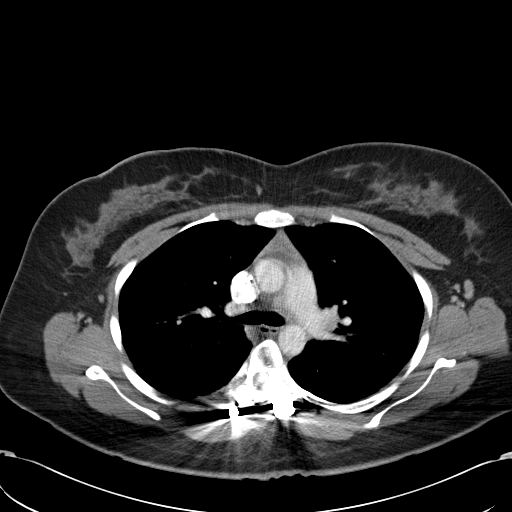
[im 110/116  soft-tissue]
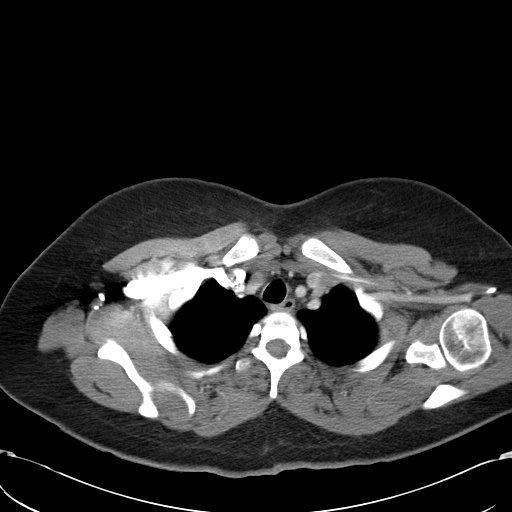

[Series 602: <mpr thick range> · coronal · 1.13mm/px · 3 of 151 slices shown]
[im 51/151  soft-tissue]
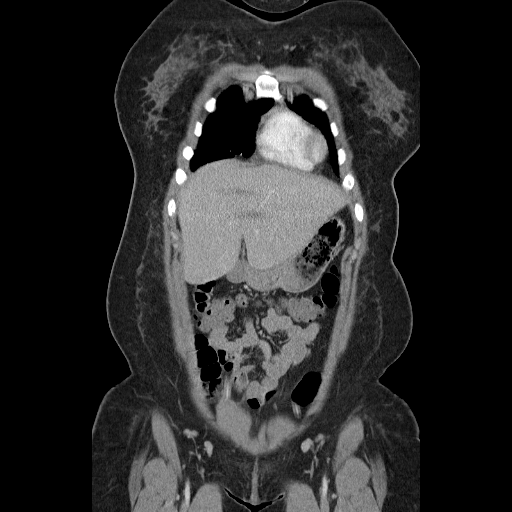
[im 67/151  soft-tissue]
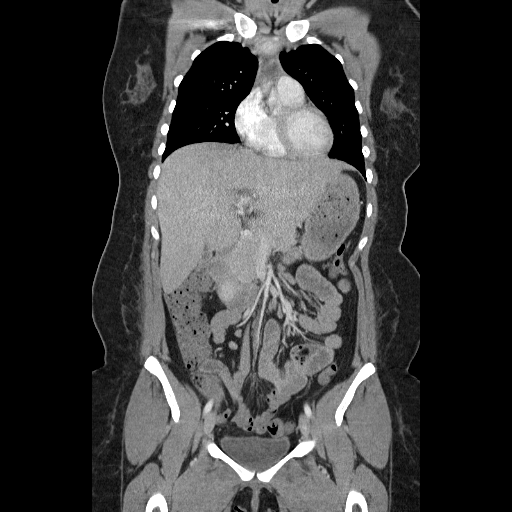
[im 84/151  soft-tissue]
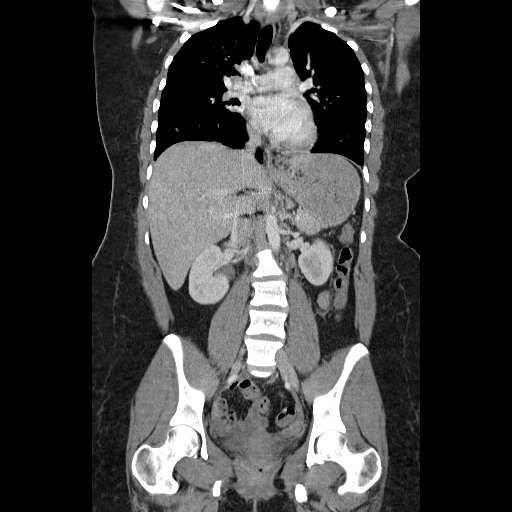

[17 of 46 positions shown; findings below may reference images not displayed]

FINDINGS: CT CHEST FINDINGS

Left subclavian catheter tip at the superior cavoatrial junction.
Thoracic spine fixation hardware and scoliosis. Minimal dependent
atelectasis in both lower lobes. Otherwise, clear lungs. Minimal
right pleural fluid. No lung nodules or enlarged lymph nodes.

CT ABDOMEN AND PELVIS FINDINGS

Surgically absent spleen. Persistent thrombus in the distal right
portal vein and right hepatic lobe portal vein branch. Poorly
distended gallbladder. Normal appearing pancreas, adrenal glands,
kidneys, urinary bladder, uterus and ovaries. No gastrointestinal
abnormalities. Mildly enlarged lymph nodes at the root of the
mesentery without significant change. The largest has a short axis
diameter of 8 mm on image number 58. Mild to moderate levoconvex
lumbar rotary scoliosis.
IMPRESSION: 1. Persistent thrombus in the right portal vein and right hepatic
lobe portal vein branch.
2. Stable mild reactive mesenteric adenopathy.
3. Minimal right pleural effusion.

## 2016-11-06 IMAGING — CR DG CHEST 2V
2 series · 2 of 2 positions shown · non-contrast
Comparison: 07/20/2014 and chest CT dated 07/24/2014.

CLINICAL DATA: Nonproductive cough for the past 2 days. Some
shortness of breath and mid chest pain.

EXAM:
CHEST  2 VIEW

[w chest pa]
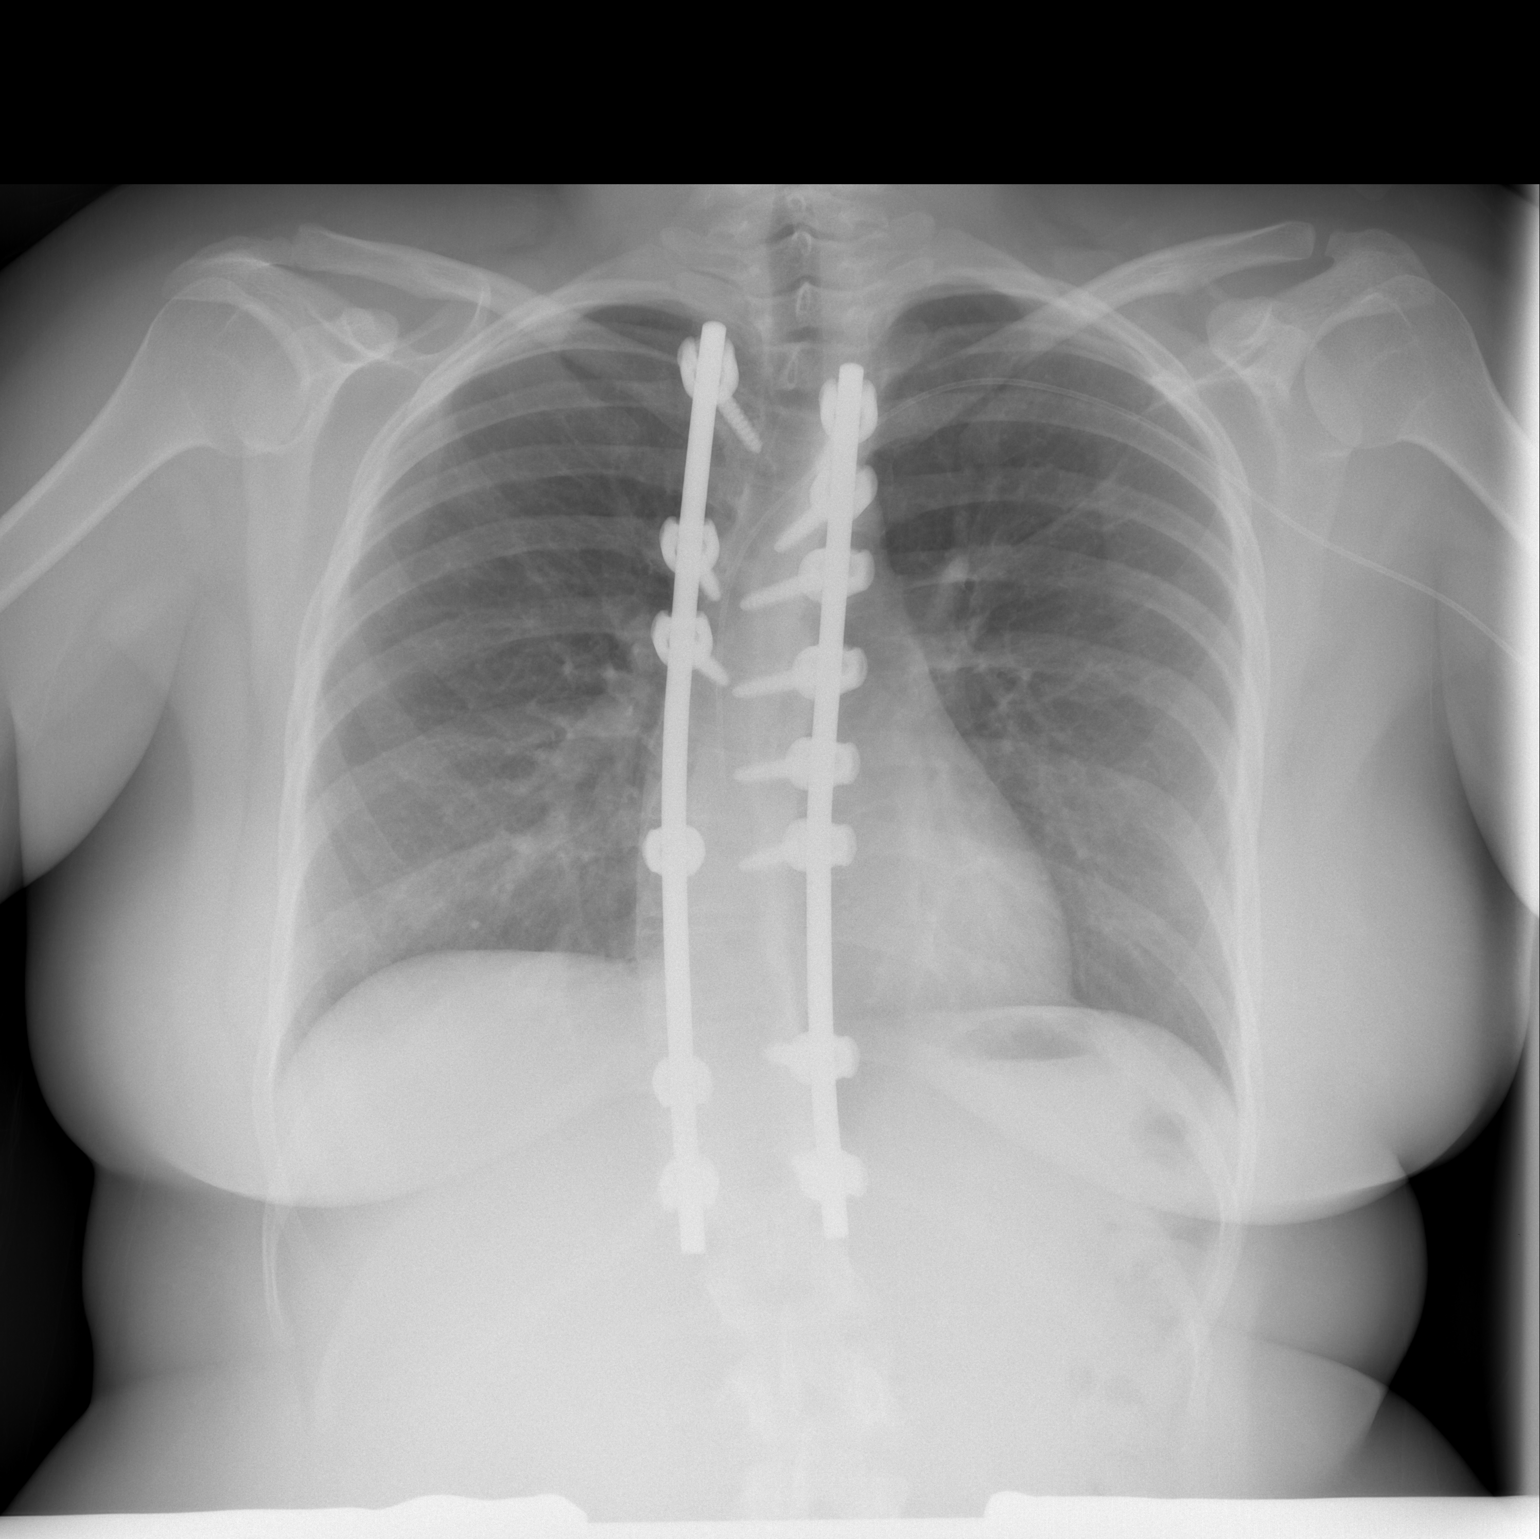

[w chest lat]
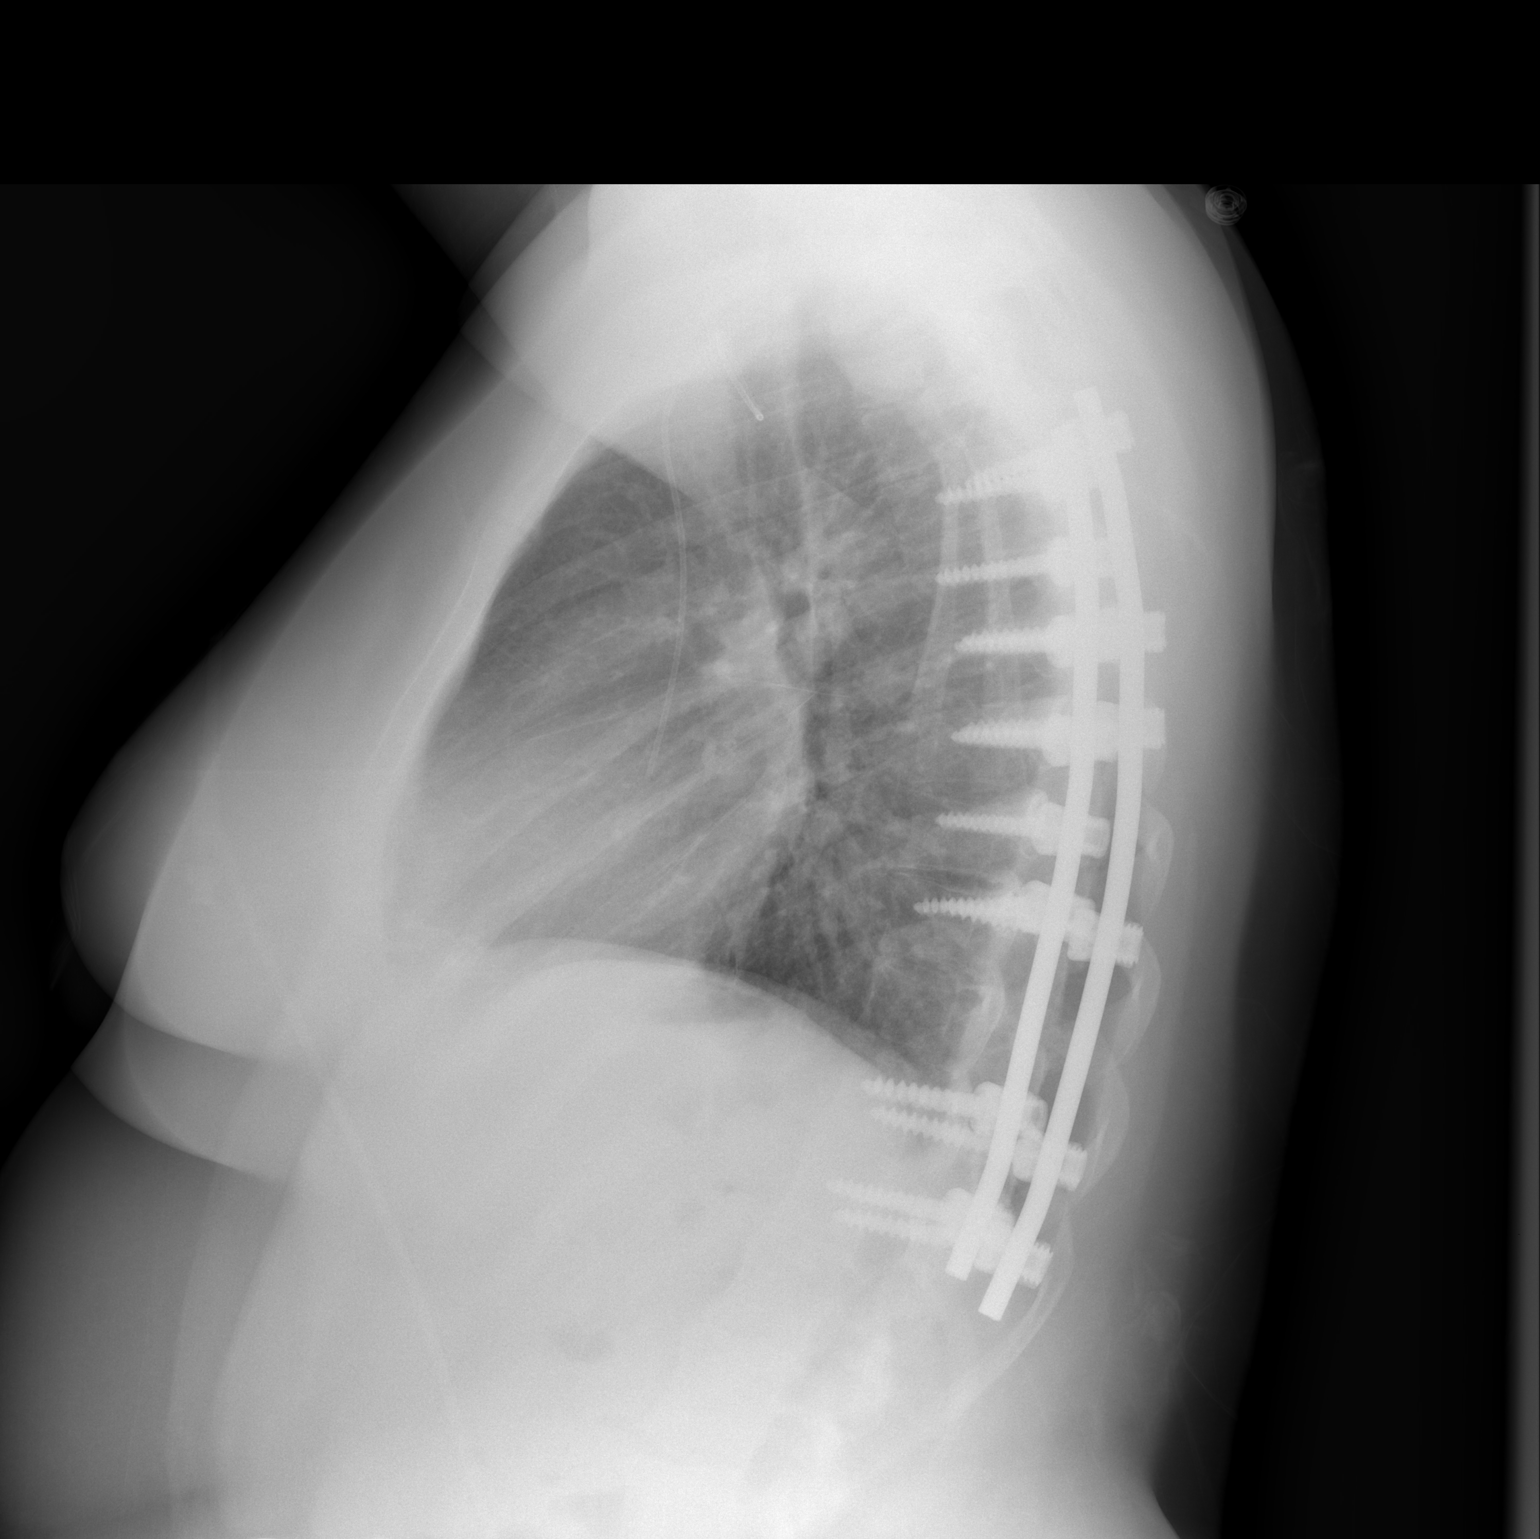

[2 of 2 positions shown; findings below may reference images not displayed]

FINDINGS: Normal sized heart. Clear lungs. Left PICC tip at the superior
cavoatrial junction. Thoracic spine fixation hardware and stable
scoliosis.
IMPRESSION: No acute abnormality.

## 2017-07-02 ENCOUNTER — Other Ambulatory Visit: Payer: Self-pay

## 2017-07-02 ENCOUNTER — Emergency Department (HOSPITAL_BASED_OUTPATIENT_CLINIC_OR_DEPARTMENT_OTHER)

## 2017-07-02 ENCOUNTER — Emergency Department (HOSPITAL_BASED_OUTPATIENT_CLINIC_OR_DEPARTMENT_OTHER)
Admission: EM | Admit: 2017-07-02 | Discharge: 2017-07-03 | Disposition: A | Discharge status: Home / Self Care | Attending: Emergency Medicine | Admitting: Emergency Medicine

## 2017-07-02 ENCOUNTER — Encounter (HOSPITAL_BASED_OUTPATIENT_CLINIC_OR_DEPARTMENT_OTHER): Payer: Self-pay | Admitting: *Deleted

## 2017-07-02 DIAGNOSIS — R0602 Shortness of breath: Secondary | ICD-10-CM | POA: Diagnosis not present

## 2017-07-02 DIAGNOSIS — R0789 Other chest pain: Secondary | ICD-10-CM | POA: Insufficient documentation

## 2017-07-02 DIAGNOSIS — Z79899 Other long term (current) drug therapy: Secondary | ICD-10-CM | POA: Insufficient documentation

## 2017-07-02 DIAGNOSIS — R079 Chest pain, unspecified: Secondary | ICD-10-CM | POA: Diagnosis present

## 2017-07-02 LAB — CBC WITH DIFFERENTIAL/PLATELET
Basophils Absolute: 0 10*3/uL (ref 0.0–0.1)
Basophils Relative: 0 %
Eosinophils Absolute: 0.5 10*3/uL (ref 0.0–0.7)
Eosinophils Relative: 4 %
HEMATOCRIT: 40.3 % (ref 36.0–46.0)
Hemoglobin: 13.5 g/dL (ref 12.0–15.0)
LYMPHS ABS: 5.7 10*3/uL (ref 0.7–4.0)
Lymphocytes Relative: 49 %
MCH: 31.5 pg (ref 26.0–34.0)
MCHC: 33.5 g/dL (ref 30.0–36.0)
MCV: 93.9 fL (ref 78.0–100.0)
MONOS PCT: 9 %
Monocytes Absolute: 1.1 10*3/uL (ref 0.1–1.0)
NEUTROS ABS: 4.6 10*3/uL (ref 1.7–7.7)
NEUTROS PCT: 38 %
Platelets: 349 10*3/uL (ref 150–400)
RBC: 4.29 MIL/uL (ref 3.87–5.11)
RDW: 15.3 % (ref 11.5–15.5)
WBC: 11.9 10*3/uL — ABNORMAL HIGH (ref 4.0–10.5)

## 2017-07-02 LAB — BASIC METABOLIC PANEL
ANION GAP: 9 (ref 5–15)
BUN: 16 mg/dL (ref 6–20)
CHLORIDE: 105 mmol/L (ref 101–111)
CO2: 24 mmol/L (ref 22–32)
CREATININE: 0.75 mg/dL (ref 0.44–1.00)
Calcium: 9.2 mg/dL (ref 8.9–10.3)
GFR calc non Af Amer: >60 mL/min (ref >60)
Glucose, Bld: 92 mg/dL (ref 65–99)
POTASSIUM: 3.7 mmol/L (ref 3.5–5.1)
Sodium: 138 mmol/L (ref 135–145)

## 2017-07-02 LAB — TROPONIN I: Troponin I: <0.03 ng/mL (ref <0.03)

## 2017-07-02 LAB — PREGNANCY, URINE: Preg Test, Ur: NEGATIVE

## 2017-07-02 LAB — D-DIMER, QUANTITATIVE (NOT AT ARMC): D DIMER QUANT: 0.6 ug{FEU}/mL — AB (ref 0.00–0.50)

## 2017-07-02 MED ORDER — SODIUM CHLORIDE 0.9 % IV BOLUS
1000.0000 mL | Freq: Once | INTRAVENOUS | Status: AC
  Administered 2017-07-02: 1000 mL via INTRAVENOUS

## 2017-07-02 MED ORDER — KETOROLAC TROMETHAMINE 30 MG/ML IJ SOLN
30.0000 mg | Freq: Once | INTRAMUSCULAR | Status: AC
  Administered 2017-07-02: 30 mg via INTRAVENOUS
  Filled 2017-07-02: qty 1

## 2017-07-02 NOTE — ED Notes (Signed)
Cough that started tonight with HA. No cough noted on assessment.

## 2017-07-02 NOTE — ED Triage Notes (Signed)
Cough and pain in her chest with the cough for a week.

## 2017-07-02 NOTE — ED Provider Notes (Signed)
[facility] EMERGENCY DEPARTMENT Provider Note   CSN: [id number] Arrival date & time: 07/02/17  2109     History   Chief Complaint Chief Complaint  Patient presents with  . Chest Pain    HPI Carol [name] is a 22 y.o. female with PMH/o portal vein thrombosis after being on OCPs who presents for evaluation of 1 week of chest pain.  Patient reports that she has had a midsternal chest pain that she describes as a "squeezing and tightening" sensation that wraps around to her back.  She states that the pain is worse on the left side.  She states that the pain is worse with deep inspiration and she does report that she has been having some shortness of breath associated with the pain.  Patient reports that she has been taking ibuprofen for the pain but denies any improvement in symptoms.  Patient states she has not had any associated nausea, vomiting, diaphoresis.  Patient has not had any leg swelling.  Patient reports that she started developing a cough today.  Cough is nonproductive.  Patient states that she does have a history of portal vein thrombosis after being on OCPs.  Patient reports that the day before her symptoms started, she took an over-the-counter emergency contraceptive pill.  Patient denies any fevers, abdominal pain, vomiting, urinary complaints.  Patient denies any recent surgeries, hospitalizations, long plane, car rides.  The history is provided by the patient.    Past Medical History:  Diagnosis Date  . Budd-Chiari syndrome (HCC)   . Other family problem    Sibling has Antiphospholipid Syndrome  . Portal vein thrombosis 4/16   R portal vein blood clot    Patient Active Problem List   Diagnosis Date Noted  . Right ankle injury 11/15/2015  . CMV mononucleosis 11/27/2014  . Cytomegalovirus (HCC)   . Septic thrombophlebitis   . 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 Normocytic anemia 07/21/2014  . Obesity  07/21/2014  . Elevated liver enzymes 07/21/2014  . Rash 07/21/2014  . Sepsis (HCC) 07/20/2014  . Leukocytosis 07/20/2014  . Nausea and vomiting 07/20/2014  . Blood poisoning   . Abdominal pain 07/19/2014  . UTI (urinary tract infection) 07/19/2014  . Portal vein thrombosis 07/19/2014    Past Surgical History:  Procedure Laterality Date  . BACK SURGERY    . SPLENECTOMY, TOTAL       OB History   None      Home Medications    Prior to Admission medications   Medication Sig Start Date End Date Taking? Authorizing Provider  amphetamine-dextroamphetamine (ADDERALL) 20 MG tablet Take 20 mg by mouth 2 (two) times daily.    [provider]  diclofenac (VOLTAREN) 75 MG EC tablet Take 1 tablet (75 mg total) by mouth 2 (two) times daily. 11/09/15   Hudnall, Azucena Fallen, MD  HYDROcodone-acetaminophen (NORCO) 5-325 MG tablet Take 1 tablet by mouth every 6 (six) hours as needed for moderate pain. 11/09/15   Hudnall, Azucena Fallen, MD  naproxen (NAPROSYN) 250 MG tablet Take 1 tablet (250 mg total) by mouth 2 (two) times daily with a meal. 11/30/15   Everlene Farrier, PA-C  ondansetron (ZOFRAN) 4 MG tablet Take 1 tablet (4 mg total) by mouth every 6 (six) hours. 11/13/15   Cheri Fowler, PA-C  Pseudoeph-Doxylamine-DM-APAP (NYQUIL PO) Take by mouth.    [provider]  rivaroxaban (XARELTO) 20 MG TABS tablet Take 1 tablet (20 mg total) by mouth daily  with supper. 08/12/14 12/02/14  BoelterGwendolyn Fill, Heather F, NP    Family History No family history on file.  Social History Social History   Tobacco Use  . Smoking status: Never Smoker  . Smokeless tobacco: Never Used  Substance Use Topics  . Alcohol use: No    Alcohol/week: 0.0 oz  . Drug use: No     Allergies   Other and Tape   Review of Systems Review of Systems  Constitutional: Negative for chills and fever.  HENT: Negative for congestion.   Eyes: Negative for visual disturbance.  Respiratory: Positive for cough and shortness of  breath.   Cardiovascular: Positive for chest pain.  Gastrointestinal: Negative for abdominal pain, diarrhea, nausea and vomiting.  Genitourinary: Negative for dysuria and hematuria.  Neurological: Negative for dizziness, weakness, numbness and headaches.  All other systems reviewed and are negative.    Physical Exam Updated Vital Signs BP 115/73 (BP Location: Right Arm)   Pulse 65   Temp 99.2 F (37.3 C) (Oral)   Resp 13   Ht 4\' 11"  (1.499 m)   Wt 93.4 kg (206 lb)   LMP 06/03/2017   SpO2 100%   BMI 41.61 kg/m   Physical Exam  Constitutional: She is oriented to person, place, and time. She appears well-developed and well-nourished.  HENT:  Head: Normocephalic and atraumatic.  Mouth/Throat: Oropharynx is clear and moist and mucous membranes are normal.  Eyes: Pupils are equal, round, and reactive to light. Conjunctivae, EOM and lids are normal.  Neck: Full passive range of motion without pain.  Cardiovascular: Normal rate, regular rhythm, normal heart sounds and normal pulses. Exam reveals no gallop and no friction rub.  No murmur heard. Pulmonary/Chest: Effort normal and breath sounds normal.  No evidence of respiratory distress. Able to speak in full sentences without difficulty.  Pain is reproduced with palpation of the anterior chest wall.  No deformity or crepitus noted.  Pain is also reproduced with movement of the upper extremities bilaterally.  Lungs clear to auscultation bilaterally.  Abdominal: Soft. Normal appearance. There is no tenderness. There is no rigidity and no guarding.  Musculoskeletal: Normal range of motion.  Bilateral lower extremities are symmetric in appearance.  Neurological: She is alert and oriented to person, place, and time.  Skin: Skin is warm and dry. Capillary refill takes less than 2 seconds.  Psychiatric: She has a normal mood and affect. Her speech is normal.  Nursing note and vitals reviewed.    ED Treatments / Results  Labs (all labs  ordered are listed, but only abnormal results are displayed) Labs Reviewed  CBC WITH DIFFERENTIAL/PLATELET - Abnormal; Notable for the following components:      Result Value   WBC 11.9 (*)    All other components within normal limits  D-DIMER, QUANTITATIVE (NOT AT [facility]) - Abnormal; Notable for the following components:   D-Dimer, Quant 0.60 (*)    All other components within normal limits  PREGNANCY, URINE  BASIC METABOLIC PANEL  TROPONIN I    EKG None  Radiology Dg Chest 2 View  Result Date: 07/02/2017 CLINICAL DATA:  Chest pain EXAM: CHEST - 2 VIEW COMPARISON:  11/30/2015 FINDINGS: No acute pulmonary infiltrate or effusion. Normal heart size. No pneumothorax. Spinal rods and fixating screws. IMPRESSION: No active cardiopulmonary disease. Electronically Signed   By: Jasmine PangKim  Fujinaga M.D.   On: 07/02/2017 23:56    Procedures Procedures (including critical care time)  Medications Ordered in ED Medications  ketorolac (TORADOL) 30 MG/ML  injection 30 mg (30 mg Intravenous Given 07/02/17 2304)  sodium chloride 0.9 % bolus 1,000 mL (0 mLs Intravenous Stopped 07/02/17 2359)  enoxaparin (LOVENOX) injection 95 mg (95 mg Subcutaneous Given 07/03/17 0025)     Initial Impression / Assessment and Plan / ED Course  I have reviewed the triage vital signs and the nursing notes.  Pertinent labs & imaging results that were available during my care of the patient were reviewed by me and considered in my medical decision making (see chart for details).     22 year old female with past medical history of portal vein thrombosis who presents for evaluation of chest pain that began 1 week ago.  Patient reports that she took over-the-counter emergency contraceptive pill 1 day prior to onset of symptoms.  Patient reports history of portal vein thrombosis after being on OCPs.  Patient states that she has not had any fever, leg swelling. Patient is afebrile, non-toxic appearing, sitting comfortably on  examination table. Vital signs reviewed and stable.  Consider ACS etiology versus PE versus musculoskeletal pain versus acute infectious etiology.  Plan to check basic labs, EKG, chest x-ray.  EKG shows normal sinus rhythm, rate 64.  No evidence of ST elevation.  Chest x-ray negative for any acute infectious etiology.  Troponin negative.  BMP without any acute abnormalities.  Urine pregnancy negative.  CBC shows slight leukocytosis but otherwise no anemia or other abnormalities.  D-dimer is elevated.  Given patient's history and elevated d-dimer, she will need a CTA of her chest for evaluation of possible pulmonary embolism.  Currently at our facility, our CT scanner is broken and is not working.  Patient will need transfer to [facility] emergency department to get further treatment and evaluation for possible PE.  I discussed results with patient.  Patient is agreeable to transfer by POV to [facility] emergency department.  Discussed with Dr. Lynelle Doctor who is agreeable for excepting patient [facility] ED.  We will plan to give a dose of IV Lovenox prior to transfer to [facility] long ED.  Discussed with patient.  Instructed to go directly to the [facility] long emergency department for further evaluation. Patient had ample opportunity for questions and discussion. All patient's questions were answered with full understanding.   Final Clinical Impressions(s) / ED Diagnoses   Final diagnoses:  Chest pain, unspecified type  Shortness of breath    ED Discharge Orders    None       Rosana Hoes 07/03/17 0106    Rolland Porter, MD 07/08/17 2342

## 2017-07-03 ENCOUNTER — Emergency Department (HOSPITAL_COMMUNITY)

## 2017-07-03 ENCOUNTER — Encounter (HOSPITAL_COMMUNITY): Payer: Self-pay

## 2017-07-03 MED ORDER — IOPAMIDOL (ISOVUE-370) INJECTION 76%
INTRAVENOUS | Status: AC
  Filled 2017-07-03: qty 100

## 2017-07-03 MED ORDER — IOPAMIDOL (ISOVUE-370) INJECTION 76%
100.0000 mL | Freq: Once | INTRAVENOUS | Status: AC | PRN
  Administered 2017-07-03: 100 mL via INTRAVENOUS

## 2017-07-03 MED ORDER — ENOXAPARIN SODIUM 100 MG/ML ~~LOC~~ SOLN
1.0000 mg/kg | Freq: Once | SUBCUTANEOUS | Status: AC
  Administered 2017-07-03: 95 mg via SUBCUTANEOUS
  Filled 2017-07-03: qty 1

## 2017-07-03 MED ORDER — METHOCARBAMOL 500 MG PO TABS: ORAL_TABLET | ORAL | 0 refills | Status: AC

## 2017-07-03 MED ORDER — NAPROXEN 500 MG PO TABS: ORAL_TABLET | ORAL | 0 refills | Status: AC

## 2017-07-03 NOTE — Discharge Instructions (Addendum)
Use ice and heat for comfort. Take the medications as prescribed for your chest wall pain.  Recheck if you get a fever, cough, struggle to breathe.

## 2017-07-03 NOTE — ED Notes (Signed)
[facility]. Pt and family instructed on directions and to report directly to the ED without making any additional stops. Pt instructed to remain NPO, instructed not to tamper with IV. IV secured for transport.

## 2017-07-03 NOTE — ED Provider Notes (Signed)
Time seen 1:40 AM  Patient transferred to [facility] from [facility] to get CT scan because there is CT scanner is not working.  Patient reports a history of portal vein thrombosis about 3 years ago that was treated with a blood thinner for about 6 months.  She reports at that time she been on birth control pills.  She was on Lovenox and heparin when she was pregnant, her baby is currently 766 months old.  She was on blood thinners for 2-4 weeks after the birth of the baby.  She states she took a emergency contraceptive on March 5.  About a week ago she started having pain in the center of her chest and it wraps all the way around her chest that she describes as being sharp, dull, and achy and has been there constantly.  She denies shortness of breath, swelling or pain in her legs.  She relates her sister was recently diagnosed with some type of clotting disorder.  Physical exam was deferred to prior facility however patient is awake and alert and is in no respiratory distress.  Dg Chest 2 View  Result Date: 07/02/2017 CLINICAL DATA:  Chest pain EXAM: CHEST - 2 VIEW COMPARISON:  11/30/2015 FINDINGS: No acute pulmonary infiltrate or effusion. Normal heart size. No pneumothorax. Spinal rods and fixating screws. IMPRESSION: No active cardiopulmonary disease. Electronically Signed   By: Jasmine PangKim  Fujinaga M.D.   On: 07/02/2017 23:56   Ct Angio Chest Pe W And/or Wo Contrast  Result Date: 07/03/2017 CLINICAL DATA:  22 year old female with 1 week of midsternal chest pain greater on the left. Nonproductive cough. EXAM: CT ANGIOGRAPHY CHEST WITH CONTRAST TECHNIQUE: Multidetector CT imaging of the chest was performed using the standard protocol during bolus administration of intravenous contrast. Multiplanar CT image reconstructions and MIPs were obtained to evaluate the vascular anatomy. CONTRAST:  100mL ISOVUE-370 IOPAMIDOL (ISOVUE-370) INJECTION 76% COMPARISON:  Chest CTA 11/12/2015 and earlier.  FINDINGS: Cardiovascular: Adequate contrast bolus timing in the pulmonary arterial tree. No focal filling defect identified in the pulmonary arteries to suggest acute pulmonary embolism. No cardiomegaly or pericardial effusion.  Negative visible aorta. Mediastinum/Nodes: Negative.  No lymphadenopathy. Lungs/Pleura: Major airways are patent. Stable lung volumes. The lungs remain clear. No pleural effusion. Upper Abdomen: Stable and negative. Musculoskeletal: Mild scoliosis with chronic multilevel spinal pedicle screws and posterior connecting rods. Stable hardware. Stable visualized osseous structures. Review of the MIP images confirms the above findings. IMPRESSION: 1. Negative for acute pulmonary embolus. Stable and negative chest CTA since 2017. 2. Chronic scoliosis with stable appearance of posterior spinal hardware. Electronically Signed   By: Odessa FlemingH  Hall M.D.   On: 07/03/2017 02:12    Patient CTA is negative for acute pulmonary embolus.  She will be treated for musculoskeletal chest pain.  She states her chest is sore to touch.  We discussed her CT results and she is very relieved.  Diagnoses that have been ruled out:  None  Diagnoses that are still under consideration:  None  Final diagnoses:  Chest pain, unspecified type  Shortness of breath  Chest wall pain   ED Discharge Orders        Ordered    naproxen (NAPROSYN) 500 MG tablet     07/03/17 0222    methocarbamol (ROBAXIN) 500 MG tablet     07/03/17 0222      Plan discharge  Devoria AlbeIva Sherilee Smotherman, MD, Concha PyoFACEP    Laporche Martelle, MD 07/03/17 [phone]

## 2017-07-03 NOTE — ED Notes (Signed)
Bed: WLPT1 Expected date:  Expected time:  Means of arrival:  Comments: 

## 2017-07-03 NOTE — ED Notes (Signed)
Bed: WTR5 Expected date:  Expected time:  Means of arrival:  Comments: 

## 2017-07-03 NOTE — ED Notes (Signed)
Pt declined discharge vitals. She states, "no, I dont need them rechecked."

## 2018-10-31 ENCOUNTER — Encounter (HOSPITAL_COMMUNITY): Payer: Self-pay

## 2018-10-31 ENCOUNTER — Ambulatory Visit (HOSPITAL_COMMUNITY)
Admission: EM | Admit: 2018-10-31 | Discharge: 2018-10-31 | Disposition: A | Discharge status: Home / Self Care | Payer: Medicaid Other | Attending: Family Medicine | Admitting: Family Medicine

## 2018-10-31 ENCOUNTER — Other Ambulatory Visit: Payer: Self-pay

## 2018-10-31 DIAGNOSIS — R1084 Generalized abdominal pain: Secondary | ICD-10-CM | POA: Diagnosis not present

## 2018-10-31 DIAGNOSIS — K5900 Constipation, unspecified: Secondary | ICD-10-CM | POA: Diagnosis present

## 2018-10-31 DIAGNOSIS — K219 Gastro-esophageal reflux disease without esophagitis: Secondary | ICD-10-CM | POA: Diagnosis present

## 2018-10-31 LAB — COMPREHENSIVE METABOLIC PANEL
ALT: 35 U/L (ref 0–44)
AST: 35 U/L (ref 15–41)
Albumin: 4.1 g/dL (ref 3.5–5.0)
Alkaline Phosphatase: 73 U/L (ref 38–126)
Anion gap: 11 (ref 5–15)
BUN: 11 mg/dL (ref 6–20)
CO2: 24 mmol/L (ref 22–32)
Calcium: 10.1 mg/dL (ref 8.9–10.3)
Chloride: 107 mmol/L (ref 98–111)
Creatinine, Ser: 0.78 mg/dL (ref 0.44–1.00)
GFR calc Af Amer: >60 mL/min (ref >60)
GFR calc non Af Amer: >60 mL/min (ref >60)
Glucose, Bld: 82 mg/dL (ref 70–99)
Potassium: 3.9 mmol/L (ref 3.5–5.1)
Sodium: 142 mmol/L (ref 135–145)
Total Bilirubin: 0.7 mg/dL (ref 0.3–1.2)
Total Protein: 7.3 g/dL (ref 6.5–8.1)

## 2018-10-31 LAB — CBC WITH DIFFERENTIAL/PLATELET
Abs Immature Granulocytes: 0.02 10*3/uL (ref 0.00–0.07)
Basophils Absolute: 0.1 10*3/uL (ref 0.0–0.1)
Basophils Relative: 1 %
Eosinophils Absolute: 0.4 10*3/uL (ref 0.0–0.5)
Eosinophils Relative: 4 %
HCT: 42.2 % (ref 36.0–46.0)
Hemoglobin: 13.9 g/dL (ref 12.0–15.0)
Immature Granulocytes: 0 %
Lymphocytes Relative: 45 %
Lymphs Abs: 4.6 10*3/uL — ABNORMAL HIGH (ref 0.7–4.0)
MCH: 31.2 pg (ref 26.0–34.0)
MCHC: 32.9 g/dL (ref 30.0–36.0)
MCV: 94.8 fL (ref 80.0–100.0)
Monocytes Absolute: 0.8 10*3/uL (ref 0.1–1.0)
Monocytes Relative: 8 %
Neutro Abs: 4.2 10*3/uL (ref 1.7–7.7)
Neutrophils Relative %: 42 %
Platelets: 335 10*3/uL (ref 150–400)
RBC: 4.45 MIL/uL (ref 3.87–5.11)
RDW: 14 % (ref 11.5–15.5)
WBC: 10.1 10*3/uL (ref 4.0–10.5)
nRBC: 0 % (ref 0.0–0.2)

## 2018-10-31 LAB — POCT URINALYSIS DIP (DEVICE)
Bilirubin Urine: NEGATIVE
Glucose, UA: NEGATIVE mg/dL
Hgb urine dipstick: NEGATIVE
Ketones, ur: NEGATIVE mg/dL
Leukocytes,Ua: NEGATIVE
Nitrite: NEGATIVE
Protein, ur: NEGATIVE mg/dL
Specific Gravity, Urine: >=1.03 (ref 1.005–1.030)
Urobilinogen, UA: 0.2 mg/dL (ref 0.0–1.0)
pH: 6.5 (ref 5.0–8.0)

## 2018-10-31 LAB — LIPASE, BLOOD: Lipase: 29 U/L (ref 11–51)

## 2018-10-31 LAB — POCT PREGNANCY, URINE: Preg Test, Ur: NEGATIVE

## 2018-10-31 MED ORDER — LIDOCAINE VISCOUS HCL 2 % MT SOLN
OROMUCOSAL | Status: AC
  Filled 2018-10-31: qty 15

## 2018-10-31 MED ORDER — POLYETHYLENE GLYCOL 3350 17 GM/SCOOP PO POWD: 17.0000 g | Freq: Every day | ORAL | 0 refills | Status: AC

## 2018-10-31 MED ORDER — ALUM & MAG HYDROXIDE-SIMETH 200-200-20 MG/5ML PO SUSP
30.0000 mL | Freq: Once | ORAL | Status: AC
  Administered 2018-10-31: 30 mL via ORAL

## 2018-10-31 MED ORDER — OMEPRAZOLE 40 MG PO CPDR: 40.0000 mg | DELAYED_RELEASE_CAPSULE | Freq: Every day | ORAL | 0 refills | Status: AC

## 2018-10-31 MED ORDER — LIDOCAINE VISCOUS HCL 2 % MT SOLN
15.0000 mL | Freq: Once | OROMUCOSAL | Status: AC
  Administered 2018-10-31: 15 mL via ORAL

## 2018-10-31 MED ORDER — ALUM & MAG HYDROXIDE-SIMETH 200-200-20 MG/5ML PO SUSP
ORAL | Status: AC
  Filled 2018-10-31: qty 30

## 2018-10-31 NOTE — ED Triage Notes (Signed)
Pt presents with generalized abdominal pain that radiates around to her back, central chest pain, and headache.  Pt states she has a a blood clot in her liver 5 years ago.

## 2018-10-31 NOTE — Discharge Instructions (Signed)
Please start daily omeprazole.  Daily miralax to promote regular bowel movements.  See provided information and diet recommendations about gerd.  Avoid anti-inflammatories such as aleve or ibuprofen as this can trigger symptoms. Will notify you of any positive findings from your lab tests and if any changes to treatment are needed.   Continue to follow with your primary care provider if symptoms persist.  Please go to the ER if worsening of symptoms.

## 2018-10-31 NOTE — ED Provider Notes (Signed)
[facility]    CSN: [id number] Arrival date & time: 10/31/18  1415      History   Chief Complaint Chief Complaint  Patient presents with  . Abdominal Pain  . Back Pain  . Headache    HPI Carol [name] is a 23 y.o. female.   Carol [name] presents with complaints of abdominal pain. Burning. Upper abdomen primarily but at times also low abdomen. Started 2-3 days ago. Waxes and wanes. Nausea, no vomiting. Worse with eating. Woke this morning and felt better so drank coffee and ate an egg mcmuffin, symptoms worsened. Tried taking aleve which didn't help. Constipation, no diarrhea. No fevers. No urinary symptoms. States she had similar pain when she had a protal vein thrombosis, was on birth control at the time. No longer on birth control. Hx of gerd, uses tums prn but hasn't taken regularly. At times the pain wraps around to her back or causes chest pain. No shortness of breath . No cough. Hx of budd-chiari syndrome, splenectomy.    ROS per HPI, negative if not otherwise mentioned.      Past Medical History:  Diagnosis Date  . Budd-Chiari syndrome (HCC)   . Other family problem    Sibling has Antiphospholipid Syndrome  . Portal vein thrombosis 4/16   R portal vein blood clot    Patient Active Problem List   Diagnosis Date Noted  . Right ankle injury 11/15/2015  . CMV mononucleosis 11/27/2014  . Cytomegalovirus (HCC)   . Septic thrombophlebitis   . 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 Normocytic anemia 07/21/2014  . Obesity 07/21/2014  . Elevated liver enzymes 07/21/2014  . Rash 07/21/2014  . Sepsis (HCC) 07/20/2014  . Leukocytosis 07/20/2014  . Nausea and vomiting 07/20/2014  . Blood poisoning   . Abdominal pain 07/19/2014  . UTI (urinary tract infection) 07/19/2014  . Portal vein thrombosis 07/19/2014    Past Surgical History:  Procedure Laterality Date  . BACK SURGERY    . SPLENECTOMY, TOTAL      OB History   No obstetric history on file.      Home Medications    Prior to Admission medications   Medication Sig Start Date End Date Taking? Authorizing Provider  diclofenac (VOLTAREN) 75 MG EC tablet Take 1 tablet (75 mg total) by mouth 2 (two) times daily. Patient not taking: Reported on 07/03/2017 11/09/15   Lenda KelpHudnall, Shane R, MD  HYDROcodone-acetaminophen (NORCO) 5-325 MG tablet Take 1 tablet by mouth every 6 (six) hours as needed for moderate pain. Patient not taking: Reported on 07/03/2017 11/09/15   Lenda KelpHudnall, Shane R, MD  methocarbamol (ROBAXIN) 500 MG tablet Take 1 or 2 po Q 6hrs for pain 07/03/17   Devoria AlbeKnapp, Iva, MD  naproxen (NAPROSYN) 500 MG tablet Take 1 po BID with food prn pain 07/03/17   Devoria AlbeKnapp, Iva, MD  omeprazole (PRILOSEC) 40 MG capsule Take 1 capsule (40 mg total) by mouth daily. 10/31/18   Georgetta HaberBurky, Natalie B, NP  ondansetron (ZOFRAN) 4 MG tablet Take 1 tablet (4 mg total) by mouth every 6 (six) hours. Patient not taking: Reported on 07/03/2017 11/13/15   Cheri Fowlerose, Kayla, PA-C  polyethylene glycol powder (GLYCOLAX/MIRALAX) 17 GM/SCOOP powder Take 17 g by mouth daily. 10/31/18   Georgetta HaberBurky, Natalie B, NP    Family History Family History  Family history unknown: Yes    Social History Social History   Tobacco Use  . Smoking status: Never Smoker  . Smokeless tobacco:  Never Used  Substance Use Topics  . Alcohol use: No    Alcohol/week: 0.0 standard drinks  . Drug use: No     Allergies   Other and Tape   Review of Systems Review of Systems   Physical Exam Triage Vital Signs ED Triage Vitals  Enc Vitals Group     BP 10/31/18 1442 135/75     Pulse Rate 10/31/18 1442 63     Resp 10/31/18 1442 18     Temp 10/31/18 1442 98.5 F (36.9 C)     Temp Source 10/31/18 1442 Oral     SpO2 10/31/18 1442 100 %     Weight --      Height --      Head Circumference --      Peak Flow --      Pain Score 10/31/18 1444 9     Pain Loc --      Pain Edu? --      Excl. in [place]? --    No  data found.  Updated Vital Signs BP 135/75 (BP Location: Left Arm)   Pulse 63   Temp 98.5 F (36.9 C) (Oral)   Resp 18   LMP 10/13/2018   SpO2 100%   Physical Exam Constitutional:      General: She is not in acute distress.    Appearance: She is well-developed.  Cardiovascular:     Rate and Rhythm: Normal rate.     Heart sounds: Normal heart sounds.  Pulmonary:     Effort: Pulmonary effort is normal.  Abdominal:     Palpations: Abdomen is soft.     Tenderness: There is abdominal tenderness in the epigastric area, periumbilical area and left lower quadrant. There is no right CVA tenderness, left CVA tenderness, guarding or rebound.  Skin:    General: Skin is warm and dry.  Neurological:     Mental Status: She is alert and oriented to person, place, and time.      UC Treatments / Results  Labs (all labs ordered are listed, but only abnormal results are displayed) Labs Reviewed  CBC WITH DIFFERENTIAL/PLATELET  COMPREHENSIVE METABOLIC PANEL  LIPASE, BLOOD  POC URINE PREG, ED  POCT URINALYSIS DIP (DEVICE)  POCT PREGNANCY, URINE    EKG   Radiology No results found.  Procedures Procedures (including critical care time)  Medications Ordered in UC Medications  alum & mag hydroxide-simeth (MAALOX/MYLANTA) 200-200-20 MG/5ML suspension 30 mL (has no administration in time range)    And  lidocaine (XYLOCAINE) 2 % viscous mouth solution 15 mL (has no administration in time range)    Initial Impression / Assessment and Plan / UC Course  I have reviewed the triage vital signs and the nursing notes.  Pertinent labs & imaging results that were available during my care of the patient were reviewed by me and considered in my medical decision making (see chart for details).     History and exam consistent with gerd and constipation symptoms. Gi cocktail provided here today with basic labs and lipase collected. Diet and prevention discussed. Er precautions provided.  Patient verbalized understanding and agreeable to plan.   Final Clinical Impressions(s) / UC Diagnoses   Final diagnoses:  Generalized abdominal pain  Gastroesophageal reflux disease, esophagitis presence not specified  Constipation, unspecified constipation type     Discharge Instructions     Please start daily omeprazole.  Daily miralax to promote regular bowel movements.  See provided information and diet recommendations about gerd.  Avoid anti-inflammatories such as aleve or ibuprofen as this can trigger symptoms. Will notify you of any positive findings from your lab tests and if any changes to treatment are needed.   Continue to follow with your primary care provider if symptoms persist.  Please go to the ER if worsening of symptoms.    ED Prescriptions    Medication Sig Dispense Auth. Provider   omeprazole (PRILOSEC) 40 MG capsule Take 1 capsule (40 mg total) by mouth daily. 30 capsule Linus MakoBurky, Natalie B, NP   polyethylene glycol powder (GLYCOLAX/MIRALAX) 17 GM/SCOOP powder Take 17 g by mouth daily. 255 g Georgetta HaberBurky, Natalie B, NP     Controlled Substance Prescriptions [place] Controlled Substance Registry consulted? Not Applicable   Georgetta HaberBurky, Natalie B, NP 10/31/18 1539

## 2018-11-03 ENCOUNTER — Encounter (HOSPITAL_COMMUNITY): Payer: Self-pay

## 2019-01-20 ENCOUNTER — Emergency Department (HOSPITAL_BASED_OUTPATIENT_CLINIC_OR_DEPARTMENT_OTHER): Payer: Medicaid Other

## 2019-01-20 ENCOUNTER — Emergency Department (HOSPITAL_BASED_OUTPATIENT_CLINIC_OR_DEPARTMENT_OTHER)
Admission: EM | Admit: 2019-01-20 | Discharge: 2019-01-20 | Disposition: A | Discharge status: Home / Self Care | Payer: Medicaid Other | Attending: Emergency Medicine | Admitting: Emergency Medicine

## 2019-01-20 ENCOUNTER — Other Ambulatory Visit: Payer: Self-pay

## 2019-01-20 ENCOUNTER — Encounter (HOSPITAL_BASED_OUTPATIENT_CLINIC_OR_DEPARTMENT_OTHER): Payer: Self-pay

## 2019-01-20 DIAGNOSIS — Z79899 Other long term (current) drug therapy: Secondary | ICD-10-CM | POA: Insufficient documentation

## 2019-01-20 DIAGNOSIS — R058 Other specified cough: Secondary | ICD-10-CM

## 2019-01-20 DIAGNOSIS — M791 Myalgia, unspecified site: Secondary | ICD-10-CM | POA: Insufficient documentation

## 2019-01-20 DIAGNOSIS — J069 Acute upper respiratory infection, unspecified: Secondary | ICD-10-CM

## 2019-01-20 DIAGNOSIS — R05 Cough: Secondary | ICD-10-CM

## 2019-01-20 HISTORY — DX: Gastro-esophageal reflux disease without esophagitis: K21.9

## 2019-01-20 LAB — URINALYSIS, ROUTINE W REFLEX MICROSCOPIC
Bilirubin Urine: NEGATIVE
Glucose, UA: NEGATIVE mg/dL
Hgb urine dipstick: NEGATIVE
Ketones, ur: NEGATIVE mg/dL
Leukocytes,Ua: NEGATIVE
Nitrite: NEGATIVE
Protein, ur: NEGATIVE mg/dL
Specific Gravity, Urine: >1.03 — ABNORMAL HIGH (ref 1.005–1.030)
pH: 6 (ref 5.0–8.0)

## 2019-01-20 LAB — PREGNANCY, URINE: Preg Test, Ur: NEGATIVE

## 2019-01-20 MED ORDER — BENZONATATE 100 MG PO CAPS: 100.0000 mg | ORAL_CAPSULE | Freq: Three times a day (TID) | ORAL | 0 refills | Status: AC

## 2019-01-20 MED ORDER — PREDNISONE 50 MG PO TABS
60.0000 mg | ORAL_TABLET | Freq: Once | ORAL | Status: AC
  Administered 2019-01-20: 60 mg via ORAL
  Filled 2019-01-20: qty 1

## 2019-01-20 MED ORDER — ALBUTEROL SULFATE HFA 108 (90 BASE) MCG/ACT IN AERS
2.0000 | INHALATION_SPRAY | Freq: Once | RESPIRATORY_TRACT | Status: AC
  Administered 2019-01-20: 2 via RESPIRATORY_TRACT
  Filled 2019-01-20: qty 6.7

## 2019-01-20 MED ORDER — PREDNISONE 10 MG PO TABS: 40.0000 mg | ORAL_TABLET | Freq: Every day | ORAL | 0 refills | Status: AC

## 2019-01-20 MED ORDER — PROMETHAZINE HCL 25 MG PO TABS
25.0000 mg | ORAL_TABLET | Freq: Once | ORAL | Status: AC
  Administered 2019-01-20: 22:00:00 25 mg via ORAL
  Filled 2019-01-20: qty 1

## 2019-01-20 NOTE — ED Triage Notes (Addendum)
Pt entered triage with c/o "pain in my lung" then points to right lower back for pain site then states pain is "my whole right side" x 2 weeks-denies injury-reports mild cough-denies sore throat-reports neg covid test last week-NAD-steady gait

## 2019-01-20 NOTE — ED Notes (Signed)
Pt. Ambulated through halls. Sats stayed between 98 and 100%. Gait was ggood. Pt. Complained of slight SOB upon returning to room and a headache.

## 2019-01-20 NOTE — Discharge Instructions (Addendum)
Use albuterol 1 to 2 puffs every 4-6 hours as needed for shortness of breath.  Take prednisone as prescribed beginning tomorrow; you received the first dose in the emergency department today.  Take Tessalon as needed for cough.  Drink plenty fluids and plenty of rest.  Avoid ibuprofen while you are taking the prednisone but you can take 1 to 2 tablets of Tylenol every 6 hours as needed for pain or fever.   Continue to quarantine at home per current CDC guidelines.  Follow-up with primary care provider for reevaluation of symptoms if they persist.  Return to the emergency department if any concerning signs or symptoms develop such as severe worsening shortness of breath, high fevers, persistent vomiting, loss of consciousness.

## 2019-01-20 NOTE — ED Provider Notes (Signed)
[facility] EMERGENCY DEPARTMENT Provider Note   CSN: [id number] Arrival date & time: 01/20/19  2003     History   Chief Complaint Chief Complaint  Patient presents with  . Back Pain    HPI Carol [name] is a 23 y.o. female with history of Budd-Chiari syndrome, GERD presents for evaluation of acute onset, persistent myalgias, cough, right-sided pains for 1-1/2 weeks.  Reports symptoms began 2 Fridays ago.  She has a nonproductive cough, mild nasal congestion, wakes up in the mornings with a sore throat.  She reports sharp pain to her entire right side from the neck down to her low back which worsens with certain movements.  Pain will radiate into the right upper extremity as well but she denies any numbness or weakness.  Nausea but no vomiting or urinary symptoms.  Reports she feels short of breath "all the time", no aggravating or alleviating factors noted.  Right sided chest pains with movement, palpation, or cough. She has been taking Tylenol that relief of symptoms.  Has had multiple sick contacts with similar symptoms but none of them have been tested for COVID.  She went to her PCP last Friday, 01/16/2019, was started on Augmentin despite documentation that the provider thought her symptoms were likely viral in etiology.  She also underwent COVID test which was negative.  She is a non-smoker.     The history is provided by the patient.    Past Medical History:  Diagnosis Date  . Budd-Chiari syndrome (HCC)   . GERD (gastroesophageal reflux disease)   . Other family problem    Sibling has Antiphospholipid Syndrome  . Portal vein thrombosis 4/16   R portal vein blood clot    Patient Active Problem List   Diagnosis Date Noted  . Right ankle injury 11/15/2015  . CMV mononucleosis 11/27/2014  . Cytomegalovirus (HCC)   . Septic thrombophlebitis   . 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 Normocytic anemia 07/21/2014  .  Obesity 07/21/2014  . Elevated liver enzymes 07/21/2014  . Rash 07/21/2014  . Sepsis (HCC) 07/20/2014  . Leukocytosis 07/20/2014  . Nausea and vomiting 07/20/2014  . Blood poisoning   . Abdominal pain 07/19/2014  . UTI (urinary tract infection) 07/19/2014  . Portal vein thrombosis 07/19/2014    Past Surgical History:  Procedure Laterality Date  . BACK SURGERY    . SPLENECTOMY, TOTAL       OB History   No obstetric history on file.      Home Medications    Prior to Admission medications   Medication Sig Start Date End Date Taking? Authorizing Provider  benzonatate (TESSALON) 100 MG capsule Take 1 capsule (100 mg total) by mouth every 8 (eight) hours. 01/20/19   Cosimo Schertzer A, PA-C  diclofenac (VOLTAREN) 75 MG EC tablet Take 1 tablet (75 mg total) by mouth 2 (two) times daily. Patient not taking: Reported on 07/03/2017 11/09/15   Lenda KelpHudnall, Shane R, MD  HYDROcodone-acetaminophen (NORCO) 5-325 MG tablet Take 1 tablet by mouth every 6 (six) hours as needed for moderate pain. Patient not taking: Reported on 07/03/2017 11/09/15   Lenda KelpHudnall, Shane R, MD  methocarbamol (ROBAXIN) 500 MG tablet Take 1 or 2 po Q 6hrs for pain 07/03/17   Devoria AlbeKnapp, Iva, MD  naproxen (NAPROSYN) 500 MG tablet Take 1 po BID with food prn pain 07/03/17   Devoria AlbeKnapp, Iva, MD  omeprazole (PRILOSEC) 40 MG capsule Take 1 capsule (40 mg total) by mouth  daily. 10/31/18   Zigmund Gottron, NP  ondansetron (ZOFRAN) 4 MG tablet Take 1 tablet (4 mg total) by mouth every 6 (six) hours. Patient not taking: Reported on 07/03/2017 11/13/15   Gloriann Loan, PA-C  polyethylene glycol powder (GLYCOLAX/MIRALAX) 17 GM/SCOOP powder Take 17 g by mouth daily. 10/31/18   Zigmund Gottron, NP  predniSONE (DELTASONE) 10 MG tablet Take 4 tablets (40 mg total) by mouth daily with breakfast for 5 days. 01/20/19 01/25/19  Renita Papa, PA-C    Family History Family History  Family history unknown: Yes    Social History Social History   Tobacco Use  .  Smoking status: Never Smoker  . Smokeless tobacco: Never Used  Substance Use Topics  . Alcohol use: No    Alcohol/week: 0.0 standard drinks  . Drug use: No     Allergies   Latex, Other, Tape, and Zofran [ondansetron hcl]   Review of Systems Review of Systems  Constitutional: Negative for chills and fever.  HENT: Positive for congestion and sore throat. Negative for trouble swallowing.   Respiratory: Positive for cough and shortness of breath.   Cardiovascular: Negative for chest pain.  Gastrointestinal: Positive for nausea. Negative for abdominal pain and vomiting.  Genitourinary: Negative for dysuria, frequency, hematuria and urgency.  Musculoskeletal: Positive for back pain and myalgias.  Neurological: Negative for weakness and numbness.  All other systems reviewed and are negative.    Physical Exam Updated Vital Signs BP 132/81 (BP Location: Right Arm)   Pulse 84   Temp 98.6 F (37 C)   Resp 18   Ht 4\' 11"  (1.499 m)   Wt 88.5 kg   LMP 12/22/2018   SpO2 98%   BMI 39.39 kg/m   Physical Exam Vitals signs and nursing note reviewed.  Constitutional:      General: She is not in acute distress.    Appearance: She is well-developed.  HENT:     Head: Normocephalic and atraumatic.     Right Ear: Tympanic membrane normal.     Left Ear: Tympanic membrane normal.     Nose: Congestion present.     Mouth/Throat:     Mouth: Mucous membranes are moist.     Pharynx: No posterior oropharyngeal erythema.     Comments: Tolerating secretions without difficulty, no abnormal phonation.  No trismus or sublingual abnormalities Eyes:     General:        Right eye: No discharge.        Left eye: No discharge.     Conjunctiva/sclera: Conjunctivae normal.  Neck:     Vascular: No JVD.     Trachea: No tracheal deviation.  Cardiovascular:     Rate and Rhythm: Normal rate and regular rhythm.  Pulmonary:     Effort: Pulmonary effort is normal.     Comments: Right lateral and  posterior chest wall tenderness with no deformity, crepitus, ecchymosis, or flail segment.  Speaking in full sentences without difficulty, SPO2 saturations 97% on room air. Chest:     Chest wall: Tenderness present.  Abdominal:     General: Bowel sounds are normal. There is no distension.     Palpations: Abdomen is soft.     Tenderness: There is no abdominal tenderness. There is right CVA tenderness. There is no left CVA tenderness, guarding or rebound.  Skin:    General: Skin is warm and dry.     Findings: No erythema.  Neurological:     Mental Status: She is  alert.  Psychiatric:        Behavior: Behavior normal.      ED Treatments / Results  Labs (all labs ordered are listed, but only abnormal results are displayed) Labs Reviewed  URINALYSIS, ROUTINE W REFLEX MICROSCOPIC - Abnormal; Notable for the following components:      Result Value   APPearance HAZY (*)    Specific Gravity, Urine >1.030 (*)    All other components within normal limits  PREGNANCY, URINE    EKG None  Radiology Dg Chest Port 1 View  Result Date: 01/20/2019 CLINICAL DATA:  Chest pain EXAM: PORTABLE CHEST 1 VIEW COMPARISON:  July 02, 2017 FINDINGS: No edema or consolidation. Heart size and pulmonary vascularity are normal. No adenopathy. No pneumothorax. Postoperative change noted in the thoracic region. There is a degree of thoracic dextroscoliosis. IMPRESSION: No edema or consolidation.  Stable cardiac silhouette. Electronically Signed   By: Bretta Bang III M.D.   On: 01/20/2019 21:48    Procedures Procedures (including critical care time)  Medications Ordered in ED Medications  predniSONE (DELTASONE) tablet 60 mg (60 mg Oral Given 01/20/19 2137)  promethazine (PHENERGAN) tablet 25 mg (25 mg Oral Given 01/20/19 2137)  albuterol (VENTOLIN HFA) 108 (90 Base) MCG/ACT inhaler 2 puff (2 puffs Inhalation Given 01/20/19 2249)     Initial Impression / Assessment and Plan / ED Course  I have  reviewed the triage vital signs and the nursing notes.  Pertinent labs & imaging results that were available during my care of the patient were reviewed by me and considered in my medical decision making (see chart for details).        Carol [name] was evaluated in Emergency Department on 01/21/2019 for the symptoms described in the history of present illness. She was evaluated in the context of the global COVID-19 pandemic, which necessitated consideration that the patient might be at risk for infection with the SARS-CoV-2 virus that causes COVID-19. Institutional protocols and algorithms that pertain to the evaluation of patients at risk for COVID-19 are in a state of rapid change based on information released by regulatory bodies including the CDC and federal and state organizations. These policies and algorithms were followed during the patient's care in the ED.  Patient presenting for evaluation of right-sided body pains as well as upper respiratory symptoms for 2 weeks.  She is afebrile, vital signs are stable.  She is nontoxic in appearance.  No evidence of respiratory distress, speaking in full sentences without difficulty.  She was ambulated in the ED with stable SPO2 saturations.  Chest x-ray here shows no evidence of acute cardiopulmonary abnormalities with no evidence of pneumonia or pleural effusion.  UA shows evidence of mild dehydration with mildly elevated specific gravity but no evidence of UTI or nephrolithiasis.  I have a low suspicion of PE as her pain is reproducible on palpation.  Symptoms also do not sound cardiac in etiology, doubt ACS/MI.  No evidence of strep pharyngitis, peritonsillar abscess, or deep space neck infection.  She tested negative for COVID-19 last week.  Discussed symptomatic management, will discharge with albuterol inhaler and prednisone burst.  Also discussed quarantining at home per current CDC guidelines.  Recommend follow-up with PCP for reevaluation of  symptoms.  Discussed strict ED return precautions. Patient verbalized understanding of and agreement with plan and is safe for discharge home at this time.   Final Clinical Impressions(s) / ED Diagnoses   Final diagnoses:  Viral URI with cough  Myalgia  ED Discharge Orders         Ordered    benzonatate (TESSALON) 100 MG capsule  Every 8 hours     01/20/19 2314    predniSONE (DELTASONE) 10 MG tablet  Daily with breakfast     01/20/19 2314           Jeanie Sewer, PA-C 01/21/19 1615    Melene Plan, DO 01/21/19 1830

## 2019-01-31 ENCOUNTER — Emergency Department: Payer: Medicaid Other

## 2019-01-31 ENCOUNTER — Other Ambulatory Visit: Payer: Self-pay

## 2019-01-31 ENCOUNTER — Emergency Department
Admission: EM | Admit: 2019-01-31 | Discharge: 2019-01-31 | Disposition: A | Discharge status: Home / Self Care | Payer: Medicaid Other | Attending: Emergency Medicine | Admitting: Emergency Medicine

## 2019-01-31 ENCOUNTER — Encounter: Payer: Self-pay | Admitting: Emergency Medicine

## 2019-01-31 DIAGNOSIS — M25511 Pain in right shoulder: Secondary | ICD-10-CM | POA: Diagnosis present

## 2019-01-31 DIAGNOSIS — Y939 Activity, unspecified: Secondary | ICD-10-CM | POA: Insufficient documentation

## 2019-01-31 DIAGNOSIS — Y92003 Bedroom of unspecified non-institutional (private) residence as the place of occurrence of the external cause: Secondary | ICD-10-CM | POA: Diagnosis not present

## 2019-01-31 DIAGNOSIS — Y999 Unspecified external cause status: Secondary | ICD-10-CM | POA: Diagnosis not present

## 2019-01-31 DIAGNOSIS — Z79899 Other long term (current) drug therapy: Secondary | ICD-10-CM | POA: Diagnosis not present

## 2019-01-31 DIAGNOSIS — Z91040 Latex allergy status: Secondary | ICD-10-CM | POA: Diagnosis not present

## 2019-01-31 MED ORDER — KETOROLAC TROMETHAMINE 30 MG/ML IJ SOLN
30.0000 mg | Freq: Once | INTRAMUSCULAR | Status: AC
  Administered 2019-01-31: 30 mg via INTRAMUSCULAR
  Filled 2019-01-31: qty 1

## 2019-01-31 MED ORDER — MELOXICAM 15 MG PO TABS: 15.0000 mg | ORAL_TABLET | Freq: Every day | ORAL | 1 refills | Status: AC
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
## 2019-01-31 NOTE — ED Notes (Signed)
Patient transported to X-ray 
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
